# Patient Record
Sex: Female | Born: 1975
Health system: Southern US, Community
[De-identification: ages and names within clinical notes are randomized; demographics above are authoritative.]

## PROBLEM LIST (undated history)

## (undated) DIAGNOSIS — F419 Anxiety disorder, unspecified: Secondary | ICD-10-CM

## (undated) DIAGNOSIS — R002 Palpitations: Secondary | ICD-10-CM

## (undated) DIAGNOSIS — H9192 Unspecified hearing loss, left ear: Secondary | ICD-10-CM

## (undated) DIAGNOSIS — G5600 Carpal tunnel syndrome, unspecified upper limb: Secondary | ICD-10-CM

## (undated) DIAGNOSIS — F329 Major depressive disorder, single episode, unspecified: Secondary | ICD-10-CM

## (undated) DIAGNOSIS — F32A Depression, unspecified: Secondary | ICD-10-CM

## (undated) DIAGNOSIS — G250 Essential tremor: Secondary | ICD-10-CM

## (undated) DIAGNOSIS — M1990 Unspecified osteoarthritis, unspecified site: Secondary | ICD-10-CM

## (undated) DIAGNOSIS — N3946 Mixed incontinence: Secondary | ICD-10-CM

## (undated) DIAGNOSIS — D649 Anemia, unspecified: Secondary | ICD-10-CM

## (undated) HISTORY — DX: Essential tremor: G25.0

## (undated) HISTORY — DX: Carpal tunnel syndrome, unspecified upper limb: G56.00

## (undated) HISTORY — DX: Mixed incontinence: N39.46

## (undated) HISTORY — DX: Palpitations: R00.2

## (undated) HISTORY — DX: Unspecified osteoarthritis, unspecified site: M19.90

---
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
## 2004-12-28 ENCOUNTER — Encounter: Admission: RE | Admit: 2004-12-28 | Discharge: 2005-01-29 | Payer: Self-pay | Admitting: Occupational Medicine

## 2005-06-07 ENCOUNTER — Other Ambulatory Visit: Payer: Self-pay

## 2005-06-07 ENCOUNTER — Inpatient Hospital Stay: Payer: Self-pay | Admitting: Psychiatry

## 2005-07-18 ENCOUNTER — Ambulatory Visit: Payer: Self-pay | Admitting: Psychiatry

## 2005-07-26 ENCOUNTER — Ambulatory Visit: Payer: Self-pay | Admitting: Psychiatry

## 2006-07-24 ENCOUNTER — Ambulatory Visit (HOSPITAL_COMMUNITY): Admission: RE | Admit: 2006-07-24 | Discharge: 2006-07-24 | Payer: Self-pay | Admitting: Family Medicine

## 2012-04-17 LAB — URINALYSIS, COMPLETE
Bacteria: NONE SEEN
Bilirubin,UR: NEGATIVE
Glucose,UR: NEGATIVE mg/dL (ref 0–75)
Ketone: NEGATIVE
Ph: 7 (ref 4.5–8.0)
Protein: NEGATIVE
Specific Gravity: 1.01 (ref 1.003–1.030)
WBC UR: 5 /HPF (ref 0–5)

## 2012-04-17 LAB — COMPREHENSIVE METABOLIC PANEL
Anion Gap: 7 (ref 7–16)
BUN: 11 mg/dL (ref 7–18)
Bilirubin,Total: 0.7 mg/dL (ref 0.2–1.0)
Calcium, Total: 8.9 mg/dL (ref 8.5–10.1)
EGFR (African American): >60
Osmolality: 282 (ref 275–301)
Potassium: 3.9 mmol/L (ref 3.5–5.1)
SGOT(AST): 33 U/L (ref 15–37)
Sodium: 140 mmol/L (ref 136–145)

## 2012-04-17 LAB — SALICYLATE LEVEL: Salicylates, Serum: <1.7 mg/dL

## 2012-04-17 LAB — DRUG SCREEN, URINE
Cannabinoid 50 Ng, Ur ~~LOC~~: NEGATIVE (ref ?–50)
Cocaine Metabolite,Ur ~~LOC~~: NEGATIVE (ref ?–300)
Methadone, Ur Screen: NEGATIVE (ref ?–300)
Opiate, Ur Screen: NEGATIVE (ref ?–300)
Phencyclidine (PCP) Ur S: NEGATIVE (ref ?–25)
Tricyclic, Ur Screen: NEGATIVE (ref ?–1000)

## 2012-04-17 LAB — ETHANOL
Ethanol %: <0.003 % (ref 0.000–0.080)
Ethanol: <3 mg/dL

## 2012-04-17 LAB — CBC
HCT: 38.7 % (ref 35.0–47.0)
HGB: 12.8 g/dL (ref 12.0–16.0)
Platelet: 339 10*3/uL (ref 150–440)
RBC: 4.01 10*6/uL (ref 3.80–5.20)
WBC: 7.8 10*3/uL (ref 3.6–11.0)

## 2012-04-18 ENCOUNTER — Inpatient Hospital Stay: Payer: Self-pay | Admitting: Psychiatry

## 2012-04-19 LAB — URINALYSIS, COMPLETE
Bilirubin,UR: NEGATIVE
Ketone: NEGATIVE
Ph: 7 (ref 4.5–8.0)
Specific Gravity: 1.005 (ref 1.003–1.030)
WBC UR: 3 /HPF (ref 0–5)

## 2012-04-19 LAB — BEHAVIORAL MEDICINE 1 PANEL
Albumin: 3.3 g/dL — ABNORMAL LOW (ref 3.4–5.0)
Anion Gap: 5 — ABNORMAL LOW (ref 7–16)
BUN: 10 mg/dL (ref 7–18)
Basophil %: 0.8 %
Bilirubin,Total: 0.8 mg/dL (ref 0.2–1.0)
Chloride: 106 mmol/L (ref 98–107)
Co2: 29 mmol/L (ref 21–32)
Creatinine: 0.68 mg/dL (ref 0.60–1.30)
Eosinophil #: 0.1 10*3/uL (ref 0.0–0.7)
Eosinophil %: 1.7 %
Glucose: 88 mg/dL (ref 65–99)
Lymphocyte #: 2 10*3/uL (ref 1.0–3.6)
Lymphocyte %: 34.7 %
MCH: 32.6 pg (ref 26.0–34.0)
MCV: 97 fL (ref 80–100)
Neutrophil #: 3.2 10*3/uL (ref 1.4–6.5)
Neutrophil %: 55.8 %
Platelet: 293 10*3/uL (ref 150–440)
RBC: 3.88 10*6/uL (ref 3.80–5.20)
SGOT(AST): 20 U/L (ref 15–37)
Sodium: 140 mmol/L (ref 136–145)
Total Protein: 6.2 g/dL — ABNORMAL LOW (ref 6.4–8.2)

## 2012-09-15 ENCOUNTER — Ambulatory Visit: Payer: Self-pay | Admitting: Neurology

## 2013-04-08 DIAGNOSIS — G250 Essential tremor: Secondary | ICD-10-CM | POA: Insufficient documentation

## 2013-04-08 DIAGNOSIS — G5600 Carpal tunnel syndrome, unspecified upper limb: Secondary | ICD-10-CM | POA: Insufficient documentation

## 2013-04-08 DIAGNOSIS — F419 Anxiety disorder, unspecified: Secondary | ICD-10-CM | POA: Insufficient documentation

## 2014-04-15 NOTE — Patient Instructions (Signed)
Your procedure is scheduled on:  Friday, April 23, 2014  Enter through the Main Entrance of [facility] at:  6:00 a.m.  Pick up the phone at the desk and dial [phone].  Call this number if you have problems the morning of surgery: [phone].  Remember: Do NOT eat food or drink after:  Midnight Thursday  Take these medicines the morning of surgery with a SIP OF WATER:  Abilify, Primidone, Zoloft, Xanax if needed  *Stop all vitamins and herbal supplements at this time.  Do NOT wear jewelry (body piercing), metal hair clips/bobby pins, make-up, or nail polish. Do NOT wear lotions, powders, or perfumes.  You may wear deoderant. Do NOT shave for 48 hours prior to surgery. Do NOT bring valuables to the hospital. Contacts, dentures, or bridgework may not be worn into surgery.  Have a responsible adult drive you home and stay with you for 24 hours after your procedure

## 2014-04-16 ENCOUNTER — Encounter (HOSPITAL_COMMUNITY)
Admission: RE | Admit: 2014-04-16 | Discharge: 2014-04-16 | Disposition: A | Discharge status: Home / Self Care | Payer: 59 | Source: Ambulatory Visit | Attending: Obstetrics and Gynecology | Admitting: Obstetrics and Gynecology

## 2014-04-16 ENCOUNTER — Encounter (HOSPITAL_COMMUNITY): Payer: Self-pay

## 2014-04-16 DIAGNOSIS — Z01818 Encounter for other preprocedural examination: Secondary | ICD-10-CM | POA: Diagnosis not present

## 2014-04-16 HISTORY — DX: Unspecified hearing loss, left ear: H91.92

## 2014-04-16 HISTORY — DX: Anemia, unspecified: D64.9

## 2014-04-16 HISTORY — DX: Depression, unspecified: F32.A

## 2014-04-16 HISTORY — DX: Anxiety disorder, unspecified: F41.9

## 2014-04-16 LAB — CBC
HCT: 38.6 % (ref 36.0–46.0)
Hemoglobin: 13.1 g/dL (ref 12.0–15.0)
MCH: 32.2 pg (ref 26.0–34.0)
MCHC: 33.9 g/dL (ref 30.0–36.0)
MCV: 94.8 fL (ref 78.0–100.0)
Platelets: 368 10*3/uL (ref 150–400)
RBC: 4.07 MIL/uL (ref 3.87–5.11)
RDW: 12.4 % (ref 11.5–15.5)
WBC: 8.8 10*3/uL (ref 4.0–10.5)

## 2014-04-22 ENCOUNTER — Encounter (HOSPITAL_COMMUNITY): Payer: Self-pay | Admitting: Anesthesiology

## 2014-04-22 NOTE — Anesthesia Preprocedure Evaluation (Addendum)
Anesthesia Evaluation  Patient identified by MRN, date of birth, ID band Patient awake    Reviewed: Allergy & Precautions, NPO status , Patient's Chart, lab work & pertinent test results  Airway Mallampati: II  TM Distance: >3 FB Neck ROM: Full    Dental no notable dental hx. (+) Teeth Intact   Pulmonary neg pulmonary ROS,  breath sounds clear to auscultation  Pulmonary exam normal       Cardiovascular negative cardio ROS  Rhythm:Regular Rate:Normal     Neuro/Psych PSYCHIATRIC DISORDERS Anxiety Depression Deafness left ear    GI/Hepatic negative GI ROS, Neg liver ROS,   Endo/Other  negative endocrine ROS  Renal/GU negative Renal ROS  negative genitourinary   Musculoskeletal negative musculoskeletal ROS (+)   Abdominal   Peds  Hematology  (+) anemia ,   Anesthesia Other Findings   Reproductive/Obstetrics Desires Sterilization Indwelling IUD                           Anesthesia Physical Anesthesia Plan  ASA: II  Anesthesia Plan: General   Post-op Pain Management:    Induction: Intravenous  Airway Management Planned: Oral ETT  Additional Equipment:   Intra-op Plan:   Post-operative Plan: Extubation in OR  Informed Consent: I have reviewed the patients History and Physical, chart, labs and discussed the procedure including the risks, benefits and alternatives for the proposed anesthesia with the patient or authorized representative who has indicated his/her understanding and acceptance.   Dental advisory given  Plan Discussed with: Anesthesiologist, CRNA and Surgeon  Anesthesia Plan Comments:         Anesthesia Quick Evaluation

## 2014-04-22 NOTE — H&P (Signed)
Cynitha L Theone MurdochHeidel is an 39 y.o. female G1P1 with Mirena IUD desires permanent sterilization.  Pertinent Gynecological History: Menses: flow is light Bleeding: N/A Contraception: IUD DES exposure: denies Blood transfusions: none Sexually transmitted diseases: no past history Previous GYN Procedures: none  Last mammogram: normal Date: 2015 Last pap: normal Date: 2015 OB History: G1, P1   Menstrual History: Menarche age: unknown  No LMP recorded.    Past Medical History  Diagnosis Date  . Anxiety   . Depression   . Anemia   . Deafness in left ear     Past Surgical History  Procedure Laterality Date  . Axillary lymph node removal Right 1982    enlarged lymph node    No family history on file.  Social History:  reports that she has never smoked. She has never used smokeless tobacco. She reports that she does not drink alcohol or use illicit drugs.  Allergies:  Allergies  Allergen Reactions  . Latex Hives and Itching    No prescriptions prior to admission    Review of Systems  Constitutional: Negative for fever.    There were no vitals taken for this visit. Physical Exam  Cardiovascular: Normal rate and regular rhythm.   Respiratory: Effort normal and breath sounds normal.  GI: Soft. There is no tenderness.    No results found for this or any previous visit (from the past 24 hour(s)).  No results found.  Assessment/Plan: 39 yo G1P1 desires permanent sterilization L/S with BTL with Filshie clips and removal of IUD discussed Risks reviewed including infection, organ damage, bleeding/transfusion-HIV/Hep, DVT/PE, pneumonia, laparotomy, pelvic pain, abdominal pain, pain with intercourse. Alternate contraceptive methods, failure rate, increased ectopic risks and permanence discussed. All questions answered Patient states she understands and agrees  Caz Weaver II,Keone Kamer E 04/22/2014, 6:02 PM

## 2014-04-23 ENCOUNTER — Encounter (HOSPITAL_COMMUNITY)
Admission: RE | Disposition: A | Discharge status: Home / Self Care | Payer: Self-pay | Source: Ambulatory Visit | Attending: Obstetrics and Gynecology

## 2014-04-23 ENCOUNTER — Encounter (HOSPITAL_COMMUNITY): Payer: Self-pay | Admitting: *Deleted

## 2014-04-23 ENCOUNTER — Ambulatory Visit (HOSPITAL_COMMUNITY)
Admission: RE | Admit: 2014-04-23 | Discharge: 2014-04-23 | Disposition: A | Discharge status: Home / Self Care | Payer: 59 | Source: Ambulatory Visit | Attending: Obstetrics and Gynecology | Admitting: Obstetrics and Gynecology

## 2014-04-23 ENCOUNTER — Ambulatory Visit (HOSPITAL_COMMUNITY): Payer: 59 | Admitting: Anesthesiology

## 2014-04-23 DIAGNOSIS — Z91040 Latex allergy status: Secondary | ICD-10-CM | POA: Insufficient documentation

## 2014-04-23 DIAGNOSIS — Z302 Encounter for sterilization: Secondary | ICD-10-CM | POA: Insufficient documentation

## 2014-04-23 DIAGNOSIS — F329 Major depressive disorder, single episode, unspecified: Secondary | ICD-10-CM | POA: Diagnosis not present

## 2014-04-23 DIAGNOSIS — F419 Anxiety disorder, unspecified: Secondary | ICD-10-CM | POA: Insufficient documentation

## 2014-04-23 DIAGNOSIS — H9192 Unspecified hearing loss, left ear: Secondary | ICD-10-CM | POA: Diagnosis not present

## 2014-04-23 DIAGNOSIS — D649 Anemia, unspecified: Secondary | ICD-10-CM | POA: Diagnosis not present

## 2014-04-23 HISTORY — PX: LAPAROSCOPIC TUBAL LIGATION: SHX1937

## 2014-04-23 HISTORY — PX: IUD REMOVAL: SHX5392

## 2014-04-23 LAB — HCG, SERUM, QUALITATIVE: PREG SERUM: NEGATIVE

## 2014-04-23 SURGERY — LIGATION, FALLOPIAN TUBE, LAPAROSCOPIC
Anesthesia: General

## 2014-04-23 MED ORDER — BUPIVACAINE HCL (PF) 0.5 % IJ SOLN
INTRAMUSCULAR | Status: AC
  Filled 2014-04-23: qty 30

## 2014-04-23 MED ORDER — FENTANYL CITRATE (PF) 250 MCG/5ML IJ SOLN
INTRAMUSCULAR | Status: AC
  Filled 2014-04-23: qty 5

## 2014-04-23 MED ORDER — LIDOCAINE HCL (CARDIAC) 20 MG/ML IV SOLN
INTRAVENOUS | Status: DC | PRN
  Administered 2014-04-23: 80 mg via INTRAVENOUS

## 2014-04-23 MED ORDER — DEXAMETHASONE SODIUM PHOSPHATE 4 MG/ML IJ SOLN
INTRAMUSCULAR | Status: DC | PRN
  Administered 2014-04-23: 4 mg via INTRAVENOUS

## 2014-04-23 MED ORDER — MIDAZOLAM HCL 2 MG/2ML IJ SOLN
INTRAMUSCULAR | Status: DC | PRN
  Administered 2014-04-23: 2 mg via INTRAVENOUS

## 2014-04-23 MED ORDER — LIDOCAINE HCL (CARDIAC) 20 MG/ML IV SOLN
INTRAVENOUS | Status: AC
  Filled 2014-04-23: qty 5

## 2014-04-23 MED ORDER — NEOSTIGMINE METHYLSULFATE 10 MG/10ML IV SOLN
INTRAVENOUS | Status: AC
  Filled 2014-04-23: qty 1

## 2014-04-23 MED ORDER — OXYCODONE HCL 5 MG/5ML PO SOLN: 5.0000 mg | Freq: Once | ORAL | Status: DC | PRN

## 2014-04-23 MED ORDER — OXYCODONE-ACETAMINOPHEN 5-325 MG PO TABS
1.0000 | ORAL_TABLET | ORAL | Status: DC | PRN
  Administered 2014-04-23: 1 via ORAL

## 2014-04-23 MED ORDER — MEPERIDINE HCL 25 MG/ML IJ SOLN: 6.2500 mg | INTRAMUSCULAR | Status: DC | PRN

## 2014-04-23 MED ORDER — PROPOFOL 10 MG/ML IV BOLUS
INTRAVENOUS | Status: AC
  Filled 2014-04-23: qty 20

## 2014-04-23 MED ORDER — SCOPOLAMINE 1 MG/3DAYS TD PT72
1.0000 | MEDICATED_PATCH | Freq: Once | TRANSDERMAL | Status: DC
  Administered 2014-04-23: 1.5 mg via TRANSDERMAL

## 2014-04-23 MED ORDER — CEFAZOLIN SODIUM-DEXTROSE 2-3 GM-% IV SOLR
2.0000 g | INTRAVENOUS | Status: AC
  Administered 2014-04-23: 2 g via INTRAVENOUS

## 2014-04-23 MED ORDER — DEXAMETHASONE SODIUM PHOSPHATE 4 MG/ML IJ SOLN
INTRAMUSCULAR | Status: AC
Start: 2014-04-23 — End: 2014-04-23
  Filled 2014-04-23: qty 1

## 2014-04-23 MED ORDER — MIDAZOLAM HCL 2 MG/2ML IJ SOLN
INTRAMUSCULAR | Status: AC
  Filled 2014-04-23: qty 2

## 2014-04-23 MED ORDER — OXYCODONE HCL 5 MG PO TABS: 5.0000 mg | ORAL_TABLET | Freq: Once | ORAL | Status: DC | PRN

## 2014-04-23 MED ORDER — KETOROLAC TROMETHAMINE 30 MG/ML IJ SOLN
INTRAMUSCULAR | Status: AC
  Filled 2014-04-23: qty 1

## 2014-04-23 MED ORDER — LACTATED RINGERS IV SOLN
INTRAVENOUS | Status: DC
  Administered 2014-04-23 (×3): via INTRAVENOUS

## 2014-04-23 MED ORDER — OXYCODONE-ACETAMINOPHEN 5-325 MG PO TABS
ORAL_TABLET | ORAL | Status: AC
  Administered 2014-04-23: 1 via ORAL
  Filled 2014-04-23: qty 1

## 2014-04-23 MED ORDER — ONDANSETRON HCL 4 MG/2ML IJ SOLN
INTRAMUSCULAR | Status: AC
Start: 2014-04-23 — End: 2014-04-23
  Filled 2014-04-23: qty 2

## 2014-04-23 MED ORDER — ONDANSETRON HCL 4 MG/2ML IJ SOLN
INTRAMUSCULAR | Status: DC | PRN
  Administered 2014-04-23: 4 mg via INTRAVENOUS

## 2014-04-23 MED ORDER — LIDOCAINE HCL 1 % IJ SOLN
INTRAMUSCULAR | Status: AC
  Filled 2014-04-23: qty 20

## 2014-04-23 MED ORDER — PROPOFOL 10 MG/ML IV BOLUS
INTRAVENOUS | Status: DC | PRN
  Administered 2014-04-23: 200 mg via INTRAVENOUS

## 2014-04-23 MED ORDER — METOCLOPRAMIDE HCL 5 MG/ML IJ SOLN: 10.0000 mg | Freq: Once | INTRAMUSCULAR | Status: DC | PRN

## 2014-04-23 MED ORDER — KETOROLAC TROMETHAMINE 30 MG/ML IJ SOLN
INTRAMUSCULAR | Status: DC | PRN
  Administered 2014-04-23: 30 mg via INTRAVENOUS

## 2014-04-23 MED ORDER — SCOPOLAMINE 1 MG/3DAYS TD PT72
MEDICATED_PATCH | TRANSDERMAL | Status: AC
  Administered 2014-04-23: 1.5 mg via TRANSDERMAL
  Filled 2014-04-23: qty 1

## 2014-04-23 MED ORDER — ROCURONIUM BROMIDE 100 MG/10ML IV SOLN
INTRAVENOUS | Status: DC | PRN
  Administered 2014-04-23: 30 mg via INTRAVENOUS

## 2014-04-23 MED ORDER — BUPIVACAINE HCL (PF) 0.5 % IJ SOLN
INTRAMUSCULAR | Status: DC | PRN
  Administered 2014-04-23: 30 mL

## 2014-04-23 MED ORDER — CEFAZOLIN SODIUM-DEXTROSE 2-3 GM-% IV SOLR
INTRAVENOUS | Status: DC
Start: 2014-04-23 — End: 2014-04-23
  Filled 2014-04-23: qty 50

## 2014-04-23 MED ORDER — FENTANYL CITRATE (PF) 100 MCG/2ML IJ SOLN: 25.0000 ug | INTRAMUSCULAR | Status: DC | PRN

## 2014-04-23 MED ORDER — GLYCOPYRROLATE 0.2 MG/ML IJ SOLN
INTRAMUSCULAR | Status: AC
  Filled 2014-04-23: qty 3

## 2014-04-23 MED ORDER — ROCURONIUM BROMIDE 100 MG/10ML IV SOLN
INTRAVENOUS | Status: AC
  Filled 2014-04-23: qty 1

## 2014-04-23 MED ORDER — FENTANYL CITRATE (PF) 100 MCG/2ML IJ SOLN
INTRAMUSCULAR | Status: DC | PRN
  Administered 2014-04-23: 50 ug via INTRAVENOUS
  Administered 2014-04-23: 100 ug via INTRAVENOUS
  Administered 2014-04-23 (×2): 50 ug via INTRAVENOUS

## 2014-04-23 SURGICAL SUPPLY — 31 items
ADH SKN CLS APL DERMABOND .7 (GAUZE/BANDAGES/DRESSINGS) ×2
CATH ROBINSON RED A/P 16FR (CATHETERS) ×4 IMPLANT
CLIP FILSHIE TUBAL LIGA STRL (Clip) ×4 IMPLANT
CLOSURE WOUND 1/4 X3 (GAUZE/BANDAGES/DRESSINGS) ×1
CLOTH BEACON ORANGE TIMEOUT ST (SAFETY) ×4 IMPLANT
CONTAINER PREFILL 10% NBF 60ML (FORM) ×8 IMPLANT
DERMABOND ADVANCED (GAUZE/BANDAGES/DRESSINGS) ×2
DERMABOND ADVANCED .7 DNX12 (GAUZE/BANDAGES/DRESSINGS) IMPLANT
DRSG COVADERM PLUS 2X2 (GAUZE/BANDAGES/DRESSINGS) ×8 IMPLANT
DRSG OPSITE POSTOP 3X4 (GAUZE/BANDAGES/DRESSINGS) ×2 IMPLANT
GLOVE BIO SURGEON STRL SZ7.5 (GLOVE) ×8 IMPLANT
GLOVE BIOGEL PI IND STRL 8 (GLOVE) ×2 IMPLANT
GLOVE BIOGEL PI INDICATOR 8 (GLOVE) ×2
GOWN STRL REUS W/TWL LRG LVL3 (GOWN DISPOSABLE) ×8 IMPLANT
LIQUID BAND (GAUZE/BANDAGES/DRESSINGS) ×4 IMPLANT
NDL SPNL 22GX3.5 QUINCKE BK (NEEDLE) ×2 IMPLANT
NEEDLE INSUFFLATION 120MM (ENDOMECHANICALS) ×4 IMPLANT
NEEDLE SPNL 22GX3.5 QUINCKE BK (NEEDLE) ×4 IMPLANT
PACK LAPAROSCOPY BASIN (CUSTOM PROCEDURE TRAY) ×4 IMPLANT
PACK VAGINAL MINOR WOMEN LF (CUSTOM PROCEDURE TRAY) ×4 IMPLANT
PAD POSITIONER PINK NONSTERILE (MISCELLANEOUS) ×4 IMPLANT
PAD PREP 24X48 CUFFED NSTRL (MISCELLANEOUS) ×4 IMPLANT
STRIP CLOSURE SKIN 1/4X3 (GAUZE/BANDAGES/DRESSINGS) ×3 IMPLANT
SUT VIC AB 3-0 PS2 18 (SUTURE) ×4
SUT VIC AB 3-0 PS2 18XBRD (SUTURE) ×2 IMPLANT
SUT VICRYL 0 UR6 27IN ABS (SUTURE) ×2 IMPLANT
TOWEL OR 17X24 6PK STRL BLUE (TOWEL DISPOSABLE) ×8 IMPLANT
TROCAR XCEL NON-BLD 11X100MML (ENDOMECHANICALS) ×4 IMPLANT
TROCAR XCEL NON-BLD 5MMX100MML (ENDOMECHANICALS) ×4 IMPLANT
WARMER LAPAROSCOPE (MISCELLANEOUS) ×4 IMPLANT
WATER STERILE IRR 1000ML POUR (IV SOLUTION) ×4 IMPLANT

## 2014-04-23 NOTE — Brief Op Note (Signed)
04/23/2014  8:12 AM  PATIENT:  Carolyn [name]  39 y.o. female  PRE-OPERATIVE DIAGNOSIS:  desires sterilization  POST-OPERATIVE DIAGNOSIS:  desires sterilization  PROCEDURE:  Procedure(s): LAPAROSCOPIC TUBAL LIGATION with Filshie Clips (Bilateral) INTRAUTERINE DEVICE (IUD) REMOVAL (N/A)  SURGEON:  Surgeon(s) and Role:    * Harold HedgeJames Jamyla Ard, MD - Primary  PHYSICIAN ASSISTANT:   ASSISTANTS: none   ANESTHESIA:   general  EBL:  Total I/O In: 1000 [I.V.:1000] Out: 32 [Urine:30; Blood:2]  BLOOD ADMINISTERED:none  DRAINS: none   LOCAL MEDICATIONS USED:  MARCAINE    and Amount: 30 ml  SPECIMEN:  No Specimen  DISPOSITION OF SPECIMEN:  N/A  COUNTS:  YES  TOURNIQUET:  * No tourniquets in log *  DICTATION: .Other Dictation: Dictation Number [id number]  PLAN OF CARE: Discharge to home after PACU  PATIENT DISPOSITION:  PACU - hemodynamically stable.   Delay start of Pharmacological VTE agent (>24hrs) due to surgical blood loss or risk of bleeding: not applicable

## 2014-04-23 NOTE — Progress Notes (Signed)
No changes to H&P per patient history Reviewed with patient procedure-L/S, BTL with filshie clips, removal of IUD Patient states she understands and agrees. All questions answered

## 2014-04-23 NOTE — Op Note (Signed)
NAMEHanaa, Carolyn [name]                  ACCOUNT NO.:  [id number]  MEDICAL RECORD NO.:  [id number]  LOCATION:  [facility]                          FACILITY:  [facility]  PHYSICIAN:  Guy Sandifer. Henderson Cloud, M.D. DATE OF BIRTH:  1975-02-25  DATE OF PROCEDURE:  04/23/2014 DATE OF DISCHARGE:                              OPERATIVE REPORT   PREOPERATIVE DIAGNOSIS:  Desires permanent sterilization.  POSTOPERATIVE DIAGNOSIS:  Desires permanent sterilization.  PROCEDURE:  Laparoscopy with bilateral tubal ligation with Filshie clips and removal of IUD.  SURGEON:  Guy Sandifer. Henderson Cloud, M.D.  ANESTHESIA:  General endotracheal intubation.  ESTIMATED BLOOD LOSS:  Drops.  INDICATIONS AND CONSENT:  This patient is a 39 year old patient who desires permanent sterilization.  Alternate methods to contraception had been reviewed.  Potential risks and complications were reviewed including but not limited to, infection, organ damage, bleeding requiring transfusion of blood products with HIV and hepatitis acquisition, DVT, PE, pneumonia, pelvic pain, abdominal pain, and painful intercourse.  Permanence of the procedure, failure rate, and increased ectopic  risk are also reviewed.  All questions were answered. The patient states she understands and agrees.  Consent was signed on the chart.  FINDINGS:  Upper abdomen was grossly normal.  Pelvis and uterus were smooth and contoured.  Anterior and posterior cul-de-sacs were normal. The fallopian tubes were normal.  DESCRIPTION OF PROCEDURE:  The patient was taken to the operating room. She was identified, placed in dorsal supine position.  General anesthesia was induced via endotracheal intubation.  She was then placed in the dorsal lithotomy position.  Time-out undertaken.  She was prepped abdominally and vaginally.  Bladder straight catheterized.  Hulka tenaculum was placed in the uterus as a manipulator.  She was draped in a sterile fashion.  The infraumbilical and  suprapubic areas were injected in midline with approximately 6 mL of 0.5% plain Marcaine.  A small infraumbilical incision was made.  A disposable Veress needle was placed.  Good syringe and drop test were noted.  A 2 L of gas was then insufflated under low pressure with good tympany in the right upper quadrant.  Veress needle was removed and a 10/11 Xcel bladeless disposable trocar sleeve was placed using direct visualization with the diagnostic laparoscope.  The operative scope was then used.  A small suprapubic incision was made in the midline and a 5-mm disposable trocar sleeve was placed using direct visualization.  The above findings were noted.  The right fallopian tube was then grasped.  A Filshie clip was placed at the proximal 1/3 of the tube.  Similar procedure carried out on the left.  After removing the Hulka clip applicator, careful inspection revealed the full width of the tube to be in the clip and the heel of the clip visible through the mesosalpinx bilaterally.  Excellent hemostasis was noted.  The remaining approximately 23 mL of 0.5% plain Marcaine is instilled in the peritoneal cavity.  Suprapubic trocar sleeve was removed.  Pneumoperitoneum was reduced, and the umbilical trocar sleeve was removed.  The umbilicus was closed with a 0 Vicryl in the anterior fascial layer under good visualization.  Glue was used to close both incisions.  Hulka  tenaculum was removed, and no bleeding was noted.  All counts were correct.  The patient was awakened and taken to the recovery room in stable condition.     Guy SandiferJames E. Henderson Cloudomblin, M.D.     JET/MEDQ  D:  04/23/2014  T:  04/23/2014  Job:  [id number]

## 2014-04-23 NOTE — Discharge Instructions (Signed)
DISCHARGE INSTRUCTIONS: Laparoscopy  No Ibuprofen containing products until after 2:00 pm today.  The following instructions have been prepared to help you care for yourself upon your return home today.  Wound care:  Do not get the incisions wet for the first 24 hours. The incisions should be kept clean and dry.  Should the incision become sore, red, and swollen after the first week, check with your doctor.  Personal hygiene:  Shower the day after your procedure.  Activity and limitations:  Do NOT drive or operate any equipment today.  Do NOT lift anything more than 15 pounds for 2-3 weeks after surgery.  Do NOT rest in bed all day.  Walking is encouraged. Walk each day, starting slowly with 5-minute walks 3 or 4 times a day. Slowly increase the length of your walks.  Walk up and down stairs slowly.  Do NOT do strenuous activities, such as golfing, playing tennis, bowling, running, biking, weight lifting, gardening, mowing, or vacuuming for 2-4 weeks. Ask your doctor when it is okay to start.  Diet: Eat a light meal as desired this evening. You may resume your usual diet tomorrow.  Return to work: This is dependent on the type of work you do. For the most part you can return to a desk job within a week of surgery. If you are more active at work, please discuss this with your doctor.  What to expect after your surgery: You may have a slight burning sensation when you urinate on the first day. You may have a very small amount of blood in the urine. Expect to have a small amount of vaginal discharge/light bleeding for 1-2 weeks. It is not unusual to have abdominal soreness and bruising for up to 2 weeks. You may be tired and need more rest for about 1 week. You may experience shoulder pain for 24-72 hours. Lying flat in bed may relieve it.  Call your doctor for any of the following:  Develop a fever of 100.4 or greater  Inability to urinate 6 hours after discharge from  hospital  Severe pain not relieved by pain medications  Persistent of heavy bleeding at incision site  Redness or swelling around incision site after a week  Increasing nausea or vomiting  Patient Signature________________________________________ Nurse Signature_________________________________________

## 2014-04-23 NOTE — Transfer of Care (Signed)
Immediate Anesthesia Transfer of Care Note  Patient: Carolyn [name]  Procedure(s) Performed: Procedure(s): LAPAROSCOPIC TUBAL LIGATION with Filshie Clips (Bilateral) INTRAUTERINE DEVICE (IUD) REMOVAL (N/A)  Patient Location: PACU  Anesthesia Type:General  Level of Consciousness: awake, alert , oriented and patient cooperative  Airway & Oxygen Therapy: Patient Spontanous Breathing and Patient connected to nasal cannula oxygen  Post-op Assessment: Report given to RN and Post -op Vital signs reviewed and stable  Post vital signs: Reviewed and stable  Last Vitals:  Filed Vitals:   04/23/14 0611  BP: 118/63  Pulse: 94  Temp: 37.4 C  Resp: 20    Complications: No apparent anesthesia complications

## 2014-04-23 NOTE — Anesthesia Postprocedure Evaluation (Signed)
Anesthesia Post Note  Patient: Carolyn [name]  Procedure(s) Performed: Procedure(s) (LRB): LAPAROSCOPIC TUBAL LIGATION with Filshie Clips (Bilateral) INTRAUTERINE DEVICE (IUD) REMOVAL (N/A)  Anesthesia type: General  Patient location: PACU  Post pain: Pain level controlled  Post assessment: Post-op Vital signs reviewed  Last Vitals:  Filed Vitals:   04/23/14 0845  BP: 115/50  Pulse: 86  Temp:   Resp: 17    Post vital signs: Reviewed  Level of consciousness: sedated  Complications: No apparent anesthesia complications

## 2014-04-23 NOTE — Anesthesia Procedure Notes (Signed)
Procedure Name: Intubation Date/Time: 04/23/2014 7:36 AM Performed by: Collier FlowersSPEIGHT, Carolyn [name] Pre-anesthesia Checklist: Patient identified, Emergency Drugs available, Suction available, Patient being monitored and Timeout performed Patient Re-evaluated:Patient Re-evaluated prior to inductionOxygen Delivery Method: Circle system utilized Preoxygenation: Pre-oxygenation with 100% oxygen Intubation Type: IV induction Ventilation: Mask ventilation without difficulty Laryngoscope Size: Mac and 4 Grade View: Grade I Tube type: Oral Tube size: 7.0 mm Number of attempts: 1 Airway Equipment and Method: Stylet Placement Confirmation: ETT inserted through vocal cords under direct vision,  positive ETCO2 and breath sounds checked- equal and bilateral Secured at: 21 (lips) cm Tube secured with: 21. Dental Injury: Teeth and Oropharynx as per pre-operative assessment

## 2014-04-26 ENCOUNTER — Encounter (HOSPITAL_COMMUNITY): Payer: Self-pay | Admitting: Obstetrics and Gynecology

## 2014-04-30 NOTE — H&P (Signed)
PATIENT NAME:  Carolyn [name] MR#:  [id number] DATE OF BIRTH:  03-14-1975  DATE OF ADMISSION:  04/17/2012  IDENTIFYING STATION AND CHIEF COMPLAINT: A 39 year old woman brought to the Emergency Room because of a suicide attempt with overdose on Xanax.   CHIEF COMPLAINT: "I'm just depressed."   HISTORY OF PRESENT ILLNESS: Information obtained from the patient and the chart. Yesterday, she took an overdose of Xanax which she had around the house. She came down and told her husband about it, says that she did not want to live anymore. The patient tells me that she has been feeling increasingly depressed, although it will come and go week to week. Last week she had a reasonably good week, but this week she is feeling really down. Cries all the time. Energy low. Chronic difficulty sleeping. Increased suicidal ideation. Denies psychotic symptoms. She has been compliant with her medication which is currently Pristiq 50 mg twice a day and clonazepam 1 mg 3 times a day p.r.n. for anxiety. She has been going to see her therapist. Recent stress includes difficulty at work. She describes her job as being very stressful.   PAST PSYCHIATRIC HISTORY: Says that she has had depression ever since she was a teenager or younger. She has had two previous psychiatric hospitalizations. She does have a past history of suicide attempts. Has been on multiple medications. She names multiple antidepressants, as well as lithium, Risperdal, Abilify, Seroquel; none of which she said had had sustained a benefit. She will often feel better for a brief period of time, but then her symptoms get worse. She is currently taking Pristiq 50 mg twice a day.   SUBSTANCE ABUSE HISTORY: Denies that she has ever had a serious alcohol or drug problem. She will occasionally drink when she is feeling bad, but it has not turned into a major problem. Denies use of any other drugs.   PAST MEDICAL HISTORY: No significant ongoing medical problems.   SOCIAL  HISTORY: Lives with her husband. They have an 83 year old son who is currently not living at home. She and her husband do the same kind of work, working for the [employer]. She finds quite stressful.   REVIEW OF SYSTEMS: Depressed mood, tearfulness, low energy, lack of motivation, lack of interest, lack of enjoyment suicidal thoughts. No hallucinations.   MENTAL STATUS EXAMINATION: Slightly disheveled woman, looks her stated age or younger. Cooperative with the interview. Eye contact good. Psychomotor activity slow. Speech is extremely slow and halting. Thoughts are halting with what seems to be thought blocking. Frequent tearfulness in her affect, very down and depressed looking. Mood stated as depressed and anxious. Denies auditory or visual hallucinations. Endorses positive suicidal thoughts. No homicidal ideation. Judgment and insight recently impaired. Short and long-term memory grossly intact. Normal intelligence.  PHYSICAL EXAMINATION: Does not appear to be in any acute physical distress.  SKIN: No skin lesions.  HEENT: Pupils equal and reactive. Face symmetric. Oral mucosa normal.  NECK AND BACK: Nontender.  MUSCULOSKELETAL: Full range of motion at all extremities. Normal gait. Strength and reflexes normal and symmetric throughout. Cranial nerves normal and symmetric.  LUNGS: Clear with no wheezes.  HEART: Regular rate and rhythm.  ABDOMEN: Soft, nontender, normal bowel sounds VITAL SIGNS:  Most recent, pulse 84, respirations 20, blood pressure 103/61.   LABORATORY RESULTS: Chemistries just showed an elevated glucose at 159 in an nonfasting draw. CBC normal. Alcohol not detected. Salicylates and acetaminophen not detected. TSH normal at 0.9. Urinalysis borderline. Drug  screen positive for benzodiazepines. Pregnancy test negative.   ASSESSMENT: A 39 year old woman who has a history of severe, recurrent major depression and has had a worsening with a serious suicide attempt.  She is very tearful, depressed, down and also has a history of anxiety symptoms. Has not responded well to multiple medicines. Needs hospitalization because of dangerousness.   TREATMENT PLAN: Admit the patient to psychiatry. Increase the dose of Pristiq to 100 mg twice a day. Continue clonazepam. Engage her in individual and group psychotherapy. I told her that we may want to consider ECT.   DIAGNOSIS, PRINCIPAL AND PRIMARY:   AXIS I: Major depression, severe, recurrent.   SECONDARY DIAGNOSES: AXIS I: Rule out obsessive-compulsive disorder.   AXIS II: Deferred.   AXIS III: No diagnosis.   AXIS IV: Moderate to severe chronic family stress.   AXIS V: Functioning at time of evaluation 25.    ____________________________ Carolyn AmelJohn T. Avia Merkley, MD jtc:cc D: 04/18/2012 16:21:17 ET T: 04/18/2012 16:38:25 ET JOB#: [id number]  cc: Carolyn AmelJohn T. Carolyn Payment, MD, <Dictator> Carolyn AmelJOHN T Jermario Kalmar MD ELECTRONICALLY SIGNED 04/20/2012 11:28

## 2014-04-30 NOTE — Consult Note (Signed)
Brief Consult Note: Diagnosis: major depression severe recurrent.   Patient was seen by consultant.   Recommend further assessment or treatment.   Orders entered.   Discussed with Attending MD.   Comments: Psychiatry: Patient seen. Chaert reviewed. Major depression. Suicide attempt. Admit. See full note.  Electronic Signatures: Audery Amellapacs, John T (MD)  (Signed 11-Apr-14 16:10)  Authored: Brief Consult Note   Last Updated: 11-Apr-14 16:10 by Audery Amellapacs, John T (MD)

## 2014-04-30 NOTE — Discharge Summary (Signed)
PATIENT NAME:  [name], [name] MR#:  [id number] DATE OF BIRTH:  06-21-1975  DATE OF ADMISSION:  04/18/2012 DATE OF DISCHARGE:  04/22/2012  HOSPITAL COURSE: See dictated history and physical for details of admission. A 39 year old woman admitted after taking an overdose on Xanax. The patient has chronic, severe depression with recent worsening. In the hospital, she was showing symptoms of depression, was tearful, withdrawn, but did make an effort to participate in groups. She was compliant with medication treatment for her depression. She was treated with Pristiq  with the dose elevated to 100 mg b.i.d. as well as Risperdal 0.5 mg at bedtime. Clonazepam was kept to 1 mg in the morning and low dose p.r.n. if needed. I offered the patient the opportunity for ECT because of her history of chronic severe, recurrent depression. She considered it, but ultimately chose not to pursue this. At the time of discharge, she was not endorsing suicidal ideation, did not appear to be psychotic and was agreeable to outpatient treatment. She felt like her husband was supportive of her. She will follow up with her psychiatrist in the community at [facility].   DISCHARGE MEDICATIONS: Pristiq 100 mg b.i.d., Risperdal 0.5 mg at bedtime, Lunesta 3 mg p.o. at bedtime, Klonopin 0.5 mg q. 6 p.r.n. for anxiety, 1 mg p.o. q.a.m.   LABORATORY RESULTS: Admission labs included chemistry with an elevated glucose at 159 on a nonfasting draw, otherwise normal. CBC unremarkable. Alcohol undetected. Salicylates and acetaminophen undetected. TSH normal at 0.9. Urinalysis borderline. Drug screen positive for benzodiazepines. Pregnancy test negative. Sinus tachycardia.   DISCHARGE MENTAL STATUS EXAMINATION: Neatly dressed and groomed woman, looks her stated age. Cooperative with the interview. Good eye contact. Normal psychomotor activity. Speech quiet, but normal in amount. Affect slightly blunted but not tearful. Mood stated as being  better. Thoughts are lucid without loosening of associations. No evidence of delusions or paranoia. Denies auditory or visual hallucinations. Denies suicidal or homicidal ideation. Shows improved judgment and insight. Normal intelligence.   DISPOSITION: Discharge home with her family.   FOLLOW-UP: At [facility].   DIAGNOSIS, PRINCIPAL AND PRIMARY:   AXIS I: Major depression, severe, recurrent.   SECONDARY DIAGNOSES: AXIS I: Benzodiazepine abuse.   AXIS II: Deferred.   AXIS III: No diagnosis.   AXIS IV: Severe from ongoing stress from her job and her illness.   AXIS V: Functioning at time of discharge 58.    ____________________________ Audery AmelJohn T. Clapacs, MD jtc:cc D: 05/08/2012 17:41:31 ET T: 05/08/2012 22:19:30 ET JOB#: [id number]  cc: Audery AmelJohn T. Clapacs, MD, <Dictator> Audery AmelJOHN T CLAPACS MD ELECTRONICALLY SIGNED 05/09/2012 11:49

## 2014-07-29 ENCOUNTER — Encounter: Payer: Self-pay | Admitting: Family Medicine

## 2014-07-29 ENCOUNTER — Ambulatory Visit (INDEPENDENT_AMBULATORY_CARE_PROVIDER_SITE_OTHER): Payer: 59 | Admitting: Family Medicine

## 2014-07-29 ENCOUNTER — Other Ambulatory Visit: Payer: Self-pay

## 2014-07-29 ENCOUNTER — Other Ambulatory Visit: Payer: Self-pay | Admitting: Family Medicine

## 2014-07-29 VITALS — BP 114/74 | HR 96 | Temp 99.6°F | Resp 16 | Ht 69.0 in | Wt 189.0 lb

## 2014-07-29 DIAGNOSIS — Z7189 Other specified counseling: Secondary | ICD-10-CM

## 2014-07-29 DIAGNOSIS — D649 Anemia, unspecified: Secondary | ICD-10-CM | POA: Insufficient documentation

## 2014-07-29 DIAGNOSIS — N926 Irregular menstruation, unspecified: Secondary | ICD-10-CM | POA: Diagnosis not present

## 2014-07-29 DIAGNOSIS — Z7689 Persons encountering health services in other specified circumstances: Secondary | ICD-10-CM

## 2014-07-29 DIAGNOSIS — R0602 Shortness of breath: Secondary | ICD-10-CM

## 2014-07-29 DIAGNOSIS — R600 Localized edema: Secondary | ICD-10-CM | POA: Diagnosis not present

## 2014-07-29 DIAGNOSIS — Z8249 Family history of ischemic heart disease and other diseases of the circulatory system: Secondary | ICD-10-CM

## 2014-07-29 DIAGNOSIS — R002 Palpitations: Secondary | ICD-10-CM

## 2014-07-29 NOTE — Patient Instructions (Addendum)
We will have you seen by cardiology to evaluate your symptoms. We will also have some breathing studies done to determine if your symptoms are coming from the lungs.   IF you develop severe shortness of breath, chest pain, palpitations, or feel as though you will pass out, please seek immediate medical attention.   Palpitations A palpitation is the feeling that your heartbeat is irregular or is faster than normal. It may feel like your heart is fluttering or skipping a beat. Palpitations are usually not a serious problem. However, in some cases, you may need further medical evaluation. CAUSES  Palpitations can be caused by:  Smoking.  Caffeine or other stimulants, such as diet pills or energy drinks.  Alcohol.  Stress and anxiety.  Strenuous physical activity.  Fatigue.  Certain medicines.  Heart disease, especially if you have a history of irregular heart rhythms (arrhythmias), such as atrial fibrillation, atrial flutter, or supraventricular tachycardia.  An improperly working pacemaker or defibrillator. DIAGNOSIS  To find the cause of your palpitations, your health care provider will take your medical history and perform a physical exam. Your health care provider may also have you take a test called an ambulatory electrocardiogram (ECG). An ECG records your heartbeat patterns over a 24-hour period. You may also have other tests, such as:  Transthoracic echocardiogram (TTE). During echocardiography, sound waves are used to evaluate how blood flows through your heart.  Transesophageal echocardiogram (TEE).  Cardiac monitoring. This allows your health care provider to monitor your heart rate and rhythm in real time.  Holter monitor. This is a portable device that records your heartbeat and can help diagnose heart arrhythmias. It allows your health care provider to track your heart activity for several days, if needed.  Stress tests by exercise or by giving medicine that makes the  heart beat faster. TREATMENT  Treatment of palpitations depends on the cause of your symptoms and can vary greatly. Most cases of palpitations do not require any treatment other than time, relaxation, and monitoring your symptoms. Other causes, such as atrial fibrillation, atrial flutter, or supraventricular tachycardia, usually require further treatment. HOME CARE INSTRUCTIONS   Avoid:  Caffeinated coffee, tea, soft drinks, diet pills, and energy drinks.  Chocolate.  Alcohol.  Stop smoking if you smoke.  Reduce your stress and anxiety. Things that can help you relax include:  A method of controlling things in your body, such as your heartbeats, with your mind (biofeedback).  Yoga.  Meditation.  Physical activity such as swimming, jogging, or walking.  Get plenty of rest and sleep. SEEK MEDICAL CARE IF:   You continue to have a fast or irregular heartbeat beyond 24 hours.  Your palpitations occur more often. SEEK IMMEDIATE MEDICAL CARE IF:  You have chest pain or shortness of breath.  You have a severe headache.  You feel dizzy or you faint. MAKE SURE YOU:  Understand these instructions.  Will watch your condition.  Will get help right away if you are not doing well or get worse. Document Released: 12/23/1999 Document Revised: 12/30/2012 Document Reviewed: 02/23/2011 [facility] Patient Information 2015 [place], [place]. This information is not intended to replace advice given to you by your health care provider. Make sure you discuss any questions you have with your health care provider.

## 2014-07-29 NOTE — Assessment & Plan Note (Signed)
CBC and anemia panel today.

## 2014-07-29 NOTE — Progress Notes (Signed)
Subjective:    Patient ID: Carolyn [name], female    DOB: [birth year], 38 y.o.   MRN: [id number]  HPI: Carolyn [name] is a 39 y.o. female presenting on 07/29/2014 for Establish Care   HPI  Pt presents to re-establish care. She was a patient here > 3 years ago. No other PCP between visits. She is currently managed by Dr. Bethanie Dicker for her psychiatric needs. She sees her OB-GYN (Dr. Huntley [facility] at [facility]) on a yearly basis. Current medical problems: Anxiety/depression: Managed by psychiatry. Anemia: Pt is always slightly anemic on her CBC. Unsure if iron related. Not currently taking iron.  Osteopenia: Takes multivitamin. Last DXA 1 week ago.   Pt reports she has missed a period- she had a recent tubal ligation in April. 2 periods since then, she was due on July 15 but has not started her cycle. Has not taken a pregnancy test at home due to recent tubal ligation.   Pt is concerned about 6 mos worth of exertional shortness of breath and ankle swelling.  Ankle swelling goes away when elevates feet. Pt is not on her feet for her job. Rare use of naproxen. Shortness of breath occurs mainly with exertion but she has experienced it at rest. Shortness of breath is accompanied by palpitations. Denies chest pain or pre-syncope.   Last Pap: 2016- had 1 abnormal, normal since.  Last mammogram 2016. No regular exercise.   Past Medical History  Diagnosis Date  . Anxiety   . Depression   . Anemia   . Deafness in left ear   . Arthritis     osteopenia   History   Social History  . Marital Status: Married    Spouse Name: N/A  . Number of Children: N/A  . Years of Education: N/A   Occupational History  . Not on file.   Social History Main Topics  . Smoking status: Never Smoker   . Smokeless tobacco: Never Used  . Alcohol Use: No  . Drug Use: No  . Sexual Activity: Yes    Birth Control/ Protection: Surgical   Other Topics Concern  . Not on file   Social History Narrative   Family  History  Problem Relation Age of Onset  . Depression Mother   . Arthritis Mother     osteoprosis  . Heart disease Father   . Heart disease Paternal Grandmother   . Cancer Paternal Grandmother     breast cancer   Current Outpatient Prescriptions on File Prior to Visit  Medication Sig  . acetaminophen (TYLENOL) 500 MG tablet Take 1,000 mg by mouth every 6 (six) hours as needed for headache.  . ALPHA LIPOIC ACID PO Take 1 tablet by mouth daily.  Marland Kitchen ALPRAZolam (XANAX) 0.5 MG tablet Take 0.5 mg by mouth daily as needed for anxiety.  . ARIPiprazole (ABILIFY) 5 MG tablet Take 7.5 mg by mouth daily.  Marland Kitchen ibuprofen (ADVIL,MOTRIN) 200 MG tablet Take 400 mg by mouth every 6 (six) hours as needed for headache.  . Multiple Vitamins-Minerals (MULTIVITAMIN PO) Take 1 tablet by mouth daily.  . primidone (MYSOLINE) 50 MG tablet Take 100 mg by mouth 2 (two) times daily.  . sertraline (ZOLOFT) 100 MG tablet Take 200 mg by mouth daily.  . traZODone (DESYREL) 100 MG tablet Take 100 mg by mouth at bedtime.  Marland Kitchen loperamide (IMODIUM) 2 MG capsule Take 2 mg by mouth as needed for diarrhea or loose stools.   No current facility-administered medications  on file prior to visit.    Review of Systems  Constitutional: Negative for fever and chills.  HENT: Negative.   Eyes: Negative for visual disturbance.  Respiratory: Positive for shortness of breath (on exertion- during exercise.). Negative for cough, chest tightness and wheezing.   Cardiovascular: Positive for palpitations (occur when winded.) and leg swelling (ankle swelling.). Negative for chest pain.  Gastrointestinal: Negative for nausea, vomiting, abdominal pain, diarrhea and constipation.  Endocrine: Negative.  Negative for cold intolerance, heat intolerance, polydipsia, polyphagia and polyuria.  Genitourinary: Positive for menstrual problem (missed period 2 weeks late. Recent tubal ligation). Negative for dysuria, flank pain and difficulty urinating.   Musculoskeletal: Negative.   Allergic/Immunologic: Negative.   Neurological: Negative for dizziness, light-headedness and numbness.  Psychiatric/Behavioral: Negative.    Per HPI unless specifically indicated above     Objective:    BP 114/74 mmHg  Pulse 96  Temp(Src) 99.6 F (37.6 C) (Oral)  Resp 16  Ht  (1.753 m)  Wt 189 lb (85.73 kg)  BMI 27.90 kg/m2  LMP 06/23/2014  Wt Readings from Last 3 Encounters:  07/29/14 189 lb (85.73 kg)  04/16/14 192 lb (87.091 kg)    Physical Exam  Constitutional: She is oriented to person, place, and time. She appears well-developed and well-nourished. No distress.  HENT:  Head: Normocephalic and atraumatic.  Neck: Normal range of motion. Neck supple. No thyromegaly present.  Cardiovascular: Normal rate, regular rhythm, S1 normal, S2 normal and normal pulses.  PMI is not displaced.  Exam reveals no gallop and no friction rub.   No murmur heard. Pulmonary/Chest: Effort normal and breath sounds normal. No respiratory distress. She has no wheezes. She exhibits no tenderness.  Abdominal: Soft. Bowel sounds are normal. There is no tenderness. There is no rebound.  Musculoskeletal: She exhibits no edema.  Lymphadenopathy:    She has no cervical adenopathy.  Neurological: She is alert and oriented to person, place, and time.  Skin: Skin is warm and dry. No rash noted. She is not diaphoretic. No erythema.  Psychiatric: She has a normal mood and affect. Her behavior is normal. Judgment and thought content normal.   Results for orders placed or performed during the hospital encounter of 04/23/14  hCG, serum, qualitative  Result Value Ref Range   Preg, Serum NEGATIVE NEGATIVE      Assessment & Plan:   Problem List Items Addressed This Visit      Other   Anemia    CBC and anemia panel today.       Relevant Orders   CBC with Differential   Vitamin B12   Folate   Iron and TIBC   Ferritin    Other Visit Diagnoses    Encounter to  establish care    -  Primary    Palpitations        EKG normal today. Anxiety vs deconditioning. Pt would like cardiology referral  due to family history. RTC 2 weeks- consider holter monitor if symptoms persist.    Relevant Orders    EKG 12-Lead    Ambulatory referral to Cardiology    Shortness of breath        EKG today. R/o cardiac and pulmonary cause. Likely deconditioning.     Relevant Orders    EKG 12-Lead    Comprehensive Metabolic Panel (CMET)    Pulmonary function test    Ambulatory referral to Cardiology    Pedal edema        Likely dependent edema.  Check Labs. TSH done by GYN. Compression and elevation recommended.     Family history of heart disease        Lipid panel to stratify risk factors.     Relevant Orders    Lipid Profile    Missed period        Pt decline in office pregnancy test today. Likely irregular due to recent tubal ligation in April.        Meds ordered this encounter  Medications  . cyclobenzaprine (FLEXERIL) 10 MG tablet    Sig: Take by mouth.      Follow up plan: Return in about 2 weeks (around 08/12/2014).

## 2014-07-30 ENCOUNTER — Encounter: Payer: Self-pay | Admitting: Physician Assistant

## 2014-07-30 ENCOUNTER — Other Ambulatory Visit
Admission: RE | Admit: 2014-07-30 | Discharge: 2014-07-30 | Disposition: A | Discharge status: Home / Self Care | Payer: 59 | Source: Intra-hospital | Attending: Family Medicine | Admitting: Family Medicine

## 2014-07-30 DIAGNOSIS — H9192 Unspecified hearing loss, left ear: Secondary | ICD-10-CM | POA: Insufficient documentation

## 2014-07-30 DIAGNOSIS — F3342 Major depressive disorder, recurrent, in full remission: Secondary | ICD-10-CM | POA: Insufficient documentation

## 2014-07-30 DIAGNOSIS — D649 Anemia, unspecified: Secondary | ICD-10-CM | POA: Diagnosis present

## 2014-07-30 DIAGNOSIS — R002 Palpitations: Secondary | ICD-10-CM | POA: Insufficient documentation

## 2014-07-30 LAB — COMPREHENSIVE METABOLIC PANEL
ALT: 14 U/L (ref 14–54)
AST: 19 U/L (ref 15–41)
Albumin: 4.1 g/dL (ref 3.5–5.0)
Alkaline Phosphatase: 57 U/L (ref 38–126)
Anion gap: 8 (ref 5–15)
BUN: 15 mg/dL (ref 6–20)
CALCIUM: 9.3 mg/dL (ref 8.9–10.3)
CO2: 26 mmol/L (ref 22–32)
CREATININE: 0.66 mg/dL (ref 0.44–1.00)
Chloride: 104 mmol/L (ref 101–111)
GFR calc Af Amer: >60 mL/min (ref >60)
GFR calc non Af Amer: >60 mL/min (ref >60)
Glucose, Bld: 123 mg/dL — ABNORMAL HIGH (ref 65–99)
Potassium: 3.9 mmol/L (ref 3.5–5.1)
Sodium: 138 mmol/L (ref 135–145)
TOTAL PROTEIN: 6.8 g/dL (ref 6.5–8.1)
Total Bilirubin: 0.8 mg/dL (ref 0.3–1.2)

## 2014-07-30 LAB — LIPID PANEL
CHOLESTEROL: 188 mg/dL (ref 0–200)
HDL: 53 mg/dL (ref >40)
LDL CALC: 90 mg/dL (ref 0–99)
Total CHOL/HDL Ratio: 3.5 RATIO
Triglycerides: 223 mg/dL — ABNORMAL HIGH (ref <150)
VLDL: 45 mg/dL — ABNORMAL HIGH (ref 0–40)

## 2014-07-30 LAB — IRON AND TIBC
Iron: 153 ug/dL (ref 28–170)
Saturation Ratios: 51 % — ABNORMAL HIGH (ref 10.4–31.8)
TIBC: 302 ug/dL (ref 250–450)
UIBC: 149 ug/dL

## 2014-07-30 LAB — CBC WITH DIFFERENTIAL/PLATELET
BASOS ABS: 0 10*3/uL (ref 0–0.1)
Basophils Relative: 1 %
Eosinophils Absolute: 0.2 10*3/uL (ref 0–0.7)
Eosinophils Relative: 3 %
HCT: 38.8 % (ref 35.0–47.0)
HEMOGLOBIN: 12.9 g/dL (ref 12.0–16.0)
LYMPHS PCT: 34 %
Lymphs Abs: 2.5 10*3/uL (ref 1.0–3.6)
MCH: 31.6 pg (ref 26.0–34.0)
MCHC: 33.3 g/dL (ref 32.0–36.0)
MCV: 94.7 fL (ref 80.0–100.0)
MONOS PCT: 6 %
Monocytes Absolute: 0.4 10*3/uL (ref 0.2–0.9)
NEUTROS ABS: 4.1 10*3/uL (ref 1.4–6.5)
NEUTROS PCT: 56 %
Platelets: 299 10*3/uL (ref 150–440)
RBC: 4.1 MIL/uL (ref 3.80–5.20)
RDW: 12.4 % (ref 11.5–14.5)
WBC: 7.2 10*3/uL (ref 3.6–11.0)

## 2014-07-30 LAB — VITAMIN B12: Vitamin B-12: 356 pg/mL (ref 180–914)

## 2014-07-30 LAB — FOLATE: Folate: 34 ng/mL (ref >5.9)

## 2014-07-30 LAB — FERRITIN: Ferritin: 41 ng/mL (ref 11–307)

## 2014-08-03 ENCOUNTER — Other Ambulatory Visit: Payer: Self-pay | Admitting: Family Medicine

## 2014-08-03 ENCOUNTER — Encounter: Payer: Self-pay | Admitting: Cardiovascular Disease

## 2014-08-03 ENCOUNTER — Ambulatory Visit (INDEPENDENT_AMBULATORY_CARE_PROVIDER_SITE_OTHER): Payer: 59 | Admitting: Cardiovascular Disease

## 2014-08-03 ENCOUNTER — Ambulatory Visit: Payer: 59 | Admitting: Physician Assistant

## 2014-08-03 VITALS — BP 108/76 | HR 83 | Ht 69.5 in | Wt 189.0 lb

## 2014-08-03 DIAGNOSIS — R0602 Shortness of breath: Secondary | ICD-10-CM

## 2014-08-03 DIAGNOSIS — R600 Localized edema: Secondary | ICD-10-CM

## 2014-08-03 DIAGNOSIS — R002 Palpitations: Secondary | ICD-10-CM

## 2014-08-03 NOTE — Progress Notes (Signed)
Primary care provider: Malashia Krebs, NP  HPI  This is a pleasant 39 year old female who was referred for evaluation of shortness of breath and leg edema. She has no previous cardiac history. She has no significant chronic medical conditions. She does have significant anxiety currently being managed by psychiatry. She reports progressive exertional dyspnea over the last year with no orthopnea, or PND.Marland Kitchen She has occasional palpitations . She complains of leg edema which is usually worse at the end of the day. She does not exercise on a regular basis. She reports that the palpitations are not on a daily basis and overall not happening frequently. There has been no syncope or presyncope. Family history is remarkable for coronary artery disease. Her father had myocardial infarction in his 56s. She is a lifelong smoker.  Allergies  Allergen Reactions  . Latex Hives, Itching and Rash    sensitivity     Current Outpatient Prescriptions on File Prior to Visit  Medication Sig Dispense Refill  . acetaminophen (TYLENOL) 500 MG tablet Take 1,000 mg by mouth every 6 (six) hours as needed for headache.    . ALPHA LIPOIC ACID PO Take 600 mg by mouth daily.     Marland Kitchen ALPRAZolam (XANAX) 0.5 MG tablet Take 0.5 mg by mouth daily as needed for anxiety.    . ARIPiprazole (ABILIFY) 5 MG tablet Take 7.5 mg by mouth daily.    . cyclobenzaprine (FLEXERIL) 10 MG tablet Take by mouth as needed.     Marland Kitchen ibuprofen (ADVIL,MOTRIN) 200 MG tablet Take 400 mg by mouth every 6 (six) hours as needed for headache.    . Multiple Vitamins-Minerals (MULTIVITAMIN PO) Take 1 tablet by mouth daily.    . primidone (MYSOLINE) 50 MG tablet Take 100 mg by mouth 2 (two) times daily.    . sertraline (ZOLOFT) 100 MG tablet Take 200 mg by mouth daily.    . traZODone (DESYREL) 100 MG tablet Take 100 mg by mouth at bedtime.     No current facility-administered medications on file prior to visit.     Past Medical History  Diagnosis Date  .  Anxiety   . Depression   . Anemia     a. low-normal H/H  . Deafness in left ear   . Arthritis     osteopenia  . Palpitations   . Benign essential tremor     a. bilateral upper and lower extremities; b. followed by [facility]; c. felt to be 2/2 antidepressants   . Carpal tunnel syndrome      Past Surgical History  Procedure Laterality Date  . Axillary lymph node removal Right 1982    enlarged lymph node  . Laparoscopic tubal ligation Bilateral 04/23/2014    Procedure: LAPAROSCOPIC TUBAL LIGATION with Filshie Clips;  Surgeon: Harold Hedge, MD;  Location: [facility];  Service: Gynecology;  Laterality: Bilateral;  . Iud removal N/A 04/23/2014    Procedure: INTRAUTERINE DEVICE (IUD) REMOVAL;  Surgeon: Harold Hedge, MD;  Location: [facility];  Service: Gynecology;  Laterality: N/A;     Family History  Problem Relation Age of Onset  . Depression Mother     bipolar disorder  . Arthritis Mother     osteoprosis  . Heart disease Father   . Heart attack Father 43  . Heart disease Paternal Grandmother   . Cancer Paternal Grandmother     breast cancer     History   Social History  . Marital Status: Married    Spouse Name: N/A  .  Number of Children: N/A  . Years of Education: N/A   Occupational History  . Not on file.   Social History Main Topics  . Smoking status: Never Smoker   . Smokeless tobacco: Never Used  . Alcohol Use: No  . Drug Use: No  . Sexual Activity: Yes    Birth Control/ Protection: Surgical   Other Topics Concern  . Not on file   Social History Narrative     ROS A 10 point review of system was performed. It is negative other than that mentioned in the history of present illness.   PHYSICAL EXAM   BP 108/76 mmHg  Pulse 83  Ht 5' 9.5" (1.765 m)  Wt 189 lb (85.73 kg)  BMI 27.52 kg/m2  LMP 06/23/2014 Constitutional: She is oriented to person, place, and time. She appears well-developed and well-nourished. No distress.  HENT: No nasal discharge.  Head:  Normocephalic and atraumatic.  Eyes: Pupils are equal and round. No discharge.  Neck: Normal range of motion. Neck supple. No JVD present. No thyromegaly present.  Cardiovascular: Normal rate, regular rhythm, normal heart sounds. Exam reveals no gallop and no friction rub. No murmur heard.  Pulmonary/Chest: Effort normal and breath sounds normal. No stridor. No respiratory distress. She has no wheezes. She has no rales. She exhibits no tenderness.  Abdominal: Soft. Bowel sounds are normal. She exhibits no distension. There is no tenderness. There is no rebound and no guarding.  Musculoskeletal: Normal range of motion. She exhibits no edema and no tenderness.  Neurological: She is alert and oriented to person, place, and time. Coordination normal.  Skin: Skin is warm and dry. No rash noted. She is not diaphoretic. No erythema. No pallor.  Psychiatric: She has a normal mood and affect. Her behavior is normal. Judgment and thought content normal.     [id number]  Rhythm  WITHIN NORMAL LIMITS   ASSESSMENT AND PLAN

## 2014-08-03 NOTE — Assessment & Plan Note (Signed)
I suspect that this is likely due to physical deconditioning. However, we have to exclude structural heart abnormalities, cardiomyopathy or pulmonary hypertension. Thus, I requested an echocardiogram for evaluation. She has minimal risk factors for coronary artery disease and there is currently no indication for ischemic workup.

## 2014-08-03 NOTE — Patient Instructions (Signed)
Medication Instructions:  Your physician recommends that you continue on your current medications as directed. Please refer to the Current Medication list given to you today.   Labwork: none  Testing/Procedures: Your physician has requested that you have an echocardiogram. Echocardiography is a painless test that uses sound waves to create images of your heart. It provides your doctor with information about the size and shape of your heart and how well your heart's chambers and valves are working. This procedure takes approximately one hour. There are no restrictions for this procedure.    Follow-Up: Your physician recommends that you schedule a follow-up appointment with Dr. Kirke Corin as needed.    Any Other Special Instructions Will Be Listed Below (If Applicable).  Echocardiogram An echocardiogram, or echocardiography, uses sound waves (ultrasound) to produce an image of your heart. The echocardiogram is simple, painless, obtained within a short period of time, and offers valuable information to your health care provider. The images from an echocardiogram can provide information such as:  Evidence of coronary artery disease (CAD).  Heart size.  Heart muscle function.  Heart valve function.  Aneurysm detection.  Evidence of a past heart attack.  Fluid buildup around the heart.  Heart muscle thickening.  Assess heart valve function. LET [facility] CARE PROVIDER KNOW ABOUT:  Any allergies you have.  All medicines you are taking, including vitamins, herbs, eye drops, creams, and over-the-counter medicines.  Previous problems you or members of your family have had with the use of anesthetics.  Any blood disorders you have.  Previous surgeries you have had.  Medical conditions you have.  Possibility of pregnancy, if this applies. BEFORE THE PROCEDURE  No special preparation is needed. Eat and drink normally.  PROCEDURE   In order to produce an image of your heart,  gel will be applied to your chest and a wand-like tool (transducer) will be moved over your chest. The gel will help transmit the sound waves from the transducer. The sound waves will harmlessly bounce off your heart to allow the heart images to be captured in real-time motion. These images will then be recorded.  You may need an IV to receive a medicine that improves the quality of the pictures. AFTER THE PROCEDURE You may return to your normal schedule including diet, activities, and medicines, unless your health care provider tells you otherwise. Document Released: 12/23/1999 Document Revised: 05/11/2013 Document Reviewed: 09/01/2012 [facility] Patient Information 2015 [place], [place]. This information is not intended to replace advice given to you by your health care provider. Make sure you discuss any questions you have with your health care provider.

## 2014-08-03 NOTE — Assessment & Plan Note (Signed)
Palpitations are overall mild and not happening on a daily basis. Thus, the utility of Holter monitor is low. If symptoms become more frequent, further monitoring can be ordered.

## 2014-08-03 NOTE — Assessment & Plan Note (Signed)
This seems to be minimal today by physical exam. I suspect that this is likely due to chronic venous insufficiency. I advised her to elevate her legs during the day and if this worsens, knee-high support stockings can be considered.

## 2014-08-05 ENCOUNTER — Ambulatory Visit: Payer: 59 | Attending: Family Medicine

## 2014-08-05 DIAGNOSIS — R0602 Shortness of breath: Secondary | ICD-10-CM | POA: Insufficient documentation

## 2014-08-05 MED ORDER — ALBUTEROL SULFATE (2.5 MG/3ML) 0.083% IN NEBU
2.5000 mg | INHALATION_SOLUTION | Freq: Once | RESPIRATORY_TRACT | Status: AC
  Administered 2014-08-05: 2.5 mg via RESPIRATORY_TRACT
  Filled 2014-08-05: qty 3

## 2014-08-10 ENCOUNTER — Encounter: Payer: Self-pay | Admitting: Family Medicine

## 2014-08-10 ENCOUNTER — Ambulatory Visit (INDEPENDENT_AMBULATORY_CARE_PROVIDER_SITE_OTHER): Payer: 59 | Admitting: Family Medicine

## 2014-08-10 VITALS — BP 110/74 | HR 101 | Temp 99.3°F | Resp 16 | Ht 69.0 in | Wt 188.6 lb

## 2014-08-10 DIAGNOSIS — R110 Nausea: Secondary | ICD-10-CM | POA: Diagnosis not present

## 2014-08-10 DIAGNOSIS — R0602 Shortness of breath: Secondary | ICD-10-CM | POA: Diagnosis not present

## 2014-08-10 LAB — MM DIGITAL SCREENING BILATERAL

## 2014-08-10 MED ORDER — ALBUTEROL SULFATE HFA 108 (90 BASE) MCG/ACT IN AERS: 2.0000 | INHALATION_SPRAY | Freq: Four times a day (QID) | RESPIRATORY_TRACT | Status: DC | PRN

## 2014-08-10 MED ORDER — RANITIDINE HCL 150 MG PO TABS: 150.0000 mg | ORAL_TABLET | Freq: Two times a day (BID) | ORAL | Status: DC

## 2014-08-10 NOTE — Progress Notes (Signed)
Subjective:    Patient ID: Carolyn [name], female    DOB: 01/20/1975, 39 y.o.   MRN: [id number]  HPI: Carolyn [name] is a 39 y.o. female presenting on 08/10/2014 for Follow-up   HPI  Pt presents to follow-up on shortness of breath. Mostly exertional. Has not had palpitations since last visit. Seen by cardiology- cardiac cause ruled out. Will have echo on Friday. Cards suspects physical deconditioning.   Past Medical History  Diagnosis Date  . Anxiety   . Depression   . Anemia     a. low-normal H/H  . Deafness in left ear   . Arthritis     osteopenia  . Palpitations   . Benign essential tremor     a. bilateral upper and lower extremities; b. followed by [facility]; c. felt to be 2/2 antidepressants   . Carpal tunnel syndrome     Current Outpatient Prescriptions on File Prior to Visit  Medication Sig  . acetaminophen (TYLENOL) 500 MG tablet Take 1,000 mg by mouth every 6 (six) hours as needed for headache.  . ALPHA LIPOIC ACID PO Take 600 mg by mouth daily.   Marland Kitchen ALPRAZolam (XANAX) 0.5 MG tablet Take 0.5 mg by mouth daily as needed for anxiety.  . ARIPiprazole (ABILIFY) 5 MG tablet Take 7.5 mg by mouth daily.  . cyclobenzaprine (FLEXERIL) 10 MG tablet Take by mouth as needed.   Marland Kitchen ibuprofen (ADVIL,MOTRIN) 200 MG tablet Take 400 mg by mouth every 6 (six) hours as needed for headache.  . Multiple Vitamins-Minerals (MULTIVITAMIN PO) Take 1 tablet by mouth daily.  . primidone (MYSOLINE) 50 MG tablet Take 100 mg by mouth 2 (two) times daily.  . sertraline (ZOLOFT) 100 MG tablet Take 200 mg by mouth daily.  . traZODone (DESYREL) 100 MG tablet Take 100 mg by mouth at bedtime.   No current facility-administered medications on file prior to visit.    Review of Systems  Constitutional: Negative for fever and chills.  HENT: Negative.   Respiratory: Positive for shortness of breath (2-3 times per week with exertion). Negative for chest tightness.   Cardiovascular: Negative for chest pain,  palpitations and leg swelling.  Gastrointestinal: Positive for nausea (occasional on empty stomach). Negative for abdominal pain and abdominal distention.  Genitourinary: Negative.   Musculoskeletal: Negative.   Neurological: Negative for dizziness, numbness and headaches.  Psychiatric/Behavioral: Negative.    Per HPI unless specifically indicated above     Objective:    BP 110/74 mmHg  Pulse 101  Temp(Src) 99.3 F (37.4 C) (Oral)  Resp 16  Ht 5\' 9"  (1.753 m)  Wt 188 lb 9.6 oz (85.548 kg)  BMI 27.84 kg/m2  LMP 06/23/2014  Wt Readings from Last 3 Encounters:  08/10/14 188 lb 9.6 oz (85.548 kg)  08/03/14 189 lb (85.73 kg)  07/29/14 189 lb (85.73 kg)    Physical Exam  Constitutional: She is oriented to person, place, and time. She appears well-developed and well-nourished. No distress.  Neck: Normal range of motion. Neck supple. No thyromegaly present.  Cardiovascular: Normal rate and regular rhythm.  Exam reveals no gallop and no friction rub.   No murmur heard. Pulmonary/Chest: Effort normal and breath sounds normal.  Abdominal: Soft. Bowel sounds are normal. There is no tenderness. There is no rebound.  Musculoskeletal: Normal range of motion. She exhibits no edema or tenderness.  Lymphadenopathy:    She has no cervical adenopathy.  Neurological: She is alert and oriented to person, place, and time.  Skin: Skin is warm and dry. She is not diaphoretic.   Results for orders placed or performed during the hospital encounter of 07/30/14  CBC with Differential/Platelet  Result Value Ref Range   WBC 7.2 3.6 - 11.0 K/uL   RBC 4.10 3.80 - 5.20 MIL/uL   Hemoglobin 12.9 12.0 - 16.0 g/dL   HCT 16.1 09.6 - 04.5 %   MCV 94.7 80.0 - 100.0 fL   MCH 31.6 26.0 - 34.0 pg   MCHC 33.3 32.0 - 36.0 g/dL   RDW 40.9 81.1 - 91.4 %   Platelets 299 150 - 440 K/uL   Neutrophils Relative % 56 %   Neutro Abs 4.1 1.4 - 6.5 K/uL   Lymphocytes Relative 34 %   Lymphs Abs 2.5 1.0 - 3.6 K/uL    Monocytes Relative 6 %   Monocytes Absolute 0.4 0.2 - 0.9 K/uL   Eosinophils Relative 3 %   Eosinophils Absolute 0.2 0 - 0.7 K/uL   Basophils Relative 1 %   Basophils Absolute 0.0 0 - 0.1 K/uL  Comprehensive metabolic panel  Result Value Ref Range   Sodium 138 135 - 145 mmol/L   Potassium 3.9 3.5 - 5.1 mmol/L   Chloride 104 101 - 111 mmol/L   CO2 26 22 - 32 mmol/L   Glucose, Bld 123 (H) 65 - 99 mg/dL   BUN 15 6 - 20 mg/dL   Creatinine, Ser 7.82 0.44 - 1.00 mg/dL   Calcium 9.3 8.9 - 95.6 mg/dL   Total Protein 6.8 6.5 - 8.1 g/dL   Albumin 4.1 3.5 - 5.0 g/dL   AST 19 15 - 41 U/L   ALT 14 14 - 54 U/L   Alkaline Phosphatase 57 38 - 126 U/L   Total Bilirubin 0.8 0.3 - 1.2 mg/dL   GFR calc non Af Amer >60 >60 mL/min   GFR calc Af Amer >60 >60 mL/min   Anion gap 8 5 - 15  Lipid panel  Result Value Ref Range   Cholesterol 188 0 - 200 mg/dL   Triglycerides 213 (H) <150 mg/dL   HDL 53 >08 mg/dL   Total CHOL/HDL Ratio 3.5 RATIO   VLDL 45 (H) 0 - 40 mg/dL   LDL Cholesterol 90 0 - 99 mg/dL  Vitamin M57  Result Value Ref Range   Vitamin B-12 356 180 - 914 pg/mL  Folate  Result Value Ref Range   Folate 34.0 >5.9 ng/mL  Iron and TIBC  Result Value Ref Range   Iron 153 28 - 170 ug/dL   TIBC 846 962 - 952 ug/dL   Saturation Ratios 51 (H) 10.4 - 31.8 %   UIBC 149 ug/dL  Ferritin  Result Value Ref Range   Ferritin 41 11 - 307 ng/mL      Assessment & Plan:   Problem List Items Addressed This Visit      Other   Shortness of breath - Primary    Spirometry negative. Continue with echo from cardiology. Likely deconditioning. Encouraged increasing activity. PRN inhaler. Consider metacholine challenge or work-up for GERD induced asthma with GI.      Relevant Medications   albuterol (PROVENTIL HFA;VENTOLIN HFA) 108 (90 BASE) MCG/ACT inhaler    Other Visit Diagnoses    Nausea        Possibly GERD induced. Or hormone induced. Recommend supportive measures. Trial of Zantac.         Meds ordered this encounter  Medications  . albuterol (PROVENTIL HFA;VENTOLIN HFA) 108 (  90 BASE) MCG/ACT inhaler    Sig: Inhale 2 puffs into the lungs every 6 (six) hours as needed for wheezing or shortness of breath.    Dispense:  1 Inhaler    Refill:  0    Order Specific Question:  Supervising Provider    Answer:  Janeann Forehand [[id number]]  . ranitidine (ZANTAC) 150 MG tablet    Sig: Take 1 tablet (150 mg total) by mouth 2 (two) times daily.    Order Specific Question:  Supervising Provider    Answer:  Janeann Forehand [[id number]]      Follow up plan: Return if symptoms worsen or fail to improve.

## 2014-08-10 NOTE — Assessment & Plan Note (Signed)
Spirometry negative. Continue with echo from cardiology. Likely deconditioning. Encouraged increasing activity. PRN inhaler. Consider metacholine challenge or work-up for GERD induced asthma with GI.

## 2014-08-10 NOTE — Patient Instructions (Signed)
Let's try and inhaler with your shortness of breath.  It may be deconditioning- try slowly increasing your activity level. Aim for 15 min one day increase slowly to 30-40 minutes per day.  You can take 2 puffs of an inhaler as needed for shortness of breath.  Let me know if you shortness of breath increases and we will, continue to work it up. We will wait on the results of you echo from cardiology.

## 2014-08-13 ENCOUNTER — Ambulatory Visit (INDEPENDENT_AMBULATORY_CARE_PROVIDER_SITE_OTHER): Payer: 59

## 2014-08-13 ENCOUNTER — Other Ambulatory Visit: Payer: Self-pay

## 2014-08-13 DIAGNOSIS — R0602 Shortness of breath: Secondary | ICD-10-CM

## 2014-10-18 ENCOUNTER — Ambulatory Visit: Payer: 59 | Attending: Orthopedic Surgery

## 2014-10-18 DIAGNOSIS — R202 Paresthesia of skin: Secondary | ICD-10-CM

## 2014-10-18 DIAGNOSIS — M25512 Pain in left shoulder: Secondary | ICD-10-CM

## 2014-10-18 DIAGNOSIS — R200 Anesthesia of skin: Secondary | ICD-10-CM

## 2014-10-19 NOTE — Therapy (Addendum)
[facility] MAIN [facility] SERVICES [address] [place], [place], [address] Phone: [phone]   Fax:  [phone]  Physical Therapy Evaluation  Patient Details  Name: Carolyn [name] MRN: [id number] Date of Birth: 10-31-1975 Referring Provider:  Kennedy Bucker, MD  Encounter Date: 10/18/2014      PT End of Session - 10/19/14 1258    Visit Number 1   Number of Visits 9   Date for PT Re-Evaluation 11/15/14   PT Start Time 1730   PT Stop Time 1830   PT Time Calculation (min) 60 min   Activity Tolerance Patient tolerated treatment well   Behavior During Therapy [facility] for tasks assessed/performed      Past Medical History  Diagnosis Date  . Anxiety   . Depression   . Anemia     a. low-normal H/H  . Deafness in left ear   . Arthritis     osteopenia  . Palpitations   . Benign essential tremor     a. bilateral upper and lower extremities; b. followed by [facility]; c. felt to be 2/2 antidepressants   . Carpal tunnel syndrome     Past Surgical History  Procedure Laterality Date  . Axillary lymph node removal Right 1982    enlarged lymph node  . Laparoscopic tubal ligation Bilateral 04/23/2014    Procedure: LAPAROSCOPIC TUBAL LIGATION with Filshie Clips;  Surgeon: Harold Hedge, MD;  Location: [facility];  Service: Gynecology;  Laterality: Bilateral;  . Iud removal N/A 04/23/2014    Procedure: INTRAUTERINE DEVICE (IUD) REMOVAL;  Surgeon: Harold Hedge, MD;  Location: [facility];  Service: Gynecology;  Laterality: N/A;    There were no vitals filed for this visit.  Visit Diagnosis:  Left shoulder pain  Numbness and tingling in left arm      Subjective Assessment - 10/19/14 1257    Subjective Pt reports she has been having left shoulder pain for ~ 3 months with gradual progression until last week when it started feeling better after her MD visit.  Her MD believes she might have a labral tear and wants pt to receive PT to strengthen rotator cuff.  Her  x-rays did not show anything significant and her MD wants her to follow-up if she is still having pain after Thanksgiving. Pt notes she starts having increased "achy" pain with prolonged sitting, where she feels it in the "joint."   She also has pain with sitting on couch and leans her elbow on a pillow.  Sher shoulder "aches all the time" and has had some pain relief with ice, naproxen and ibuprofen.  She also reports occasional numbness/tingling sensation in her arm down to her hand and has not noticed a pattern to her radicular symptoms.  Pt is also notes increased upper left trap pain due to constant "guarding."  Her worst pain occurs in the evenings when sitting with 8/10 pain and her best is 1/10 which occurs when she is busy.  Pt reports she has left shoulder popping/clicking which has been occurring prior to shoulder symptoms.   Currently in Pain? Yes   Pain Score 2    Pain Location Shoulder   Pain Orientation Left   Pain Descriptors / Indicators Aching            [facility] PT Assessment - 10/19/14 1416    Assessment   Medical Diagnosis left shoulder derangement    Onset Date/Surgical Date 07/19/14   Hand Dominance Right   Prior Therapy none  Precautions   Precautions None   Restrictions   Weight Bearing Restrictions No   Balance Screen   Has the patient fallen in the past 6 months No   Has the patient had a decrease in activity level because of a fear of falling?  No   Is the patient reluctant to leave their home because of a fear of falling?  No   Home [employer] information centre manager residence   Living Arrangements Spouse/significant other   Available Help at Discharge Family   Type of Home House   Home Access Stairs to enter   Entrance Stairs-Number of Steps 5   Entrance Stairs-Rails None   Home Layout Two level   Alternate Level Stairs-Number of Steps 12   Alternate Level Stairs-Rails Left   Home Equipment None   Prior Function   Level of Independence  Independent   Vocation Full time employment  surgery scheduler   Vocation Requirements sitting and typing   Leisure knit, ride motorcycles   Cognition   Overall Cognitive Status Within Functional Limits for tasks assessed   Sensation   Light Touch Appears Intact   Coordination   Gross Motor Movements are Fluid and Coordinated Yes       PAIN: 2/10 "achy" pain in left shoulder  POSTURE: Rounded shoulders, forward head, slump sitting  AROM: Bilateral shoulder AROM: equal in all planes and WNL and without increase in pain  Cervical ROM: WFL in all planes and without increase in pain  STRENGTH:  Graded on a 0-5 scale Muscle Group Left Right  Shoulder flex 4 4  Shoulder Abd 4+ 4+      Shoulder IR/ER 4 4  Elbow flexion 4+ 4+  Wrist/hand 4+ 4+       SENSATION: UE intact to light touch   UE Myotome and Dermatomes: WNL  Reflex: Biceps: absent on right and +1 on left Brachioradialis :absent on right and +1 on left  Triceps: absent on right and +1 on left   SPECIAL TESTS: Neer:+ on left Hawkins-Kennedy= + on left O'Brians: negative on left Labral Crank: negative  Bicep load test: negative  Palpation: Patient is tender in left coracoid process and increased biceps tone on left, reproduces similar shoulder pain     There ex: Educated pt on lumbar roll to improve posture to improve shoulder ROM, decrease stress on spine Scapular retractions x10 Left biceps stretch 2x30 sec    OUTCOME MEASURES: TEST Outcome Interpretation                         PT Education - 10/19/14 1258    Education provided Yes   Education Details plan of care and HEP   Person(s) Educated Patient   Methods Explanation   Comprehension Verbalized understanding             PT Long Term Goals - 10/19/14 1430    PT LONG TERM GOAL #1   Title pt will report less than 4/10 in her shoulder in the evening when knitting   Baseline worst pain in the evening up to 8/10    Time 4   Period Weeks   Status New   PT LONG TERM GOAL #2   Title pt will be able to sit for at least 30 min with less than 2/10 achy pain while typing at work   Baseline constant achy pain in left shoulder    Time 4   Period Weeks  Status New   PT LONG TERM GOAL #3   Title pt will be independent with HEP to manage symptoms at home.    Baseline pt required cueing for correct HEp technique   Time 4   Period Weeks   Status New               Plan - 10/19/14 1329    Clinical Impression Statement Pt is a pleasant 39 year old female with 3 month history of left shoulder pain and LE numbness/tingling.  Pt presents with equal bilateral shoulder AROM with no increase in pain.  Based on her exam and history, pt presents with signs and symptoms consistent with left shoulder impingement, increased biceps tone.  Pt would benefit from skilled PT services to decrease shoulder pain and to address possible cervical involvement regarding her LE radicular symptoms and address postural dysfunction.     Pt will benefit from skilled therapeutic intervention in order to improve on the following deficits Postural dysfunction;Pain;Impaired flexibility;Hypomobility   Rehab Potential Good   PT Frequency 2x / week   PT Duration 4 weeks   PT Treatment/Interventions Electrical Stimulation;Iontophoresis /ml Dexamethasone;Cryotherapy;Moist Heat;Traction;Therapeutic exercise;Therapeutic activities;Manual techniques;Patient/family education;Neuromuscular re-education;Passive range of motion;Dry needling         Problem List Patient Active Problem List   Diagnosis Date Noted  . Shortness of breath 08/03/2014  . Leg edema 08/03/2014  . Palpitations   . Depression   . Deafness in left ear   . Anemia 07/29/2014  . Anxiety 04/08/2013  . Benign essential tremor 04/08/2013  . Carpal tunnel syndrome 04/08/2013  . Clinical depression 04/08/2013   Janus Molder, SPT This entire session was performed  under direct supervision and direction of a licensed therapist/therapist assistant . I have personally read, edited and approve of the note as written. Carlyon Shadow. Tortorici, PT, DPT [phone]  Tortorici,Ashley 10/20/2014, 1:09 PM  [facility] MAIN [facility] SERVICES [address] [place], [place], [address] Phone: [phone]   Fax:  [phone]

## 2014-10-19 NOTE — Patient Instructions (Signed)
[email or web address] Lumbar roll  Left biceps stretch 3x30 sec Scapular retractions 2x10

## 2014-10-20 NOTE — Addendum Note (Signed)
Addended by: Abelardo DieselTORICI, Kailee Essman C on: 10/20/2014 01:11 PM   Modules accepted: Orders

## 2014-10-21 ENCOUNTER — Ambulatory Visit: Payer: 59

## 2014-10-25 ENCOUNTER — Ambulatory Visit: Payer: 59 | Admitting: Physical Therapy

## 2014-10-26 ENCOUNTER — Ambulatory Visit: Payer: 59

## 2014-10-26 DIAGNOSIS — M25512 Pain in left shoulder: Secondary | ICD-10-CM

## 2014-10-26 DIAGNOSIS — R200 Anesthesia of skin: Secondary | ICD-10-CM

## 2014-10-26 DIAGNOSIS — R202 Paresthesia of skin: Secondary | ICD-10-CM

## 2014-10-27 NOTE — Therapy (Addendum)
[facility] MAIN [facility] SERVICES [address] [place], [place], [address] Phone: [phone]   Fax:  [phone]  Physical Therapy Treatment  Patient Details  Name: Carolyn [name] MRN: [id number] Date of Birth: 10/06/1975 No Data Recorded  Encounter Date: 10/26/2014      PT End of Session - 10/27/14 1237    Visit Number 2   Number of Visits 9   Date for PT Re-Evaluation 11/15/14   PT Start Time 1730   PT Stop Time 1815   PT Time Calculation (min) 45 min   Activity Tolerance Patient tolerated treatment well   Behavior During Therapy [facility] for tasks assessed/performed      Past Medical History  Diagnosis Date  . Anxiety   . Depression   . Anemia     a. low-normal H/H  . Deafness in left ear   . Arthritis     osteopenia  . Palpitations   . Benign essential tremor     a. bilateral upper and lower extremities; b. followed by [facility]; c. felt to be 2/2 antidepressants   . Carpal tunnel syndrome     Past Surgical History  Procedure Laterality Date  . Axillary lymph node removal Right 1982    enlarged lymph node  . Laparoscopic tubal ligation Bilateral 04/23/2014    Procedure: LAPAROSCOPIC TUBAL LIGATION with Filshie Clips;  Surgeon: Harold Hedge, MD;  Location: [facility];  Service: Gynecology;  Laterality: Bilateral;  . Iud removal N/A 04/23/2014    Procedure: INTRAUTERINE DEVICE (IUD) REMOVAL;  Surgeon: Harold Hedge, MD;  Location: [facility];  Service: Gynecology;  Laterality: N/A;    There were no vitals filed for this visit.  Visit Diagnosis:  Left shoulder pain  Numbness and tingling in left arm      Subjective Assessment - 10/27/14 1236    Subjective Pt reports she has not had much pain since her last visit and no pain in the last two days. Pt denies any pain currently. Pt notes she is able to perform all her functional activities at home and work without pain.     Currently in Pain? No/denies   Pain Score 0-No pain      There  ex: Repeated cervical flexion x10, no change  Static cervical flexion x1 min, no change  Seated cervical retractions x10, slight increase tension in her neck Seated cervical retractions with extension x10, no change Supine chin tucks x10, decreased tension in her neck UE neuro tension test: Median: negative Ulnar: negative Radial: negative Standing shoulder internal/external rotation with red band and towel roll 2x10 Bilateral shoulder extension with red band 2x10 Bilateral shoulder low row with red band 2x10 Wall slide with lift 2x10 Pt required verbal cueing to maintain upright posture during standing exercises versus rounded shoulders  Pt required verbal and visual cueing for correct exercise technique                           PT Education - 10/27/14 1237    Education provided Yes   Education Details plan of care and HEP progression    Person(s) Educated Patient   Methods Explanation   Comprehension Verbalized understanding             PT Long Term Goals - 10/19/14 1430    PT LONG TERM GOAL #1   Title pt will report less than 4/10 in her shoulder in the evening when knitting  Baseline worst pain in the evening up to 8/10   Time 4   Period Weeks   Status New   PT LONG TERM GOAL #2   Title pt will be able to sit for at least 30 min with less than 2/10 achy pain while typing at work   Baseline constant achy pain in left shoulder    Time 4   Period Weeks   Status New   PT LONG TERM GOAL #3   Title pt will be independent with HEP to manage symptoms at home.    Baseline pt required cueing for correct HEp technique   Time 4   Period Weeks   Status New               Plan - 10/27/14 1238    Clinical Impression Statement Pt is doing really well since her last session and reports no pain in the past two days.  Pt did not experience any pain throughout session today and will be placed on hold for two weeks incase her symptoms return,  otherwise pt will be discharged from PT with an HEP to maintain and improve gains made in PT.     Pt will benefit from skilled therapeutic intervention in order to improve on the following deficits Postural dysfunction;Pain;Impaired flexibility;Hypomobility   Rehab Potential Good   PT Frequency 2x / week   PT Duration 4 weeks   PT Treatment/Interventions Electrical Stimulation;Iontophoresis 4mg /ml Dexamethasone;Cryotherapy;Moist Heat;Traction;Therapeutic exercise;Therapeutic activities;Manual techniques;Patient/family education;Neuromuscular re-education;Passive range of motion;Dry needling        Problem List Patient Active Problem List   Diagnosis Date Noted  . Shortness of breath 08/03/2014  . Leg edema 08/03/2014  . Palpitations   . Depression   . Deafness in left ear   . Anemia 07/29/2014  . Anxiety 04/08/2013  . Benign essential tremor 04/08/2013  . Carpal tunnel syndrome 04/08/2013  . Clinical depression 04/08/2013   Carolyn [name], SPT This entire session was performed under direct supervision and direction of a licensed therapist/therapist assistant . I have personally read, edited and approve of the note as written. Carlyon ShadowAshley C. Tortorici, PT, DPT [phone]  Tortorici,Ashley 10/27/2014, 4:49 PM  [facility] [address] [place], [place], [address] Phone: [phone]   Fax:  [phone]  Name: Carolyn [name] MRN: [id number] Date of Birth: 02/01/1975

## 2014-10-27 NOTE — Patient Instructions (Signed)
[email or web address] Standing shoulder internal/external rotation with red band and towel roll 2x10 Bilateral shoulder extension with red band 2x10 Bilateral shoulder low row with red band 2x10 Wall slide with lift 2x10

## 2015-01-26 DIAGNOSIS — F322 Major depressive disorder, single episode, severe without psychotic features: Secondary | ICD-10-CM | POA: Diagnosis not present

## 2015-02-07 DIAGNOSIS — F410 Panic disorder [episodic paroxysmal anxiety] without agoraphobia: Secondary | ICD-10-CM | POA: Diagnosis not present

## 2015-02-07 DIAGNOSIS — F900 Attention-deficit hyperactivity disorder, predominantly inattentive type: Secondary | ICD-10-CM | POA: Diagnosis not present

## 2015-02-07 DIAGNOSIS — F332 Major depressive disorder, recurrent severe without psychotic features: Secondary | ICD-10-CM | POA: Diagnosis not present

## 2015-02-09 DIAGNOSIS — F322 Major depressive disorder, single episode, severe without psychotic features: Secondary | ICD-10-CM | POA: Diagnosis not present

## 2015-02-23 DIAGNOSIS — F322 Major depressive disorder, single episode, severe without psychotic features: Secondary | ICD-10-CM | POA: Diagnosis not present

## 2015-03-08 DIAGNOSIS — F322 Major depressive disorder, single episode, severe without psychotic features: Secondary | ICD-10-CM | POA: Diagnosis not present

## 2015-03-23 DIAGNOSIS — F322 Major depressive disorder, single episode, severe without psychotic features: Secondary | ICD-10-CM | POA: Diagnosis not present

## 2015-04-06 DIAGNOSIS — F322 Major depressive disorder, single episode, severe without psychotic features: Secondary | ICD-10-CM | POA: Diagnosis not present

## 2015-04-11 DIAGNOSIS — F322 Major depressive disorder, single episode, severe without psychotic features: Secondary | ICD-10-CM | POA: Diagnosis not present

## 2015-04-27 DIAGNOSIS — F322 Major depressive disorder, single episode, severe without psychotic features: Secondary | ICD-10-CM | POA: Diagnosis not present

## 2015-05-04 DIAGNOSIS — F900 Attention-deficit hyperactivity disorder, predominantly inattentive type: Secondary | ICD-10-CM | POA: Diagnosis not present

## 2015-05-04 DIAGNOSIS — F410 Panic disorder [episodic paroxysmal anxiety] without agoraphobia: Secondary | ICD-10-CM | POA: Diagnosis not present

## 2015-05-04 DIAGNOSIS — F332 Major depressive disorder, recurrent severe without psychotic features: Secondary | ICD-10-CM | POA: Diagnosis not present

## 2015-05-11 DIAGNOSIS — F322 Major depressive disorder, single episode, severe without psychotic features: Secondary | ICD-10-CM | POA: Diagnosis not present

## 2015-05-25 DIAGNOSIS — F332 Major depressive disorder, recurrent severe without psychotic features: Secondary | ICD-10-CM | POA: Diagnosis not present

## 2015-05-31 DIAGNOSIS — F322 Major depressive disorder, single episode, severe without psychotic features: Secondary | ICD-10-CM | POA: Diagnosis not present

## 2015-06-07 DIAGNOSIS — F322 Major depressive disorder, single episode, severe without psychotic features: Secondary | ICD-10-CM | POA: Diagnosis not present

## 2015-06-08 DIAGNOSIS — F4320 Adjustment disorder, unspecified: Secondary | ICD-10-CM | POA: Diagnosis not present

## 2015-07-06 DIAGNOSIS — F322 Major depressive disorder, single episode, severe without psychotic features: Secondary | ICD-10-CM | POA: Diagnosis not present

## 2015-07-20 DIAGNOSIS — F322 Major depressive disorder, single episode, severe without psychotic features: Secondary | ICD-10-CM | POA: Diagnosis not present

## 2015-07-26 DIAGNOSIS — F332 Major depressive disorder, recurrent severe without psychotic features: Secondary | ICD-10-CM | POA: Diagnosis not present

## 2015-07-26 DIAGNOSIS — F410 Panic disorder [episodic paroxysmal anxiety] without agoraphobia: Secondary | ICD-10-CM | POA: Diagnosis not present

## 2015-07-26 DIAGNOSIS — F900 Attention-deficit hyperactivity disorder, predominantly inattentive type: Secondary | ICD-10-CM | POA: Diagnosis not present

## 2015-07-28 ENCOUNTER — Other Ambulatory Visit
Admission: RE | Admit: 2015-07-28 | Discharge: 2015-07-28 | Disposition: A | Discharge status: Home / Self Care | Payer: 59 | Source: Ambulatory Visit | Attending: Psychiatry | Admitting: Psychiatry

## 2015-07-28 DIAGNOSIS — F332 Major depressive disorder, recurrent severe without psychotic features: Secondary | ICD-10-CM | POA: Insufficient documentation

## 2015-07-28 DIAGNOSIS — Z79899 Other long term (current) drug therapy: Secondary | ICD-10-CM | POA: Diagnosis not present

## 2015-07-28 LAB — HEMOGLOBIN A1C: Hgb A1c MFr Bld: 5.6 % (ref 4.0–6.0)

## 2015-07-28 LAB — LIPID PANEL
CHOLESTEROL: 171 mg/dL (ref 0–200)
HDL: 55 mg/dL (ref >40)
LDL Cholesterol: 82 mg/dL (ref 0–99)
TRIGLYCERIDES: 170 mg/dL — AB (ref <150)
Total CHOL/HDL Ratio: 3.1 RATIO
VLDL: 34 mg/dL (ref 0–40)

## 2015-07-29 LAB — PROLACTIN: PROLACTIN: 24.5 ng/mL — AB (ref 4.8–23.3)

## 2015-08-03 DIAGNOSIS — F4320 Adjustment disorder, unspecified: Secondary | ICD-10-CM | POA: Diagnosis not present

## 2015-08-03 DIAGNOSIS — F322 Major depressive disorder, single episode, severe without psychotic features: Secondary | ICD-10-CM | POA: Diagnosis not present

## 2015-08-24 DIAGNOSIS — F322 Major depressive disorder, single episode, severe without psychotic features: Secondary | ICD-10-CM | POA: Diagnosis not present

## 2015-08-24 DIAGNOSIS — Z6825 Body mass index (BMI) 25.0-25.9, adult: Secondary | ICD-10-CM | POA: Diagnosis not present

## 2015-08-24 DIAGNOSIS — Z1231 Encounter for screening mammogram for malignant neoplasm of breast: Secondary | ICD-10-CM | POA: Diagnosis not present

## 2015-08-24 DIAGNOSIS — Z01419 Encounter for gynecological examination (general) (routine) without abnormal findings: Secondary | ICD-10-CM | POA: Diagnosis not present

## 2015-09-07 DIAGNOSIS — M7611 Psoas tendinitis, right hip: Secondary | ICD-10-CM | POA: Diagnosis not present

## 2015-09-07 DIAGNOSIS — M25551 Pain in right hip: Secondary | ICD-10-CM | POA: Diagnosis not present

## 2015-09-21 DIAGNOSIS — F322 Major depressive disorder, single episode, severe without psychotic features: Secondary | ICD-10-CM | POA: Diagnosis not present

## 2015-10-03 ENCOUNTER — Ambulatory Visit: Payer: 59 | Attending: Orthopedic Surgery | Admitting: Physical Therapy

## 2015-10-03 DIAGNOSIS — M25551 Pain in right hip: Secondary | ICD-10-CM | POA: Diagnosis not present

## 2015-10-04 NOTE — Therapy (Signed)
[facility] [address], [place], [address] Phone: [phone]   Fax:  [phone]  Physical Therapy Evaluation  Patient Details  Name: Carolyn [name] MRN: [id number] Date of Birth: 1975/07/15 No Data Recorded  Encounter Date: 10/03/2015      PT End of Session - 10/04/15 1015    Visit Number 1   Number of Visits 4   Date for PT Re-Evaluation 11/15/15   PT Start Time 1708   PT Stop Time 1745   PT Time Calculation (min) 37 min   Activity Tolerance Patient tolerated treatment well   Behavior During Therapy [facility] for tasks assessed/performed      Past Medical History:  Diagnosis Date  . Anemia    a. low-normal H/H  . Anxiety   . Arthritis    osteopenia  . Benign essential tremor    a. bilateral upper and lower extremities; b. followed by [facility]; c. felt to be 2/2 antidepressants   . Carpal tunnel syndrome   . Deafness in left ear   . Depression   . Palpitations     Past Surgical History:  Procedure Laterality Date  . axillary lymph node removal Right 1982   enlarged lymph node  . IUD REMOVAL N/A 04/23/2014   Procedure: INTRAUTERINE DEVICE (IUD) REMOVAL;  Surgeon: Harold Hedge, MD;  Location: [facility];  Service: Gynecology;  Laterality: N/A;  . LAPAROSCOPIC TUBAL LIGATION Bilateral 04/23/2014   Procedure: LAPAROSCOPIC TUBAL LIGATION with Filshie Clips;  Surgeon: Harold Hedge, MD;  Location: [facility];  Service: Gynecology;  Laterality: Bilateral;    There were no vitals filed for this visit.       Subjective Assessment - 10/04/15 1020    Subjective Patient reports in the past year she began having clicking in her R hip while going up stairs. She eventually began to develop pain and saw orthopedist who prescribed Lodine. Since that time the clicking has reduced and she reports no additional pain. In fact she has more issues with LUE which was successfully treated with PT last fall. Reports no history of R  hip pain previously.    Limitations Walking   Diagnostic tests X-ray - indicative of degenerative disease of the hip joint.    Patient Stated Goals Would like to go up stairs without pain    Currently in Pain? No/denies      Squat assessment- roughly WNL no pain reported   Gait - no concerning abnormalities noted, mild Trendelenburg noted bilaterally   Hip ER WNL bilaterally, on RLE pain noted with end range IR in anterior hip.   SLR - negative  Ely's negative bilaterally   Lumbar mobilizations - no pain and roughly appropriate mobility noted.   Mild pain with L hip flexion in R hip area, no other pain on LE MMT and 5/5 for knee flexor/extensor and hip flexors.   Denies pain, numbness, or tingling below the hip, pain was only in anterior thigh. Stair ascent- mild weight shifting off RLE, no pain reported.   Glute max testing - 4+/5 bilaterally with no pain, no pain with prone hip extension ROM.  Attempted belt traction/mobilizations into IR -- mild reduction in symptoms with IR after completion.   Long axis distraction x 1 minute for 2 sets (no change in symptoms with IR)  Half kneeling hip flexor stretch x 8 for 2 sets with 10" holds -- reported resolution of pain symptoms with hip IR on RLE.  PT Education - 10/04/15 1018    Education provided Yes   Education Details Perform half kneeling stretch, call back in 2 weeks to report    Person(s) Educated Patient   Methods Explanation;Demonstration;Handout   Comprehension Verbalized understanding;Returned demonstration             PT Long Term Goals - 10/04/15 1015      PT LONG TERM GOAL #1   Title Patient will follow up with PT in 2 weeks to report progression of symptoms.    Time 6   Period Weeks   Status New     PT LONG TERM GOAL #2   Title Patient will report no pain during any activity during the next 2 weeks to demonstrate improved tolerance for ADLs.    Baseline  Pain with stair ascent prior to use of medication provided by referring MD.    Time 6   Period Weeks   Status New               Plan - 10/04/15 1017    Clinical Impression Statement Patient demonstrates decreased end range R hip IR with pain on exam, relieved with half kneeling hip flexor stretch on re-test. No additional symptoms or deficits identified in this evaluation that need skilled therapy. Patient complained more of L shoulder pain, on screening appeared to have RTC or biceps inflammation though strength in tact. Recommended to follow previous HEP from therapy and call therapist if any additional symptoms in 2 weeks.    Rehab Potential Excellent   Clinical Impairments Affecting Rehab Potential Resolution of symptoms to date.    PT Frequency 1x / week   PT Duration 4 weeks   PT Treatment/Interventions ADLs/Self Care Home Management;Balance training;Therapeutic exercise;Therapeutic activities;Manual techniques   PT Next Visit Plan Re-assess hip IR and stairs if needed.    PT Home Exercise Plan Half kneeling hip flexor stretch    Consulted and Agree with Plan of Care Patient      Patient will benefit from skilled therapeutic intervention in order to improve the following deficits and impairments:  Abnormal gait, Decreased strength  Visit Diagnosis: Pain in right hip - Plan: PT plan of care cert/re-cert     Problem List Patient Active Problem List   Diagnosis Date Noted  . Shortness of breath 08/03/2014  . Leg edema 08/03/2014  . Palpitations   . Depression   . Deafness in left ear   . Anemia 07/29/2014  . Anxiety 04/08/2013  . Benign essential tremor 04/08/2013  . Carpal tunnel syndrome 04/08/2013  . Clinical depression 04/08/2013   Kerin RansomPatrick A McNamara, PT, DPT    10/04/2015, 10:29 AM  [place] [facility] [address] [place], [place], [id number] Phone: [phone]   Fax:  [phone]  Name: Carolyn [name] MRN: [id number] Date of Birth: 03/11/1975

## 2015-10-05 ENCOUNTER — Encounter: Payer: 59 | Admitting: Physical Therapy

## 2015-10-10 ENCOUNTER — Encounter: Payer: 59 | Admitting: Physical Therapy

## 2015-10-19 DIAGNOSIS — F332 Major depressive disorder, recurrent severe without psychotic features: Secondary | ICD-10-CM | POA: Diagnosis not present

## 2015-10-19 DIAGNOSIS — F322 Major depressive disorder, single episode, severe without psychotic features: Secondary | ICD-10-CM | POA: Diagnosis not present

## 2015-10-20 ENCOUNTER — Encounter: Payer: 59 | Admitting: Physical Therapy

## 2015-10-20 DIAGNOSIS — F900 Attention-deficit hyperactivity disorder, predominantly inattentive type: Secondary | ICD-10-CM | POA: Diagnosis not present

## 2015-10-20 DIAGNOSIS — F410 Panic disorder [episodic paroxysmal anxiety] without agoraphobia: Secondary | ICD-10-CM | POA: Diagnosis not present

## 2015-10-20 DIAGNOSIS — F332 Major depressive disorder, recurrent severe without psychotic features: Secondary | ICD-10-CM | POA: Diagnosis not present

## 2015-10-24 ENCOUNTER — Encounter: Payer: 59 | Admitting: Physical Therapy

## 2015-11-16 DIAGNOSIS — F322 Major depressive disorder, single episode, severe without psychotic features: Secondary | ICD-10-CM | POA: Diagnosis not present

## 2015-11-22 DIAGNOSIS — E538 Deficiency of other specified B group vitamins: Secondary | ICD-10-CM | POA: Diagnosis not present

## 2015-11-22 DIAGNOSIS — Z79899 Other long term (current) drug therapy: Secondary | ICD-10-CM | POA: Diagnosis not present

## 2015-11-22 DIAGNOSIS — R200 Anesthesia of skin: Secondary | ICD-10-CM | POA: Diagnosis not present

## 2015-11-22 DIAGNOSIS — G250 Essential tremor: Secondary | ICD-10-CM | POA: Diagnosis not present

## 2015-11-22 DIAGNOSIS — E559 Vitamin D deficiency, unspecified: Secondary | ICD-10-CM | POA: Diagnosis not present

## 2015-11-29 ENCOUNTER — Encounter: Payer: Self-pay | Admitting: Family Medicine

## 2015-11-29 ENCOUNTER — Ambulatory Visit (INDEPENDENT_AMBULATORY_CARE_PROVIDER_SITE_OTHER): Payer: 59 | Admitting: Family Medicine

## 2015-11-29 VITALS — BP 111/61 | HR 81 | Temp 98.7°F | Resp 16 | Ht 69.0 in | Wt 186.0 lb

## 2015-11-29 DIAGNOSIS — E559 Vitamin D deficiency, unspecified: Secondary | ICD-10-CM

## 2015-11-29 DIAGNOSIS — R200 Anesthesia of skin: Secondary | ICD-10-CM | POA: Insufficient documentation

## 2015-11-29 DIAGNOSIS — Z0000 Encounter for general adult medical examination without abnormal findings: Secondary | ICD-10-CM | POA: Diagnosis not present

## 2015-11-29 DIAGNOSIS — F3342 Major depressive disorder, recurrent, in full remission: Secondary | ICD-10-CM | POA: Diagnosis not present

## 2015-11-29 DIAGNOSIS — R7309 Other abnormal glucose: Secondary | ICD-10-CM | POA: Insufficient documentation

## 2015-11-29 DIAGNOSIS — G250 Essential tremor: Secondary | ICD-10-CM

## 2015-11-29 DIAGNOSIS — F419 Anxiety disorder, unspecified: Secondary | ICD-10-CM

## 2015-11-29 DIAGNOSIS — M47816 Spondylosis without myelopathy or radiculopathy, lumbar region: Secondary | ICD-10-CM | POA: Insufficient documentation

## 2015-11-29 DIAGNOSIS — M8580 Other specified disorders of bone density and structure, unspecified site: Secondary | ICD-10-CM | POA: Diagnosis not present

## 2015-11-29 MED ORDER — VITAMIN D3 125 MCG (5000 UT) PO CAPS: 5000.0000 [IU] | ORAL_CAPSULE | Freq: Every day | ORAL | 0 refills | Status: DC

## 2015-11-29 NOTE — Assessment & Plan Note (Signed)
Stable, on DEXA 2016. No fractures H/o Vit D insufficiency, s/p treatment - Repeat course Vit D3 5k daily for 8 weeks then start 2k daily, + MVI with calcium

## 2015-11-29 NOTE — Assessment & Plan Note (Signed)
Stable, chronic problem, thought due to multiple factors including prior anti-depressants, also may be genetic component - Followed by Neurology Dr Sherryll BurgerShah, continue primidone 100 BID

## 2015-11-29 NOTE — Patient Instructions (Signed)
Thank you for coming in to clinic today.  1. I am not concerned at this time with your mildly elevated prolactin level, this is most likely from the abilify as a side effect. It is not high enough to cause any significant concerns. I do not think you need any head imaging at this time, if the level increased too much you may experience galactorrhea or nipple discharge, but again this is unlikely. Re-check in 1 year. 2. TSH mildly low, possible sub clinical hyperthyroidism, but we would need additional lab work, again do not think this needs to be re-checked sooner than 1 year. 3. Cholesterol looks improved, still mild elevated TG 4. Last A1c is 5.6, this is borderline of Pre-Diabetes (5.7 to 6.4), try your best to lower carb diet and limit sweets, and if you are able to start some mild regular exercise this will certainly help control this. You are at higher risk for increased blood sugar due to abilify  If you are concerned with still having frequent thirst and urination in next few months, then return LAB ONLY visit for blood draw and we can re-check A1c.  The 5 Minute Rule of Exercise - Promise yourself to at least do 5 minutes of exercise (make sure you time it), and if at the end of 5 minutes (this is the hardest part of the work-out), if you still feel like you want stop (or not motivated to continue) then allow yourself to stop. Otherwise, more often than not you will feel encouraged that you can continue for a little while longer or even more!  Diet Recommendations for Preventing / Diabetes   Starchy (carb) foods include: Bread, rice, pasta, potatoes, corn, crackers, bagels, muffins, all baked goods.   Protein foods include: Meat, fish, poultry, eggs, dairy foods, and beans such as pinto and kidney beans (beans also provide carbohydrate).   1. Eat at least 3 meals and 1-2 snacks per day. Never go more than 4-5 hours while awake without eating.   2. Limit starchy foods to TWO per meal and  ONE per snack. ONE portion of a starchy  food is equal to the following:   - ONE slice of bread (or its equivalent, such as half of a hamburger bun).   - 1/2 cup of a "scoopable" starchy food such as potatoes or rice.   - 1 OUNCE (28 grams) of starchy snacks (crackers or pretzels, look on label).   - 15 grams of carbohydrate as shown on food label.   3. Both lunch and dinner should include a protein food, a carb food, and vegetables.   - Obtain twice as many veg's as protein or carbohydrate foods for both lunch and dinner.   - Try to keep frozen veg's on hand for a quick vegetable serving.     - Fresh or frozen veg's are best.   4. Breakfast should always include protein.     Please schedule a follow-up appointment with Dr. Althea CharonKaramalegos in 1 year for Annual Physical  If you have any other questions or concerns, please feel free to call the clinic or send a message through MyChart. You may also schedule an earlier appointment if necessary.  Carolyn PilarAlexander Lotoya Casella, DO [facility]

## 2015-11-29 NOTE — Assessment & Plan Note (Signed)
Last Vit D 27, s/p 50k vit d3 treatment - Start Vitamin D3 5,000iu daily for 8 weeks, then Vit D3 2,000 iu daily maintenance, re-check Vit D next visit - continue MVI with Calcium

## 2015-11-29 NOTE — Assessment & Plan Note (Signed)
Chronic problem, followed by Psychiatry, continued on Zoloft, Abilify, Clonazepam PRN

## 2015-11-29 NOTE — Assessment & Plan Note (Signed)
Stable, chronic problem, followed by psychiatry, on Zoloft, Trazodone, Abilify

## 2015-11-29 NOTE — Assessment & Plan Note (Addendum)
Followed by Neurology, prior nerve conduction study, currently no clear etiology.

## 2015-11-29 NOTE — Assessment & Plan Note (Signed)
Near pre-DM with last A1c 5.6, concern with poor diet and on anti-psychotic abilify inc risk metabolic syndrome  Plan: 1. Recommend re-check A1c q 3-6 months, patient wants to hold off on re-check A1c states has been stable for while now, wants to try improved lifestyle modifications with low carb diet, limiting sugary drinks 2. Follow-up progress if need return sooner

## 2015-11-29 NOTE — Progress Notes (Signed)
Subjective:    Patient ID: Carolyn [name], female    DOB: 07/21/1975, 40 y.o.   MRN: [id number]  Carolyn [name] is a 40 y.o. female presenting on 11/29/2015 for Annual Exam ([facility] last [facility])   HPI   Psychiatry - Caryn SectionAarti Kapur, MD Neurology - Cristopher PeruHemang Shah, MD [facility] (Dr Rosita KeaMenz)  Depression / Anxiety, Chronic - Chronic problem followed by Psych, per chart review has been on variety of trials of many medications for mood including Zoloft, Lexapro, Fluoxetine, Celexa, Duloxetine, Venlafaxine, lamictal, risperdal, lithium, latuda - Recent lab work-up per Psychiatry with lipids, A1c, and prolactin, patient is concerned with mild elevated prolactin to 24.5, no prior problems with prolactin, no abnormalities on prior imaging, denies any galactorrhea - Currently taking Zoloft 200mg  daily, Trazodone 150mg  nightly, Abilify 7.5mg  daily - also infrequent clonazepam 0.5mg  maybe once weekly  Essential Tremor, Chronic / Right Foot numbness - Chronic problem with tremors in bilateral hands, legs >5-6 years, thought to be initially associated with anti-depressant medications, see above list. Per Neuro there is prior aunt with Parkinsons, but patient does not admit to resting tremors, has had lab work and MRI in 2014, and variety of treatments for tremors. Other work-up with [facility], and has had difficulty with balance. - Currently taking Primidone 100 BID, also taking Alpha lipoic acid  Elevated A1c / DM Screening - Last A1c 5.6 (07/28/15), without prior known history of Pre-DM. No significant family history of Diabetes. - No regular exercise or diet, carb heavy diet, some sweet tea, and water. She is interested in starting to improve lifestyle to help prevent diabetes - Admits polydipsia and polyuria  Vitamin D Insufficiency - Prior history of deficiency, was treated in past with Vitamin D3 50k weekly, now low at 27.9 - Taking MVI  Health  Maintenance: - Last pap smear 07/2015 negative. Followed by GYN ([facility]), one abnormal years ago without complications - DEXA 2016, dx with osteopenia - Mammogram 2017, negative no problems previously. Family history with Grandmother (paternal) breast cancer at old age 31>65  Past Medical History:  Diagnosis Date  . Anemia    a. low-normal H/H  . Anxiety   . Arthritis    osteopenia  . Benign essential tremor    a. bilateral upper and lower extremities; [facility]. [facility]; c. felt to be 2/2 antidepressants   . Carpal tunnel syndrome   . Deafness in left ear   . Depression   . Palpitations    Social History   Social History  . Marital status: Married    Spouse name: N/A  . Number of children: N/A  . Years of education: N/A   Occupational History  . Not on file.   Social History Main Topics  . Smoking status: Never Smoker  . Smokeless tobacco: Never Used  . Alcohol use No  . Drug use: No  . Sexual activity: Yes    Birth control/ protection: Surgical   Other Topics Concern  . Not on file   Social History Narrative  . No narrative on file   Family History  Problem Relation Age of Onset  . Depression Mother     bipolar disorder  . Arthritis Mother     osteoprosis  . Heart disease Father   . Heart attack Father [id number]  . Heart disease Paternal Grandmother   . Cancer Paternal Grandmother     breast cancer   Current Outpatient  Prescriptions on File Prior to Visit  Medication Sig  . ALPHA LIPOIC ACID PO Take 600 mg by mouth daily.   . ARIPiprazole (ABILIFY) 5 MG tablet Take 7.5 mg by mouth daily.  . Multiple Vitamins-Minerals (MULTIVITAMIN PO) Take 1 tablet by mouth daily.  . primidone (MYSOLINE) 50 MG tablet Take 100 mg by mouth 2 (two) times daily.  . sertraline (ZOLOFT) 100 MG tablet Take 200 mg by mouth daily.   No current facility-administered medications on file prior to visit.     Review of Systems  Constitutional: Negative for activity  change, appetite change, chills, diaphoresis, fatigue and fever.  HENT: Positive for hearing loss (chronic left ear). Negative for congestion.   Eyes: Negative for visual disturbance.  Respiratory: Negative for cough, chest tightness, shortness of breath and wheezing.   Cardiovascular: Negative for chest pain, palpitations and leg swelling.  Gastrointestinal: Negative for abdominal pain, constipation, diarrhea, nausea and vomiting.  Endocrine: Positive for polydipsia and polyuria.  Genitourinary: Negative for dysuria, frequency and hematuria.  Musculoskeletal: Negative for arthralgias and neck pain.  Skin: Negative for rash.  Allergic/Immunologic: Negative for environmental allergies.  Neurological: Positive for numbness (R foot plantar). Negative for dizziness, weakness, light-headedness and headaches.  Hematological: Negative for adenopathy.  Psychiatric/Behavioral: Positive for dysphoric mood. Negative for behavioral problems and sleep disturbance. The patient is nervous/anxious.    Per HPI unless specifically indicated above     Objective:    BP 111/61   Pulse 81   Temp 98.7 F (37.1 C) (Oral)   Resp 16   Ht 5\' 9"  (1.753 m)   Wt 186 lb (84.4 kg)   LMP 11/07/2015   BMI 27.47 kg/m   Wt Readings from Last 3 Encounters:  11/29/15 186 lb (84.4 kg)  08/10/14 188 lb 9.6 oz (85.5 kg)  08/03/14 189 lb (85.7 kg)    Physical Exam  Constitutional: She is oriented to person, place, and time. She appears well-developed and well-nourished. No distress.  Well-appearing, comfortable, cooperative  HENT:  Head: Normocephalic and atraumatic.  Mouth/Throat: Oropharynx is clear and moist.  Eyes: Conjunctivae and EOM are normal. Pupils are equal, round, and reactive to light.  Neck: Normal range of motion. Neck supple. No thyromegaly present.  Cardiovascular: Normal rate, regular rhythm, normal heart sounds and intact distal pulses.   No murmur heard. Pulmonary/Chest: Effort normal and  breath sounds normal. No respiratory distress. She has no wheezes. She has no rales.  Abdominal: Soft. She exhibits no distension. There is no tenderness.  Musculoskeletal: Normal range of motion. She exhibits no edema or tenderness.  Lymphadenopathy:    She has no cervical adenopathy.  Neurological: She is alert and oriented to person, place, and time. Coordination normal.  Distal sensation to light touch upper extremities intact, mild reduced sensation to plantar R foot to light foot  Skin: Skin is warm and dry. No rash noted. She is not diaphoretic.  Psychiatric: She has a normal mood and affect. Her behavior is normal.  Nursing note and vitals reviewed.  I have personally reviewed the radiology report from [facility] (images not available) on 06/2012, also R hip x-ray 08/2015.  FDI 0296 - LUMBAR SPINE AP AND LAT- Jun 24 2012  TECHNIQUE: 2 views lumbar spine INDICATION: ECT. COMPARISON: None. FINDINGS: No acute fracture or subluxation. Slightly increased lordotic curvature  of the lumbar spine. Vertebral body height and alignment are otherwise  well-maintained. Mild lower lumbar facet arthropathy. No acute soft  tissue findings.  IMPRESSION: No significant disease. -------------- AP, pelvis and lateral x-rays of the right hip were ordered and personally  reviewed today. These show slight narrowing centrally on the right  compared to the left.No evidence of fracture or other acute process. X-ray Impression: Mild central hip osteoarthritis right hip    I have personally reviewed the following lab results from 07/2015.  Results for orders placed or performed during the hospital encounter of 07/28/15  Prolactin  Result Value Ref Range   Prolactin 24.5 (H) 4.8 - 23.3 ng/mL  Hemoglobin A1c  Result Value Ref Range   Hgb A1c MFr Bld 5.6 4.0 - 6.0 %  Lipid panel  Result Value Ref Range   Cholesterol 171 0 - 200 mg/dL   Triglycerides 161 (H) <150 mg/dL    HDL 55 >09 mg/dL   Total CHOL/HDL Ratio 3.1 RATIO   VLDL 34 0 - 40 mg/dL   LDL Cholesterol 82 0 - 99 mg/dL      Assessment & Plan:   Problem List Items Addressed This Visit    Vitamin D insufficiency    Last Vit D 27, s/p 50k vit d3 treatment - Start Vitamin D3 5,000iu daily for 8 weeks, then Vit D3 2,000 iu daily maintenance, re-check Vit D next visit - continue MVI with Calcium      Relevant Medications   Cholecalciferol (VITAMIN D3) 5000 units CAPS   Osteopenia    Stable, on DEXA 2016. No fractures H/o Vit D insufficiency, s/p treatment - Repeat course Vit D3 5k daily for 8 weeks then start 2k daily, + MVI with calcium      Numbness of right foot    Followed by Neurology, prior nerve conduction study, currently no clear etiology.      Elevated hemoglobin A1c    Near pre-DM with last A1c 5.6, concern with poor diet and on anti-psychotic abilify inc risk metabolic syndrome  Plan: 1. Recommend re-check A1c q 3-6 months, patient wants to hold off on re-check A1c states has been stable for while now, wants to try improved lifestyle modifications with low carb diet, limiting sugary drinks 2. Follow-up progress if need return sooner      Depression    Stable, chronic problem, followed by psychiatry, on Zoloft, Trazodone, Abilify      Relevant Medications   traZODone (DESYREL) 150 MG tablet   Benign essential tremor    Stable, chronic problem, thought due to multiple factors including prior anti-depressants, also may be genetic component - Followed by Neurology Dr Sherryll Burger, continue primidone 100 BID      Anxiety    Chronic problem, followed by Psychiatry, continued on Zoloft, Abilify, Clonazepam PRN      Relevant Medications   traZODone (DESYREL) 150 MG tablet    Other Visit Diagnoses    Annual physical exam    -  Primary      Meds ordered this encounter  Medications  . etodolac (LODINE) 400 MG tablet    Sig: Take by mouth.  . traZODone (DESYREL) 150 MG tablet     Refill:  3  . clonazePAM (KLONOPIN) 0.5 MG tablet    Sig: Take 0.5 mg by mouth 2 (two) times daily as needed for anxiety.  . Cholecalciferol (VITAMIN D3) 5000 units CAPS    Sig: Take 1 capsule (5,000 Units total) by mouth daily. For 8 weeks, then start Vitamin D3 2,000 units daily (OTC)    Dispense:  60 capsule    Refill:  0  Follow up plan: Return in about 1 year (around 11/28/2016) for Annual physical.  Saralyn PilarAlexander Miron Marxen, DO [facility] 11/29/2015, 10:04 PM

## 2015-12-21 DIAGNOSIS — F322 Major depressive disorder, single episode, severe without psychotic features: Secondary | ICD-10-CM | POA: Diagnosis not present

## 2016-01-20 DIAGNOSIS — F332 Major depressive disorder, recurrent severe without psychotic features: Secondary | ICD-10-CM | POA: Diagnosis not present

## 2016-01-20 DIAGNOSIS — F900 Attention-deficit hyperactivity disorder, predominantly inattentive type: Secondary | ICD-10-CM | POA: Diagnosis not present

## 2016-01-20 DIAGNOSIS — F410 Panic disorder [episodic paroxysmal anxiety] without agoraphobia: Secondary | ICD-10-CM | POA: Diagnosis not present

## 2016-02-06 ENCOUNTER — Encounter: Payer: Self-pay | Admitting: Family Medicine

## 2016-02-06 DIAGNOSIS — N926 Irregular menstruation, unspecified: Secondary | ICD-10-CM | POA: Diagnosis not present

## 2016-02-06 LAB — TSH: TSH: 0.49 (ref 0.40–4.50)

## 2016-02-06 LAB — FOLLICLE STIMULATING HORMONE: FSH: 44

## 2016-02-13 ENCOUNTER — Encounter: Payer: Self-pay | Admitting: Family Medicine

## 2016-02-13 DIAGNOSIS — N951 Menopausal and female climacteric states: Secondary | ICD-10-CM | POA: Insufficient documentation

## 2016-02-17 ENCOUNTER — Ambulatory Visit (INDEPENDENT_AMBULATORY_CARE_PROVIDER_SITE_OTHER): Payer: 59 | Admitting: Internal Medicine

## 2016-02-17 DIAGNOSIS — Z23 Encounter for immunization: Secondary | ICD-10-CM | POA: Diagnosis not present

## 2016-02-17 DIAGNOSIS — Z7189 Other specified counseling: Secondary | ICD-10-CM

## 2016-02-17 DIAGNOSIS — Z9189 Other specified personal risk factors, not elsewhere classified: Secondary | ICD-10-CM | POA: Diagnosis not present

## 2016-02-17 DIAGNOSIS — Z789 Other specified health status: Secondary | ICD-10-CM | POA: Diagnosis not present

## 2016-02-17 DIAGNOSIS — Z7184 Encounter for health counseling related to travel: Secondary | ICD-10-CM

## 2016-02-17 DIAGNOSIS — Z298 Encounter for other specified prophylactic measures: Secondary | ICD-10-CM

## 2016-02-17 MED ORDER — AZITHROMYCIN 500 MG PO TABS: 500.0000 mg | ORAL_TABLET | Freq: Every day | ORAL | 0 refills | Status: DC

## 2016-02-17 MED ORDER — ACETAZOLAMIDE 125 MG PO TABS: 125.0000 mg | ORAL_TABLET | Freq: Two times a day (BID) | ORAL | 0 refills | Status: DC

## 2016-02-17 MED ORDER — TYPHOID VACCINE PO CPDR: 1.0000 | DELAYED_RELEASE_CAPSULE | ORAL | 0 refills | Status: DC

## 2016-02-17 NOTE — Progress Notes (Signed)
Subjective:   Carolyn [name] is a 41 y.o. female who presents to the Infectious Disease clinic for travel consultation.going on a motorcycle trip with her significant other to [place] including [place] Planned departure date: 03/23/2016 Planned return date: 04/04/2016 Countries of travel: [place] Areas in country:rural Accommodations: hotel Purpose of travel: vacation Prior travel out of [place]: y     Objective:   Medications:reviewed    Assessment:   No contraindications to travel. none   Plan:  Vaccines for the trip = Gave hep A #1  Gave rx for oral typhoid  Traveler's diarrhea = Gave rx for azithromycin  Altitude sicknes = gave Acetazolamide to start on day 2 of the ride for 8 days at elevatiosn > 10,000

## 2016-02-21 DIAGNOSIS — F322 Major depressive disorder, single episode, severe without psychotic features: Secondary | ICD-10-CM | POA: Diagnosis not present

## 2016-04-13 DIAGNOSIS — F900 Attention-deficit hyperactivity disorder, predominantly inattentive type: Secondary | ICD-10-CM | POA: Diagnosis not present

## 2016-04-13 DIAGNOSIS — F332 Major depressive disorder, recurrent severe without psychotic features: Secondary | ICD-10-CM | POA: Diagnosis not present

## 2016-04-13 DIAGNOSIS — F410 Panic disorder [episodic paroxysmal anxiety] without agoraphobia: Secondary | ICD-10-CM | POA: Diagnosis not present

## 2016-05-15 DIAGNOSIS — F322 Major depressive disorder, single episode, severe without psychotic features: Secondary | ICD-10-CM | POA: Diagnosis not present

## 2016-06-19 DIAGNOSIS — N951 Menopausal and female climacteric states: Secondary | ICD-10-CM | POA: Diagnosis not present

## 2016-07-19 DIAGNOSIS — F332 Major depressive disorder, recurrent severe without psychotic features: Secondary | ICD-10-CM | POA: Diagnosis not present

## 2016-07-19 DIAGNOSIS — F410 Panic disorder [episodic paroxysmal anxiety] without agoraphobia: Secondary | ICD-10-CM | POA: Diagnosis not present

## 2016-07-19 DIAGNOSIS — F900 Attention-deficit hyperactivity disorder, predominantly inattentive type: Secondary | ICD-10-CM | POA: Diagnosis not present

## 2016-07-25 DIAGNOSIS — N951 Menopausal and female climacteric states: Secondary | ICD-10-CM | POA: Diagnosis not present

## 2016-09-11 DIAGNOSIS — Z1231 Encounter for screening mammogram for malignant neoplasm of breast: Secondary | ICD-10-CM | POA: Diagnosis not present

## 2016-09-11 DIAGNOSIS — Z6827 Body mass index (BMI) 27.0-27.9, adult: Secondary | ICD-10-CM | POA: Diagnosis not present

## 2016-09-11 DIAGNOSIS — Z01419 Encounter for gynecological examination (general) (routine) without abnormal findings: Secondary | ICD-10-CM | POA: Diagnosis not present

## 2016-09-27 ENCOUNTER — Other Ambulatory Visit (HOSPITAL_COMMUNITY): Payer: Self-pay | Admitting: Obstetrics and Gynecology

## 2016-09-27 ENCOUNTER — Other Ambulatory Visit: Payer: Self-pay | Admitting: Obstetrics and Gynecology

## 2016-09-27 ENCOUNTER — Ambulatory Visit (HOSPITAL_COMMUNITY): Payer: 59

## 2016-09-27 ENCOUNTER — Ambulatory Visit
Admission: RE | Admit: 2016-09-27 | Discharge: 2016-09-27 | Disposition: A | Discharge status: Home / Self Care | Payer: 59 | Source: Ambulatory Visit | Attending: Obstetrics and Gynecology | Admitting: Obstetrics and Gynecology

## 2016-09-27 DIAGNOSIS — M79604 Pain in right leg: Secondary | ICD-10-CM

## 2016-09-27 DIAGNOSIS — M62831 Muscle spasm of calf: Secondary | ICD-10-CM | POA: Diagnosis not present

## 2016-09-27 DIAGNOSIS — R52 Pain, unspecified: Secondary | ICD-10-CM

## 2016-09-27 DIAGNOSIS — M79662 Pain in left lower leg: Secondary | ICD-10-CM | POA: Diagnosis not present

## 2016-09-27 DIAGNOSIS — M79605 Pain in left leg: Secondary | ICD-10-CM

## 2016-09-27 DIAGNOSIS — M79661 Pain in right lower leg: Secondary | ICD-10-CM | POA: Diagnosis not present

## 2016-10-17 DIAGNOSIS — F332 Major depressive disorder, recurrent severe without psychotic features: Secondary | ICD-10-CM | POA: Diagnosis not present

## 2016-10-17 DIAGNOSIS — F410 Panic disorder [episodic paroxysmal anxiety] without agoraphobia: Secondary | ICD-10-CM | POA: Diagnosis not present

## 2016-10-17 DIAGNOSIS — F900 Attention-deficit hyperactivity disorder, predominantly inattentive type: Secondary | ICD-10-CM | POA: Diagnosis not present

## 2016-10-24 ENCOUNTER — Ambulatory Visit: Payer: PRIVATE HEALTH INSURANCE | Attending: Orthopedic Surgery

## 2016-10-24 DIAGNOSIS — M25611 Stiffness of right shoulder, not elsewhere classified: Secondary | ICD-10-CM | POA: Diagnosis not present

## 2016-10-24 DIAGNOSIS — M25511 Pain in right shoulder: Secondary | ICD-10-CM | POA: Insufficient documentation

## 2016-10-24 DIAGNOSIS — R293 Abnormal posture: Secondary | ICD-10-CM | POA: Insufficient documentation

## 2016-10-24 NOTE — Therapy (Signed)
[place] [facility] [address] [place], [place], [address] Phone: [phone]   Fax:  [phone]  Physical Therapy Evaluation  Patient Details  Name: Carolyn [name] MRN: [id number] Date of Birth: 12/23/1975 Referring Provider: Dr. Cassell SmilesJames Bowers  Encounter Date: 10/24/2016      PT End of Session - 10/24/16 1200    Visit Number 1   Number of Visits 8   Date for PT Re-Evaluation 12/19/16   PT Start Time 1000   PT Stop Time 1054   PT Time Calculation (min) 54 min   Activity Tolerance Patient tolerated treatment well;Patient limited by pain   Behavior During Therapy [facility] for tasks assessed/performed      Past Medical History:  Diagnosis Date  . Anemia    a. low-normal H/H  . Anxiety   . Arthritis    osteopenia  . Benign essential tremor    a. bilateral upper and lower extremities; [facility]; c. felt to be 2/2 antidepressants   . Carpal tunnel syndrome   . Deafness in left ear   . Depression   . Palpitations     Past Surgical History:  Procedure Laterality Date  . axillary lymph node removal Right 1982   enlarged lymph node  . IUD REMOVAL N/A 04/23/2014   Procedure: INTRAUTERINE DEVICE (IUD) REMOVAL;  Surgeon: Harold HedgeJames Tomblin, MD;  Location: [facility];  Service: Gynecology;  Laterality: N/A;  . LAPAROSCOPIC TUBAL LIGATION Bilateral 04/23/2014   Procedure: LAPAROSCOPIC TUBAL LIGATION with Filshie Clips;  Surgeon: Harold HedgeJames Tomblin, MD;  Location: [facility];  Service: Gynecology;  Laterality: Bilateral;    There were no vitals filed for this visit.      Subjective Assessment - 10/24/16 1010    Subjective Patient is a pleasant 41 year old female who presents to physical therapy for R shoulder adhesive capsulitis. She is a Engineer, civil (consulting)nurse.    Pertinent History Patient is a pleasant 41 year old female who presents with R shoulder adhesive capsulitis. R shoulder pain began 09/17/16. Gave the flu shot high on the arm and had inflammation  and pain. Happened before with flu shot about 10-12 years before. Restriction of 5lb, no push or pull until Dec 4th. 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    Limitations Lifting;Walking;Writing;House hold activities   How long can you sit comfortably? painful   Diagnostic tests imaging   Patient Stated Goals decrease pain, return to previous level of function   Currently in Pain? Yes   Pain Score 4    Pain Location Shoulder   Pain Orientation Right   Pain Descriptors / Indicators Aching;Sharp   Pain Type Acute pain   Pain Radiating Towards numbness and tingling into hand    Pain Onset More than a month ago   Pain Frequency Constant   Aggravating Factors  hard to cross body and to reach    Pain Relieving Factors rest, ice   Effect of Pain on Daily Activities limits       Taking mobic per oral      [facility] PT Assessment - 10/24/16 0001      Assessment   Medical Diagnosis R adhesive capsulitis   Referring Provider Dr. Cassell SmilesJames Bowers   Onset Date/Surgical Date 09/17/16   Hand Dominance Right   Next MD Visit 12/11/16   Prior Therapy yes     Precautions   Precautions Shoulder   Type of  Shoulder Precautions no lifting more than 5 pounds, no push pull or reach   Shoulder Interventions For comfort   Precaution Booklet Issued No     Restrictions   Weight Bearing Restrictions No     Balance Screen   Has the patient fallen in the past 6 months No   Has the patient had a decrease in activity level because of a fear of falling?  Yes   Is the patient reluctant to leave their home because of a fear of falling?  No     Home Tourist information centre manager residence   Living Arrangements Spouse/significant other   Available Help at Discharge Family   Type of Home House   Home Access Stairs to enter   Entrance Stairs-Number of Steps 2   Entrance Stairs-Rails None   Home Layout Two level     Prior Function   Level of  Independence Independent with basic ADLs  takes longer to get dressed   Vocation Full time employment   Leisure Pottery     Cognition   Overall Cognitive Status Within Functional Limits for tasks assessed     Sensation   Light Touch Appears Intact     Coordination   Gross Motor Movements are Fluid and Coordinated No     Posture/Postural Control   Posture/Postural Control Postural limitations   Postural Limitations Rounded Shoulders;Forward head      PAIN: Worst: 8/10 Best pain: 2-3/10 Current pain: 4/10  POSTURE: Seated resting R elbow on knee to guard against pain   PROM/AROM: SEATED AROM  Right Left  Shoulder Flexion 76 177  Shoulder Abduction 110 175  ER 72 64  IR To stomach  WFL   supine Right Left  Shoulder Flexion 132 WFL  Shoulder Abduction 122 WFL  ER 55 WFL  IR 51 WFL   Mobilizations: AP: hypomobile, nonpainful PA: hypomobile, uncomfortable Inferior: hypo, feels good Distraction: pain relieving  Tight R upper trap leading to decreased L cervical rotation and side bending.    STRENGTH:  Graded on a 0-5 scale Muscle Group Left Right  Shoulder flex 4+/5 2+5  Shoulder Abd 4+/5 2+/5  Shoulder Ext 4+/5 2+/5  Shoulder IR/ER 4+/5 2+/5  Elbow 4+5 3-/5   SENSATION: Normal   SPECIAL TESTS: + shrug sign  -Hornblower +Hawkin Kennedy  +Neer  + empty can  + distraction   FUNCTIONAL MOBILITY: Pain reaching across body, Pain reaching up to shelf No pain reaching behind body   GAIT: Decreased R arm swing  OUTCOME MEASURES: TEST Outcome Interpretation  QuickDash 54.5%   QuickDash Work Module 25%                      Treat: AAROM : flexion, abduction, ER Scapular retraction Cervical side bend Pendulum swing       Objective measurements completed on examination: See above findings.                    PT Short Term Goals - 10/24/16 1253      PT SHORT TERM GOAL #1   Title Patient will be independent in home  exercise program to improve strength/mobility for better functional independence with ADLs.   Baseline 10/17: hep given   Time 2   Period Weeks   Status New   Target Date 11/07/16     PT SHORT TERM GOAL #2   Title Patient will be able to perform household work/ chores without increase  in symptoms.   Baseline worst pain 8/10   Time 2   Period Weeks   Status New   Target Date 11/07/16           PT Long Term Goals - 10/24/16 1255      PT LONG TERM GOAL #1   Title Patient will report a worst pain of 1/10 on VAS in  R shoulder   to improve tolerance with ADLs and reduced symptoms with activities.    Baseline Worst 8/10    Time 8   Period Weeks   Status New   Target Date 12/19/16     PT LONG TERM GOAL #2   Title Patient will reduce modified Oswestry score to <20 as to demonstrate minimal disability with ADLs including improved sleeping tolerance, walking/sitting tolerance etc for better mobility with ADLs.    Baseline 10/17: 54.5%   Time 8   Period Weeks   Status New   Target Date 12/19/16     PT LONG TERM GOAL #3   Title Patient will reduce modified Oswestry Work Module score to <20 as to demonstrate minimal disability with ADLs including improved sleeping tolerance, walking/sitting tolerance etc for better mobility with ADLs.    Baseline 10/17: 25%   Time 8   Period Weeks   Status New   Target Date 12/19/16     PT LONG TERM GOAL #4   Title Patient will improve shoulder AROM to > 140 degrees of flexion, scaption, and abduction for improved ability to perform overhead activities.   Baseline AROM:  76 flexion, abduction 110,   Time 8   Period Weeks   Status New   Target Date 12/19/16                Plan - 10/24/16 1236    Clinical Impression Statement Patient is a pleasant 41 year old female who presents with adhesive capsulitis of R shoulder. Patient presents with near full ROM with supine PROM however is limited in AROM with and without gravity assist.  Flexion, abduction, and cross body adduction painful to patient. Patient hypomobile with mobilizations and pain was relieved with distraction. Patient educated on need to retain PROM ROM independently via AAROM with cane and demonstrated understanding of HEP.  Special tests, combined with ROM deficits and MMT indicate patient has potential inflammation of posterior capsule and muscular related pain of movement leading to decreased mobility of R shoulder. QuickDash=54.5% and QuickDash Work Module =25%.  Patient will benefit from skilled physical therapy to decrease R shoulder pain, improve shoulder mobility, and allow patient to return to previous level of function.    History and Personal Factors relevant to plan of care: This patient presents with 3, personal factors/ comorbidities , and 1-2,  body elements including body structures and functions, activity limitations and or participation restrictions. Patient's condition is evolving   Clinical Presentation Evolving   Clinical Presentation due to: progressive decreased ROM since 09/17/16   Clinical Decision Making Moderate   Rehab Potential Good   Clinical Impairments Affecting Rehab Potential history of adhesive capsulitis, poor posture, restrictions (+) good understanding of body mechanics, good compliance   PT Frequency 2x / week   PT Duration 8 weeks   PT Treatment/Interventions ADLs/Self Care Home Management;Electrical Stimulation;Cryotherapy;Ultrasound;Traction;Moist Heat;Iontophoresis 4mg /ml Dexamethasone;Functional mobility training;Therapeutic activities;Therapeutic exercise;Patient/family education;Neuromuscular re-education;Manual techniques;Scar mobilization;Passive range of motion;Visual/perceptual remediation/compensation;Taping;Splinting;Energy conservation;Dry needling   PT Next Visit Plan distraction, PROM, isometrics   PT Home Exercise Plan see sheet   Consulted and  Agree with Plan of Care Patient      Patient will benefit from  skilled therapeutic intervention in order to improve the following deficits and impairments:  Abnormal gait, Decreased activity tolerance, Decreased coordination, Decreased range of motion, Decreased strength, Hypomobility, Impaired flexibility, Impaired perceived functional ability, Impaired UE functional use, Postural dysfunction, Improper body mechanics, Pain  Visit Diagnosis: Acute pain of right shoulder  Stiffness of right shoulder, not elsewhere classified  Abnormal posture     Problem List Patient Active Problem List   Diagnosis Date Noted  . Perimenopausal 02/13/2016  . 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 Numbness of right foot 11/29/2015  . Elevated hemoglobin A1c 11/29/2015  . Osteopenia 11/29/2015  . Vitamin D insufficiency 11/29/2015  . Shortness of breath 08/03/2014  . Leg edema 08/03/2014  . Palpitations   . Depression   . Deafness in left ear   . Anemia 07/29/2014  . Anxiety 04/08/2013  . Benign essential tremor 04/08/2013  . Carpal tunnel syndrome 04/08/2013   Precious Bard, PT, DPT   Precious Bard 10/24/2016, 2:00 PM  [place] [facility] MAIN [facility] SERVICES [address] [place], [place], [address] Phone: [phone]   Fax:  [phone]  Name: Carolyn [name] MRN: [id number] Date of Birth: [birth year]

## 2016-10-31 ENCOUNTER — Ambulatory Visit: Payer: Worker's Compensation

## 2016-10-31 ENCOUNTER — Ambulatory Visit: Payer: PRIVATE HEALTH INSURANCE

## 2016-10-31 DIAGNOSIS — R293 Abnormal posture: Secondary | ICD-10-CM | POA: Diagnosis not present

## 2016-10-31 DIAGNOSIS — M25511 Pain in right shoulder: Secondary | ICD-10-CM | POA: Diagnosis not present

## 2016-10-31 DIAGNOSIS — F322 Major depressive disorder, single episode, severe without psychotic features: Secondary | ICD-10-CM | POA: Diagnosis not present

## 2016-10-31 DIAGNOSIS — M25611 Stiffness of right shoulder, not elsewhere classified: Secondary | ICD-10-CM

## 2016-10-31 NOTE — Therapy (Signed)
[place] [facility] [address] [place], [place], [address] Phone: [phone]   Fax:  [phone]  Physical Therapy Treatment  Patient Details  Name: Carolyn [name] MRN: [id number] Date of Birth: 03/18/1975 Referring Provider: Dr. Cassell SmilesJames Bowers  Encounter Date: 10/31/2016      PT End of Session - 10/31/16 0858    Visit Number 2   Number of Visits 8   Date for PT Re-Evaluation 12/19/16   PT Start Time 0803   PT Stop Time 0846   PT Time Calculation (min) 43 min   Activity Tolerance Patient tolerated treatment well;Patient limited by pain   Behavior During Therapy [facility] for tasks assessed/performed      Past Medical History:  Diagnosis Date  . Anemia    a. low-normal H/H  . Anxiety   . Arthritis    osteopenia  . Benign essential tremor    a. bilateral upper and lower extremities; [facility]. [facility]; c. felt to be 2/2 antidepressants   . Carpal tunnel syndrome   . Deafness in left ear   . Depression   . Palpitations     Past Surgical History:  Procedure Laterality Date  . axillary lymph node removal Right 1982   enlarged lymph node  . IUD REMOVAL N/A 04/23/2014   Procedure: INTRAUTERINE DEVICE (IUD) REMOVAL;  Surgeon: Harold HedgeJames Tomblin, MD;  Location: [facility];  Service: Gynecology;  Laterality: N/A;  . LAPAROSCOPIC TUBAL LIGATION Bilateral 04/23/2014   Procedure: LAPAROSCOPIC TUBAL LIGATION with Filshie Clips;  Surgeon: Harold HedgeJames Tomblin, MD;  Location: [facility];  Service: Gynecology;  Laterality: Bilateral;    There were no vitals filed for this visit.      Subjective Assessment - 10/31/16 0806    Subjective Patient reports improvement with R shoulder, still difficult to reach across body. Patient compliant with HEP    Pertinent History Patient is a pleasant 41 year old female who presents with R shoulder adhesive capsulitis. R shoulder pain began 09/17/16. Gave the flu shot high on the arm and had inflammation and pain. Happened  before with flu shot about 10-12 years before. Restriction of 5lb, no push or pull until Dec 4th. 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    Limitations Lifting;Walking;Writing;House hold activities   How long can you sit comfortably? painful   Diagnostic tests imaging   Patient Stated Goals decrease pain, return to previous level of function   Currently in Pain? Yes   Pain Score 3    Pain Location Shoulder   Pain Orientation Right   Pain Descriptors / Indicators Aching   Pain Type Acute pain   Pain Onset More than a month ago   Pain Frequency Constant         Treat: AAROM : R shoulder flexion, abduction, ER 10x20 second holds : cues for left leading R  Robber stretch on half foam roller 60 seconds  Half foam roller: abduction with RTB 15x   Half foam roller: ER with RTB 15x Scapular retraction 15x with tactile cueing between shoulder blades Towel: Cervical side bend R and L 60 seconds, extension 60 seconds  Manual:  Distraction: 6x15 second pulls  PROM R shoulder 6x20 second holds: flexion, abduction, ER, good ROM no pain except with cross body adduction.   isometrics supine R shoulder: flex, extend, abd, add, IR, ER bicep, tricep 8x10 second holds  Resisted cross body AD 10x with  distraction    Pt. response to medical necessity:  Patient will continue to benefit from skilled physical therapy to improve shoulder function and mobility to return to previous level of function.                        PT Education - 10/31/16 0858    Education provided Yes   Education Details correction of HEP exercises   Person(s) Educated Patient   Methods Explanation;Demonstration;Verbal cues   Comprehension Verbalized understanding;Returned demonstration          PT Short Term Goals - 10/24/16 1253      PT SHORT TERM GOAL #1   Title Patient will be independent in home exercise program to improve strength/mobility  for better functional independence with ADLs.   Baseline 10/17: hep given   Time 2   Period Weeks   Status New   Target Date 11/07/16     PT SHORT TERM GOAL #2   Title Patient will be able to perform household work/ chores without increase in symptoms.   Baseline worst pain 8/10   Time 2   Period Weeks   Status New   Target Date 11/07/16           PT Long Term Goals - 10/24/16 1255      PT LONG TERM GOAL #1   Title Patient will report a worst pain of 1/10 on VAS in  R shoulder   to improve tolerance with ADLs and reduced symptoms with activities.    Baseline Worst 8/10    Time 8   Period Weeks   Status New   Target Date 12/19/16     PT LONG TERM GOAL #2   Title Patient will reduce modified Oswestry score to <20 as to demonstrate minimal disability with ADLs including improved sleeping tolerance, walking/sitting tolerance etc for better mobility with ADLs.    Baseline 10/17: 54.5%   Time 8   Period Weeks   Status New   Target Date 12/19/16     PT LONG TERM GOAL #3   Title Patient will reduce modified Oswestry Work Module score to <20 as to demonstrate minimal disability with ADLs including improved sleeping tolerance, walking/sitting tolerance etc for better mobility with ADLs.    Baseline 10/17: 25%   Time 8   Period Weeks   Status New   Target Date 12/19/16     PT LONG TERM GOAL #4   Title Patient will improve shoulder AROM to > 140 degrees of flexion, scaption, and abduction for improved ability to perform overhead activities.   Baseline AROM:  76 flexion, abduction 110,   Time 8   Period Weeks   Status New   Target Date 12/19/16               Plan - 10/31/16 1610    Clinical Impression Statement Patient required correction of posture for improved ROM. Improved pain free ROM performed with exception of cross body adduction. Cross body adduction pain reduced with implementation of distraction.  Patient will continue to benefit from skilled physical  therapy to improve shoulder function and mobility to return to previous level of function.    Rehab Potential Good   Clinical Impairments Affecting Rehab Potential history of adhesive capsulitis, poor posture, restrictions (+) good understanding of body mechanics, good compliance   PT Frequency 2x / week   PT Duration 8 weeks   PT Treatment/Interventions ADLs/Self Care Home Management;Electrical Stimulation;Cryotherapy;Ultrasound;Traction;Moist Heat;Iontophoresis  4mg /ml Dexamethasone;Functional mobility training;Therapeutic activities;Therapeutic exercise;Patient/family education;Neuromuscular re-education;Manual techniques;Scar mobilization;Passive range of motion;Visual/perceptual remediation/compensation;Taping;Splinting;Energy conservation;Dry needling   PT Next Visit Plan distraction, PROM, isometrics   PT Home Exercise Plan see sheet   Consulted and Agree with Plan of Care Patient      Patient will benefit from skilled therapeutic intervention in order to improve the following deficits and impairments:  Abnormal gait, Decreased activity tolerance, Decreased coordination, Decreased range of motion, Decreased strength, Hypomobility, Impaired flexibility, Impaired perceived functional ability, Impaired UE functional use, Postural dysfunction, Improper body mechanics, Pain  Visit Diagnosis: Acute pain of right shoulder  Stiffness of right shoulder, not elsewhere classified  Abnormal posture     Problem List Patient Active Problem List   Diagnosis Date Noted  . Perimenopausal 02/13/2016  . 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 Numbness of right foot 11/29/2015  . Elevated hemoglobin A1c 11/29/2015  . Osteopenia 11/29/2015  . Vitamin D insufficiency 11/29/2015  . Shortness of breath 08/03/2014  . Leg edema 08/03/2014  . Palpitations   . Depression   . Deafness in left ear   . Anemia 07/29/2014  . Anxiety 04/08/2013  . Benign essential tremor 04/08/2013   . Carpal tunnel syndrome 04/08/2013   Precious Bard, PT, DPT   Precious Bard 10/31/2016, 9:06 AM  [place] [facility] MAIN [facility] SERVICES [address] [place], [place], [address] Phone: [phone]   Fax:  [phone]  Name: Carolyn [name] MRN: [id number] Date of Birth: 10/28/1975

## 2016-11-05 DIAGNOSIS — F410 Panic disorder [episodic paroxysmal anxiety] without agoraphobia: Secondary | ICD-10-CM | POA: Diagnosis not present

## 2016-11-05 DIAGNOSIS — F900 Attention-deficit hyperactivity disorder, predominantly inattentive type: Secondary | ICD-10-CM | POA: Diagnosis not present

## 2016-11-05 DIAGNOSIS — F332 Major depressive disorder, recurrent severe without psychotic features: Secondary | ICD-10-CM | POA: Diagnosis not present

## 2016-11-07 ENCOUNTER — Ambulatory Visit: Payer: PRIVATE HEALTH INSURANCE

## 2016-11-12 ENCOUNTER — Ambulatory Visit: Payer: PRIVATE HEALTH INSURANCE | Attending: Orthopedic Surgery

## 2016-11-12 DIAGNOSIS — M25611 Stiffness of right shoulder, not elsewhere classified: Secondary | ICD-10-CM | POA: Diagnosis not present

## 2016-11-12 DIAGNOSIS — M25511 Pain in right shoulder: Secondary | ICD-10-CM | POA: Diagnosis not present

## 2016-11-12 DIAGNOSIS — R293 Abnormal posture: Secondary | ICD-10-CM | POA: Insufficient documentation

## 2016-11-12 NOTE — Therapy (Signed)
[facility] MAIN [facility] SERVICES [address] [place], [place], [address] Phone: [phone]   Fax:  [phone]  Physical Therapy Treatment  Patient Details  Name: Carolyn [name] MRN: [id number] Date of Birth: 1975-04-07 Referring Provider: Dr. Cassell Smiles   Encounter Date: 11/12/2016  PT End of Session - 11/12/16 0839    Visit Number  3    Number of Visits  8    Date for PT Re-Evaluation  12/19/16    PT Start Time  0810    PT Stop Time  0845    PT Time Calculation (min)  35 min    Activity Tolerance  Patient tolerated treatment well;Patient limited by pain    Behavior During Therapy  [facility] for tasks assessed/performed       Past Medical History:  Diagnosis Date  . Anemia    a. low-normal H/H  . Anxiety   . Arthritis    osteopenia  . Benign essential tremor    a. bilateral upper and lower extremities; b. followed by [facility]; c. felt to be 2/2 antidepressants   . Carpal tunnel syndrome   . Deafness in left ear   . Depression   . Palpitations     Past Surgical History:  Procedure Laterality Date  . axillary lymph node removal Right 1982   enlarged lymph node    There were no vitals filed for this visit.  Subjective Assessment - 11/12/16 0813    Subjective  Patient's mother in law passed away. Had a hectic week after that unable to perform HEP.     Pertinent History  Patient is a pleasant 41 year old female who presents with R shoulder adhesive capsulitis. R shoulder pain began 09/17/16. Gave the flu shot high on the arm and had inflammation and pain. Happened before with flu shot about 10-12 years before. Restriction of 5lb, no push or pull until Dec 4th. 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     Limitations  Lifting;Walking;Writing;House hold activities    How long can you sit comfortably?  painful    Diagnostic tests  imaging    Patient Stated Goals  decrease pain, return to  previous level of function    Currently in Pain?  Yes    Pain Score  1     Pain Location  Shoulder    Pain Orientation  Right    Pain Descriptors / Indicators  Aching    Pain Type  Acute pain    Pain Onset  More than a month ago    Pain Frequency  Constant        Treat: Scapular retraction 15x with tactile cueing between shoulder blades Wall walks flexion 10x, abduction 10x  Swiss ball flexion and side stretch 10x each direction , 5 second holds    Manual:  Distraction: 10x15 second pulls  PROM R shoulder 5x20 second holds: flexion, abduction, ER, good ROM no pain except with cross body adduction.   isometrics supine R shoulder: flex, extend, abd, add, IR, ER bicep, tricep 10x5 second holds  Resisted cross body AD 10x with distraction    Pt. response to medical necessity:  Patient will continue to benefit from skilled physical therapy to improve shoulder function and mobility to return to previous level of function.                        PT  Education - 11/12/16 [phone]    Education provided  Yes    Education Details  muscle activation techniques     Person(s) Educated  Patient    Methods  Explanation;Demonstration    Comprehension  Verbalized understanding;Returned demonstration       PT Short Term Goals - 10/24/16 1253      PT SHORT TERM GOAL #1   Title  Patient will be independent in home exercise program to improve strength/mobility for better functional independence with ADLs.    Baseline  10/17: hep given    Time  2    Period  Weeks    Status  New    Target Date  11/07/16      PT SHORT TERM GOAL #2   Title  Patient will be able to perform household work/ chores without increase in symptoms.    Baseline  worst pain 8/10    Time  2    Period  Weeks    Status  New    Target Date  11/07/16        PT Long Term Goals - 10/24/16 1255      PT LONG TERM GOAL #1   Title  Patient will report a worst pain of 1/10 on VAS in  R shoulder   to  improve tolerance with ADLs and reduced symptoms with activities.     Baseline  Worst 8/10     Time  8    Period  Weeks    Status  New    Target Date  12/19/16      PT LONG TERM GOAL #2   Title  Patient will reduce modified Oswestry score to <20 as to demonstrate minimal disability with ADLs including improved sleeping tolerance, walking/sitting tolerance etc for better mobility with ADLs.     Baseline  10/17: 54.5%    Time  8    Period  Weeks    Status  New    Target Date  12/19/16      PT LONG TERM GOAL #3   Title  Patient will reduce modified Oswestry Work Module score to <20 as to demonstrate minimal disability with ADLs including improved sleeping tolerance, walking/sitting tolerance etc for better mobility with ADLs.     Baseline  10/17: 25%    Time  8    Period  Weeks    Status  New    Target Date  12/19/16      PT LONG TERM GOAL #4   Title  Patient will improve shoulder AROM to > 140 degrees of flexion, scaption, and abduction for improved ability to perform overhead activities.    Baseline  AROM:  76 flexion, abduction 110,    Time  8    Period  Weeks    Status  New    Target Date  12/19/16            Plan - 11/12/16 0840    Clinical Impression Statement  Patient has slight regression in symptoms due to recent stress from loss in the family. AAROM motions including wall walks and ball slides decreased pain and were performed to full ROM. Patient will continue to benefit from skilled physical therapy to improve shoulder function and mobility to return to previous level of function.    Rehab Potential  Good    Clinical Impairments Affecting Rehab Potential  history of adhesive capsulitis, poor posture, restrictions (+) good understanding of body mechanics, good compliance    PT Frequency  2x / week    PT Duration  8 weeks    PT Treatment/Interventions  ADLs/Self Care Home Management;Electrical Stimulation;Cryotherapy;Ultrasound;Traction;Moist Heat;Iontophoresis  4mg /ml Dexamethasone;Functional mobility training;Therapeutic activities;Therapeutic exercise;Patient/family education;Neuromuscular re-education;Manual techniques;Scar mobilization;Passive range of motion;Visual/perceptual remediation/compensation;Taping;Splinting;Energy conservation;Dry needling    PT Next Visit Plan  distraction, PROM, isometrics    PT Home Exercise Plan  see sheet    Consulted and Agree with Plan of Care  Patient       Patient will benefit from skilled therapeutic intervention in order to improve the following deficits and impairments:  Abnormal gait, Decreased activity tolerance, Decreased coordination, Decreased range of motion, Decreased strength, Hypomobility, Impaired flexibility, Impaired perceived functional ability, Impaired UE functional use, Postural dysfunction, Improper body mechanics, Pain  Visit Diagnosis: Acute pain of right shoulder  Stiffness of right shoulder, not elsewhere classified  Abnormal posture     Problem List Patient Active Problem List   Diagnosis Date Noted  . Perimenopausal 02/13/2016  . 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 Numbness of right foot 11/29/2015  . Elevated hemoglobin A1c 11/29/2015  . Osteopenia 11/29/2015  . Vitamin D insufficiency 11/29/2015  . Shortness of breath 08/03/2014  . Leg edema 08/03/2014  . Palpitations   . Depression   . Deafness in left ear   . Anemia 07/29/2014  . Anxiety 04/08/2013  . Benign essential tremor 04/08/2013  . Carpal tunnel syndrome 04/08/2013   Precious BardMarina Couper Juncaj, PT, DPT   Precious BardMarina Natausha Jungwirth 11/12/2016, 8:45 AM  [place] [facility] [address] [place], [place], [address] Phone: [phone]   Fax:  [phone]  Name: Carolyn [name] MRN: [id number] Date of Birth: 01/13/1975

## 2016-11-13 DIAGNOSIS — F322 Major depressive disorder, single episode, severe without psychotic features: Secondary | ICD-10-CM | POA: Diagnosis not present

## 2016-11-14 ENCOUNTER — Ambulatory Visit: Payer: PRIVATE HEALTH INSURANCE

## 2016-11-14 DIAGNOSIS — R293 Abnormal posture: Secondary | ICD-10-CM

## 2016-11-14 DIAGNOSIS — M25611 Stiffness of right shoulder, not elsewhere classified: Secondary | ICD-10-CM

## 2016-11-14 DIAGNOSIS — M25511 Pain in right shoulder: Secondary | ICD-10-CM | POA: Diagnosis not present

## 2016-11-14 NOTE — Therapy (Signed)
[place] [facility] [address] [place], [place], [address] Phone: [phone]   Fax:  [phone]  Physical Therapy Treatment  Patient Details  Name: Carolyn [name] L Matsuo MRN: [id number] Date of Birth: 02/19/1975 Referring Provider: Dr. Cassell SmilesJames Bowers   Encounter Date: 11/14/2016  PT End of Session - 11/14/16 0809    Visit Number  4    Number of Visits  8    Date for PT Re-Evaluation  12/19/16    PT Start Time  0803    PT Stop Time  0848    PT Time Calculation (min)  45 min    Activity Tolerance  Patient tolerated treatment well;Patient limited by pain    Behavior During Therapy  [facility] for tasks assessed/performed       Past Medical History:  Diagnosis Date  . Anemia    a. low-normal H/H  . Anxiety   . Arthritis    osteopenia  . Benign essential tremor    a. bilateral upper and lower extremities; [facility]. [facility]; c. felt to be 2/2 antidepressants   . Carpal tunnel syndrome   . Deafness in left ear   . Depression   . Palpitations     Past Surgical History:  Procedure Laterality Date  . axillary lymph node removal Right 1982   enlarged lymph node    There were no vitals filed for this visit.  Subjective Assessment - 11/14/16 0808    Subjective  Patient starts work back today for first time in a month. Decreased pain this morning.     Pertinent History  Patient is a pleasant 41 year old female who presents with R shoulder adhesive capsulitis. R shoulder pain began 09/17/16. Gave the flu shot high on the arm and had inflammation and pain. Happened before with flu shot about 10-12 years before. Restriction of 5lb, no push or pull until Dec 4th. 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     Limitations  Lifting;Walking;Writing;House hold activities    How long can you sit comfortably?  painful    Diagnostic tests  imaging    Patient Stated Goals  decrease pain, return to  previous level of function    Currently in Pain?  Yes    Pain Score  1     Pain Location  Shoulder    Pain Orientation  Right    Pain Descriptors / Indicators  Aching    Pain Type  Acute pain    Pain Onset  More than a month ago    Pain Frequency  Constant       AAROM : R shoulder flexion, abduction, ER 8x20 second holds : cues for left leading R  Standing posture against wall 8x 5 second holds Robber stretch on half foam roller 60 seconds  Half foam roller: abduction with OTB 15x   Half foam roller: ER with OTB 15x Scapular retraction 15x with tactile cueing between shoulder blades Wall walks flexion 10x, abduction 10x  Swiss ball flexion and side stretch 10x each direction , 5 second holds    Manual:  Distraction: 6x15 second pulls   isometrics supine R shoulder: flex, extend, abd, add, IR, ER bicep, tricep 8x10 second holds  Resisted cross body AD 10x with distraction    Pt. response to medical necessity:  Patient will continue to benefit from skilled physical therapy to improve shoulder function and mobility to return to  previous level of function.                        PT Education - 11/14/16 0809    Education provided  Yes    Education Details  pain relief for at the work place    Starwood HotelsPerson(s) Educated  Patient    Methods  Explanation;Demonstration;Verbal cues    Comprehension  Verbalized understanding;Returned demonstration       PT Short Term Goals - 10/24/16 1253      PT SHORT TERM GOAL #1   Title  Patient will be independent in home exercise program to improve strength/mobility for better functional independence with ADLs.    Baseline  10/17: hep given    Time  2    Period  Weeks    Status  New    Target Date  11/07/16      PT SHORT TERM GOAL #2   Title  Patient will be able to perform household work/ chores without increase in symptoms.    Baseline  worst pain 8/10    Time  2    Period  Weeks    Status  New    Target Date  11/07/16         PT Long Term Goals - 10/24/16 1255      PT LONG TERM GOAL #1   Title  Patient will report a worst pain of 1/10 on VAS in  R shoulder   to improve tolerance with ADLs and reduced symptoms with activities.     Baseline  Worst 8/10     Time  8    Period  Weeks    Status  New    Target Date  12/19/16      PT LONG TERM GOAL #2   Title  Patient will reduce modified Oswestry score to <20 as to demonstrate minimal disability with ADLs including improved sleeping tolerance, walking/sitting tolerance etc for better mobility with ADLs.     Baseline  10/17: 54.5%    Time  8    Period  Weeks    Status  New    Target Date  12/19/16      PT LONG TERM GOAL #3   Title  Patient will reduce modified Oswestry Work Module score to <20 as to demonstrate minimal disability with ADLs including improved sleeping tolerance, walking/sitting tolerance etc for better mobility with ADLs.     Baseline  10/17: 25%    Time  8    Period  Weeks    Status  New    Target Date  12/19/16      PT LONG TERM GOAL #4   Title  Patient will improve shoulder AROM to > 140 degrees of flexion, scaption, and abduction for improved ability to perform overhead activities.    Baseline  AROM:  76 flexion, abduction 110,    Time  8    Period  Weeks    Status  New    Target Date  12/19/16            Plan - 11/14/16 0846    Clinical Impression Statement  Patient returning to work for first time in a month today. Improved active and AAROM motion noted throughout session with decreased pain. Posture interventions implemented to educate patient on proper alignment for decreased pain and impingement at work and home.  Patient will continue to benefit from skilled physical therapy to improve shoulder function and mobility to  return to previous level of function.     Rehab Potential  Good    Clinical Impairments Affecting Rehab Potential  history of adhesive capsulitis, poor posture, restrictions (+) good understanding of  body mechanics, good compliance    PT Frequency  2x / week    PT Duration  8 weeks    PT Treatment/Interventions  ADLs/Self Care Home Management;Electrical Stimulation;Cryotherapy;Ultrasound;Traction;Moist Heat;Iontophoresis 4mg /ml Dexamethasone;Functional mobility training;Therapeutic activities;Therapeutic exercise;Patient/family education;Neuromuscular re-education;Manual techniques;Scar mobilization;Passive range of motion;Visual/perceptual remediation/compensation;Taping;Splinting;Energy conservation;Dry needling    PT Next Visit Plan  distraction, PROM, isometrics    PT Home Exercise Plan  see sheet    Consulted and Agree with Plan of Care  Patient       Patient will benefit from skilled therapeutic intervention in order to improve the following deficits and impairments:  Abnormal gait, Decreased activity tolerance, Decreased coordination, Decreased range of motion, Decreased strength, Hypomobility, Impaired flexibility, Impaired perceived functional ability, Impaired UE functional use, Postural dysfunction, Improper body mechanics, Pain  Visit Diagnosis: Acute pain of right shoulder  Stiffness of right shoulder, not elsewhere classified  Abnormal posture     Problem List Patient Active Problem List   Diagnosis Date Noted  . Perimenopausal 02/13/2016  . 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 Numbness of right foot 11/29/2015  . Elevated hemoglobin A1c 11/29/2015  . Osteopenia 11/29/2015  . Vitamin D insufficiency 11/29/2015  . Shortness of breath 08/03/2014  . Leg edema 08/03/2014  . Palpitations   . Depression   . Deafness in left ear   . Anemia 07/29/2014  . Anxiety 04/08/2013  . Benign essential tremor 04/08/2013  . Carpal tunnel syndrome 04/08/2013   Precious Bard, PT, DPT   Precious Bard 11/14/2016, 8:49 AM  [place] [facility] MAIN [facility] SERVICES [address] [place], [place], [address] Phone: [phone]    Fax:  [phone]  Name: KESSLER KOPINSKI MRN: [id number] Date of Birth: 02-24-1975

## 2016-11-19 ENCOUNTER — Ambulatory Visit: Payer: PRIVATE HEALTH INSURANCE

## 2016-11-19 DIAGNOSIS — M25611 Stiffness of right shoulder, not elsewhere classified: Secondary | ICD-10-CM | POA: Diagnosis not present

## 2016-11-19 DIAGNOSIS — M25511 Pain in right shoulder: Secondary | ICD-10-CM

## 2016-11-19 DIAGNOSIS — R293 Abnormal posture: Secondary | ICD-10-CM | POA: Diagnosis not present
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
## 2016-11-20 DIAGNOSIS — F900 Attention-deficit hyperactivity disorder, predominantly inattentive type: Secondary | ICD-10-CM | POA: Diagnosis not present

## 2016-11-20 DIAGNOSIS — F332 Major depressive disorder, recurrent severe without psychotic features: Secondary | ICD-10-CM | POA: Diagnosis not present

## 2016-11-20 DIAGNOSIS — F410 Panic disorder [episodic paroxysmal anxiety] without agoraphobia: Secondary | ICD-10-CM | POA: Diagnosis not present

## 2016-11-21 ENCOUNTER — Ambulatory Visit: Payer: PRIVATE HEALTH INSURANCE

## 2016-11-21 DIAGNOSIS — G250 Essential tremor: Secondary | ICD-10-CM | POA: Diagnosis not present

## 2016-11-21 DIAGNOSIS — Z79899 Other long term (current) drug therapy: Secondary | ICD-10-CM | POA: Diagnosis not present

## 2016-11-23 ENCOUNTER — Ambulatory Visit: Payer: PRIVATE HEALTH INSURANCE | Attending: Orthopedic Surgery

## 2016-11-23 DIAGNOSIS — R293 Abnormal posture: Secondary | ICD-10-CM

## 2016-11-23 DIAGNOSIS — M25611 Stiffness of right shoulder, not elsewhere classified: Secondary | ICD-10-CM

## 2016-11-23 DIAGNOSIS — M25511 Pain in right shoulder: Secondary | ICD-10-CM

## 2016-11-23 NOTE — Therapy (Signed)
[address] [facility] MAIN [facility] [address] [place], [place], [address] Phone: [phone]   Fax:  [phone]  Physical Therapy Treatment  Patient Details  Name: Carolyn [name] Lasser MRN: [id number] Date of Birth: 07/22/1975 Referring Provider: Dr. Cassell SmilesJames Bowers   Encounter Date: 11/23/2016  PT End of Session - 11/23/16 0807    Visit Number  6    Number of Visits  8    Date for PT Re-Evaluation  12/19/16    PT Start Time  0801    PT Stop Time  0845    PT Time Calculation (min)  44 min    Activity Tolerance  Patient tolerated treatment well;Patient limited by pain    Behavior During Therapy  [facility] for tasks assessed/performed       Past Medical History:  Diagnosis Date  . Anemia    a. low-normal H/H  . Anxiety   . Arthritis    osteopenia  . Benign essential tremor    a. bilateral upper and lower extremities; [facility]; c. felt to be 2/2 antidepressants   . Carpal tunnel syndrome   . Deafness in left ear   . Depression   . Palpitations     Past Surgical History:  Procedure Laterality Date  . axillary lymph node removal Right 1982   enlarged lymph node  . IUD REMOVAL N/A 04/23/2014   Procedure: INTRAUTERINE DEVICE (IUD) REMOVAL;  Surgeon: Harold HedgeJames Tomblin, MD;  Location: [facility];  Service: Gynecology;  Laterality: N/A;  . LAPAROSCOPIC TUBAL LIGATION Bilateral 04/23/2014   Procedure: LAPAROSCOPIC TUBAL LIGATION with Filshie Clips;  Surgeon: Harold HedgeJames Tomblin, MD;  Location: [facility];  Service: Gynecology;  Laterality: Bilateral;    There were no vitals filed for this visit.  Subjective Assessment - 11/23/16 0806    Subjective  Patient notices that she is getting more movement without pain, still has some pain with cross body adduction. No pain just soreness today.     Pertinent History  Patient is a pleasant 41 year old female who presents with R shoulder adhesive capsulitis. R shoulder pain began 09/17/16. Gave the flu shot high on the arm  and had inflammation and pain. Happened before with flu shot about 10-12 years before. Restriction of 5lb, no push or pull until Dec 4th. 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     Limitations  Lifting;Walking;Writing;House hold activities    How long can you sit comfortably?  painful    Diagnostic tests  imaging    Patient Stated Goals  decrease pain, return to previous level of function    Currently in Pain?  No/denies     Heat applied in supine position  Cable row 2.5lb 2x10 Doorway stretch 3x 30 seconds  Standing posture against wall 8x 5 second holds Robber stretch 60 seconds in supine position  Scapular retraction 15x with tactile cueing between shoulder blades Wall walks flexion 10x, abduction 10x  Swiss ball flexion and side stretch 10x each direction , 5 second holds  Hands on knees hands on head 15x    Manual:  AP and inferior grade I-III mobilizations, good movement no pain Distraction: 6x15 second pulls   isometrics supine R shoulder: flex, extend, abd, add, IR, ER bicep, tricep 8x10 second holds  Resisted cross body AD 10x with distraction    Pt. response to medical necessity:  Patient will continue to benefit from skilled physical therapy to  improve shoulder function and mobility to return to previous level of function.                         PT Education - 11/23/16 0807    Education provided  Yes    Education Details   body mechanics for mobility    Person(s) Educated  Patient    Methods  Explanation;Demonstration;Verbal cues    Comprehension  Verbalized understanding;Returned demonstration       PT Short Term Goals - 10/24/16 1253      PT SHORT TERM GOAL #1   Title  Patient will be independent in home exercise program to improve strength/mobility for better functional independence with ADLs.    Baseline  10/17: hep given    Time  2    Period  Weeks    Status  New    Target Date   11/07/16      PT SHORT TERM GOAL #2   Title  Patient will be able to perform household work/ chores without increase in symptoms.    Baseline  worst pain 8/10    Time  2    Period  Weeks    Status  New    Target Date  11/07/16        PT Long Term Goals - 10/24/16 1255      PT LONG TERM GOAL #1   Title  Patient will report a worst pain of 1/10 on VAS in  R shoulder   to improve tolerance with ADLs and reduced symptoms with activities.     Baseline  Worst 8/10     Time  8    Period  Weeks    Status  New    Target Date  12/19/16      PT LONG TERM GOAL #2   Title  Patient will reduce modified Oswestry score to <20 as to demonstrate minimal disability with ADLs including improved sleeping tolerance, walking/sitting tolerance etc for better mobility with ADLs.     Baseline  10/17: 54.5%    Time  8    Period  Weeks    Status  New    Target Date  12/19/16      PT LONG TERM GOAL #3   Title  Patient will reduce modified Oswestry Work Module score to <20 as to demonstrate minimal disability with ADLs including improved sleeping tolerance, walking/sitting tolerance etc for better mobility with ADLs.     Baseline  10/17: 25%    Time  8    Period  Weeks    Status  New    Target Date  12/19/16      PT LONG TERM GOAL #4   Title  Patient will improve shoulder AROM to > 140 degrees of flexion, scaption, and abduction for improved ability to perform overhead activities.    Baseline  AROM:  76 flexion, abduction 110,    Time  8    Period  Weeks    Status  New    Target Date  12/19/16            Plan - 11/23/16 0825    Clinical Impression Statement  Patient has improved mobility with decreased pain upon movement. Cross body adduction continues to be limited motion at this time by discomfort, Posture needs continued cueing.  Patient will continue to benefit from skilled physical therapy to improve shoulder function and mobility to return to previous level of function.  Rehab  Potential  Good    Clinical Impairments Affecting Rehab Potential  history of adhesive capsulitis, poor posture, restrictions (+) good understanding of body mechanics, good compliance    PT Frequency  2x / week    PT Duration  8 weeks    PT Treatment/Interventions  ADLs/Self Care Home Management;Electrical Stimulation;Cryotherapy;Ultrasound;Traction;Moist Heat;Iontophoresis 4mg /ml Dexamethasone;Functional mobility training;Therapeutic activities;Therapeutic exercise;Patient/family education;Neuromuscular re-education;Manual techniques;Scar mobilization;Passive range of motion;Visual/perceptual remediation/compensation;Taping;Splinting;Energy conservation;Dry needling    PT Next Visit Plan  distraction, PROM, isometrics    PT Home Exercise Plan  see sheet    Consulted and Agree with Plan of Care  Patient       Patient will benefit from skilled therapeutic intervention in order to improve the following deficits and impairments:  Abnormal gait, Decreased activity tolerance, Decreased coordination, Decreased range of motion, Decreased strength, Hypomobility, Impaired flexibility, Impaired perceived functional ability, Impaired UE functional use, Postural dysfunction, Improper body mechanics, Pain  Visit Diagnosis: Acute pain of right shoulder  Stiffness of right shoulder, not elsewhere classified  Abnormal posture     Problem List Patient Active Problem List   Diagnosis Date Noted  . Perimenopausal 02/13/2016  . 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 Numbness of right foot 11/29/2015  . Elevated hemoglobin A1c 11/29/2015  . Osteopenia 11/29/2015  . Vitamin D insufficiency 11/29/2015  . Shortness of breath 08/03/2014  . Leg edema 08/03/2014  . Palpitations   . Depression   . Deafness in left ear   . Anemia 07/29/2014  . Anxiety 04/08/2013  . Benign essential tremor 04/08/2013  . Carpal tunnel syndrome 04/08/2013   Precious BardMarina Binta Statzer, PT, DPT   Precious BardMarina  Merlina Marchena 11/23/2016, 8:45 AM  [place] [facility] [address] [place], [place], [address] Phone: [phone]   Fax:  [phone]  Name: Carolyn [name] MRN: [id number] Date of Birth: 05/13/1975

## 2016-11-26 ENCOUNTER — Ambulatory Visit: Payer: PRIVATE HEALTH INSURANCE

## 2016-11-26 DIAGNOSIS — M25611 Stiffness of right shoulder, not elsewhere classified: Secondary | ICD-10-CM | POA: Diagnosis not present

## 2016-11-26 DIAGNOSIS — M25511 Pain in right shoulder: Secondary | ICD-10-CM

## 2016-11-26 DIAGNOSIS — R293 Abnormal posture: Secondary | ICD-10-CM

## 2016-11-26 NOTE — Therapy (Signed)
[place] [facility] [address] [place], [place], [address] Phone: [phone]   Fax:  [phone]  Physical Therapy Treatment  Patient Details  Name: Carolyn [name] MRN: [id number] Date of Birth: 11/14/1975 Referring Provider: Dr. Cassell SmilesJames Bowers   Encounter Date: 11/26/2016  PT End of Session - 11/26/16 0823    Visit Number  6    Number of Visits  8    Date for PT Re-Evaluation  12/19/16    PT Start Time  0805    PT Stop Time  0846    PT Time Calculation (min)  41 min    Activity Tolerance  Patient tolerated treatment well;Patient limited by pain    Behavior During Therapy  [facility] for tasks assessed/performed       Past Medical History:  Diagnosis Date  . Anemia    a. low-normal H/H  . Anxiety   . Arthritis    osteopenia  . Benign essential tremor    a. bilateral upper and lower extremities; [facility]. [facility]; c. felt to be 2/2 antidepressants   . Carpal tunnel syndrome   . Deafness in left ear   . Depression   . Palpitations     Past Surgical History:  Procedure Laterality Date  . axillary lymph node removal Right 1982   enlarged lymph node  . INTRAUTERINE DEVICE (IUD) REMOVAL N/A 04/23/2014   [facility]blin[facility] James[facility]  . LAPAROSCOPIC TUBAL LIGATION with Filshie Clips Bilateral 04/23/2014   [facility]formed [facility]blin, James[facility]    There were no vitals filed for this visit.  Subjective Assessment - 11/26/16 0810    Subjective  Patient reports no pain, she has been doing HEP, has difficulty with the doorway stretch.     Pertinent History  Patient is a pleasant 41 year old female who presents with R shoulder adhesive capsulitis. R shoulder pain began 09/17/16. Gave the flu shot high on the arm and had inflammation and pain. Happened before with flu shot about 10-12 years before. Restriction of 5lb, no push or pull until Dec 4th. 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     Limitations  Lifting;Walking;Writing;House hold activities    How long can you sit comfortably?  painful    Diagnostic tests  imaging    Patient Stated Goals  decrease pain, return to previous level of function    Currently in Pain?  No/denies       Heat applied in supine position   Cable row 2.5lb 2x10 Seated row OTB 2x 15 Doorway stretch 3x 30 seconds  Robber stretch 60 seconds in supine position  Scapular retraction 15x with tactile cueing between shoulder blades Wall walks flexion 10x, abduction 10x  Swiss ball flexion and side stretch 10x each direction , 5 second holds  Hands on knees hands on head 15x    Manual:  AP and inferior grade I-IV mobilizations, good movement no pain Distraction: 6x15 second pulls   isometrics supine R shoulder: flex, extend, abd, add, IR, ER bicep, tricep 8x10 second holds  Resisted cross body AD 10x with distraction    Pt. response to medical necessity:  Patient will continue to benefit from skilled physical therapy to improve shoulder function and mobility to return to previous level of function.  PT Education - 11/26/16 [phone]    Education provided  Yes    Education Details  doorway stretch compliance    Person(s) Educated  Patient    Methods  Explanation;Demonstration;Verbal cues    Comprehension  Verbalized understanding;Returned demonstration       PT Short Term Goals - 10/24/16 1253      PT SHORT TERM GOAL #1   Title  Patient will be independent in home exercise program to improve strength/mobility for better functional independence with ADLs.    Baseline  10/17: hep given    Time  2    Period  Weeks    Status  New    Target Date  11/07/16      PT SHORT TERM GOAL #2   Title  Patient will be able to perform household work/ chores without increase in symptoms.    Baseline  worst pain 8/10    Time  2    Period  Weeks    Status  New    Target Date  11/07/16         PT Long Term Goals - 10/24/16 1255      PT LONG TERM GOAL #1   Title  Patient will report a worst pain of 1/10 on VAS in  R shoulder   to improve tolerance with ADLs and reduced symptoms with activities.     Baseline  Worst 8/10     Time  8    Period  Weeks    Status  New    Target Date  12/19/16      PT LONG TERM GOAL #2   Title  Patient will reduce modified Oswestry score to <20 as to demonstrate minimal disability with ADLs including improved sleeping tolerance, walking/sitting tolerance etc for better mobility with ADLs.     Baseline  10/17: 54.5%    Time  8    Period  Weeks    Status  New    Target Date  12/19/16      PT LONG TERM GOAL #3   Title  Patient will reduce modified Oswestry Work Module score to <20 as to demonstrate minimal disability with ADLs including improved sleeping tolerance, walking/sitting tolerance etc for better mobility with ADLs.     Baseline  10/17: 25%    Time  8    Period  Weeks    Status  New    Target Date  12/19/16      PT LONG TERM GOAL #4   Title  Patient will improve shoulder AROM to > 140 degrees of flexion, scaption, and abduction for improved ability to perform overhead activities.    Baseline  AROM:  76 flexion, abduction 110,    Time  8    Period  Weeks    Status  New    Target Date  12/19/16            Plan - 11/26/16 [id number]    Clinical Impression Statement  Patient has increased mobility with minimal pain. Painful end mobilizations in supine at grade IV during AP.  Cable rows performed with minimal cueing for body mechanics,however had noted wrist compensation at end due to muscle fatigue of postural muscles.  Patient will continue to benefit from skilled physical therapy to improve shoulder function and mobility to return to previous level of function.    Rehab Potential  Good    Clinical Impairments Affecting Rehab Potential  history of adhesive capsulitis, poor posture, restrictions (+) good  understanding of body  mechanics, good compliance    PT Frequency  2x / week    PT Duration  8 weeks    PT Treatment/Interventions  ADLs/Self Care Home Management;Electrical Stimulation;Cryotherapy;Ultrasound;Traction;Moist Heat;Iontophoresis 4mg /ml Dexamethasone;Functional mobility training;Therapeutic activities;Therapeutic exercise;Patient/family education;Neuromuscular re-education;Manual techniques;Scar mobilization;Passive range of motion;Visual/perceptual remediation/compensation;Taping;Splinting;Energy conservation;Dry needling    PT Next Visit Plan  distraction, PROM, isometrics    PT Home Exercise Plan  see sheet    Consulted and Agree with Plan of Care  Patient       Patient will benefit from skilled therapeutic intervention in order to improve the following deficits and impairments:  Abnormal gait, Decreased activity tolerance, Decreased coordination, Decreased range of motion, Decreased strength, Hypomobility, Impaired flexibility, Impaired perceived functional ability, Impaired UE functional use, Postural dysfunction, Improper body mechanics, Pain  Visit Diagnosis: Acute pain of right shoulder  Stiffness of right shoulder, not elsewhere classified  Abnormal posture     Problem List Patient Active Problem List   Diagnosis Date Noted  . Perimenopausal 02/13/2016  . 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 Numbness of right foot 11/29/2015  . Elevated hemoglobin A1c 11/29/2015  . Osteopenia 11/29/2015  . Vitamin D insufficiency 11/29/2015  . Shortness of breath 08/03/2014  . Leg edema 08/03/2014  . Palpitations   . Depression   . Deafness in left ear   . Anemia 07/29/2014  . Anxiety 04/08/2013  . Benign essential tremor 04/08/2013  . Carpal tunnel syndrome 04/08/2013   Precious Bard, PT, DPT   Precious Bard 11/26/2016, 8:46 AM  [place] [facility] MAIN [facility] SERVICES [address] [place], [place], [address] Phone: [phone]    Fax:  [phone]  Name: Carolyn [name] MRN: [id number] Date of Birth: [birth year]

## 2016-11-28 ENCOUNTER — Ambulatory Visit: Payer: PRIVATE HEALTH INSURANCE

## 2016-11-28 DIAGNOSIS — R293 Abnormal posture: Secondary | ICD-10-CM | POA: Diagnosis not present

## 2016-11-28 DIAGNOSIS — M25511 Pain in right shoulder: Secondary | ICD-10-CM

## 2016-11-28 DIAGNOSIS — M25611 Stiffness of right shoulder, not elsewhere classified: Secondary | ICD-10-CM | POA: Diagnosis not present

## 2016-11-28 NOTE — Therapy (Signed)
[place] MAIN [facility] SERVICES [address] [place], [place], [address] Phone: [phone]   Fax:  [phone]  Physical Therapy Treatment  Patient Details  Name: Carolyn [name] MRN: [id number] Date of Birth: 07/23/1975 Referring Provider: Dr. Kurtis Bushman   Encounter Date: 11/28/2016  PT End of Session - 11/28/16 0808    Visit Number  8    Number of Visits  8    Date for PT Re-Evaluation  12/19/16    PT Start Time  0800    PT Stop Time  0845    PT Time Calculation (min)  45 min    Activity Tolerance  Patient tolerated treatment well;Patient limited by pain    Behavior During Therapy  [facility] for tasks assessed/performed       Past Medical History:  Diagnosis Date  . Anemia    a. low-normal H/H  . Anxiety   . Arthritis    osteopenia  . Benign essential tremor    a. bilateral upper and lower extremities; b. followed by [facility]; c. felt to be 2/2 antidepressants   . Carpal tunnel syndrome   . Deafness in left ear   . Depression   . Palpitations     Past Surgical History:  Procedure Laterality Date  . axillary lymph node removal Right 1982   enlarged lymph node  . IUD REMOVAL N/A 04/23/2014   Procedure: INTRAUTERINE DEVICE (IUD) REMOVAL;  Surgeon: Everlene Farrier, MD;  Location: [place] [facility];  Service: Gynecology;  Laterality: N/A;  . LAPAROSCOPIC TUBAL LIGATION Bilateral 04/23/2014   Procedure: LAPAROSCOPIC TUBAL LIGATION with Filshie Clips;  Surgeon: Everlene Farrier, MD;  Location: [place] [facility];  Service: Gynecology;  Laterality: Bilateral;    There were no vitals filed for this visit.  Subjective Assessment - 11/28/16 0804    Subjective  Patient still has a little difficulty lifting jacket overhead and reaching across body. Was having no pain until this morning, now has 2-3/10 pain this morning when moving arm, no pain when not moving.     Pertinent History  Patient is a pleasant 41 year old female who presents with R shoulder adhesive capsulitis. R  shoulder pain began 09/17/16. Gave the flu shot high on the arm and had inflammation and pain. Happened before with flu shot about 10-12 years before. Restriction of 5lb, no push or pull until Dec 4th. 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     Limitations  Lifting;Walking;Writing;House hold activities    How long can you sit comfortably?  painful    Diagnostic tests  imaging    Patient Stated Goals  decrease pain, return to previous level of function    Currently in Pain?  No/denies      QuickDash Work =0%  QuickDash=18%  AROM flexion: 178 abduction 180      Seated row OTB 2x 15 Doorway stretch 3x 30 seconds  Robber stretch 60 seconds in supine position  Scapular retraction 15x with tactile cueing between shoulder blades Swiss ball flexion and side stretch 10x each direction , 5 second holds  Hands on knees hands on head 15x    Manual:  AP and inferior grade I-IV mobilizations, good movement no pain Distraction: 6x15 second pulls   isometrics supine R shoulder: flex, extend, abd, add, IR, ER bicep, tricep 8x10 second holds  Resisted cross body AD 10x with distraction    Pt. response to medical necessity:  Patient will  continue to benefit from skilled physical therapy to improve shoulder function and mobility to return to previous level of function.                PT Education - 11/28/16 0808    Education provided  Yes    Education Details  POC, exercise compliance    Person(s) Educated  Patient    Methods  Explanation;Demonstration;Verbal cues    Comprehension  Verbalized understanding;Returned demonstration       PT Short Term Goals - 11/28/16 0810      PT SHORT TERM GOAL #1   Title  Patient will be independent in home exercise program to improve strength/mobility for better functional independence with ADLs.    Baseline  11/21: independent    Time  2    Period  Weeks    Status  Achieved      PT  SHORT TERM GOAL #2   Title  Patient will be able to perform household work/ chores without increase in symptoms.    Baseline  pain occasionally increases to 2/10    Time  2    Period  Weeks    Status  Partially Met        PT Long Term Goals - 11/28/16 8127      PT LONG TERM GOAL #1   Title  Patient will report a worst pain of 1/10 on VAS in  R shoulder   to improve tolerance with ADLs and reduced symptoms with activities.     Baseline  Worst pain: 2/10     Time  8    Period  Weeks    Status  Partially Met      PT LONG TERM GOAL #2   Title  Patient will reduce QuickDash score to <20 as to demonstrate minimal disability with ADLs including improved sleeping tolerance, walking/sitting tolerance etc for better mobility with ADLs.     Baseline  10/17: 54.5% 11/31: 18%    Time  8    Period  Weeks    Status  Achieved      PT LONG TERM GOAL #3   Title  Patient will reduce QuickDash work  score to <20 as to demonstrate minimal disability with ADLs including improved sleeping tolerance, walking/sitting tolerance etc for better mobility with ADLs.     Baseline  10/17: 25% 11/21: 0%    Time  8    Period  Weeks    Status  Achieved      PT LONG TERM GOAL #4   Title  Patient will improve shoulder AROM to > 140 degrees of flexion, scaption, and abduction for improved ability to perform overhead activities.    Baseline  AROM:  76 flexion, abduction 110,11/21; AROM flexion 178, abduction 189    Time  8    Period  Weeks    Status  Achieved            Plan - 11/28/16 0854    Clinical Impression Statement  Patient has full ROM and functional strength in RUE at this time. Slight limitation at end range cross body adduction. Patient has met/progressed towards all goals, improving QuickDash to 18% and QuickDash work to 0%. Patient is ready to progress to independent program at this time. I will be happy to see patient again in the future.     Rehab Potential  Good    Clinical Impairments  Affecting Rehab Potential  history of adhesive capsulitis, poor posture, restrictions (+) good  understanding of body mechanics, good compliance    PT Frequency  2x / week    PT Duration  8 weeks    PT Treatment/Interventions  ADLs/Self Care Home Management;Electrical Stimulation;Cryotherapy;Ultrasound;Traction;Moist Heat;Iontophoresis 2m/ml Dexamethasone;Functional mobility training;Therapeutic activities;Therapeutic exercise;Patient/family education;Neuromuscular re-education;Manual techniques;Scar mobilization;Passive range of motion;Visual/perceptual remediation/compensation;Taping;Splinting;Energy conservation;Dry needling    PT Next Visit Plan  distraction, PROM, isometrics    PT Home Exercise Plan  see sheet    Consulted and Agree with Plan of Care  Patient       Patient will benefit from skilled therapeutic intervention in order to improve the following deficits and impairments:  Abnormal gait, Decreased activity tolerance, Decreased coordination, Decreased range of motion, Decreased strength, Hypomobility, Impaired flexibility, Impaired perceived functional ability, Impaired UE functional use, Postural dysfunction, Improper body mechanics, Pain  Visit Diagnosis: Acute pain of right shoulder  Stiffness of right shoulder, not elsewhere classified  Abnormal posture     Problem List Patient Active Problem List   Diagnosis Date Noted  . Perimenopausal 02/13/2016  . 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 Numbness of right foot 11/29/2015  . Elevated hemoglobin A1c 11/29/2015  . Osteopenia 11/29/2015  . Vitamin D insufficiency 11/29/2015  . Shortness of breath 08/03/2014  . Leg edema 08/03/2014  . Palpitations   . Depression   . Deafness in left ear   . Anemia 07/29/2014  . Anxiety 04/08/2013  . Benign essential tremor 04/08/2013  . Carpal tunnel syndrome 04/08/2013   MJanna Arch PT, DPT   MJanna Arch11/21/2018, 8:55 AM  [place] [address] [address]Phone: [phone]  Fax:  [phone] Name: AJOHNETTE TEIGENMRN: [id number] of Birth: [birth year]

## 2016-12-07 DIAGNOSIS — F900 Attention-deficit hyperactivity disorder, predominantly inattentive type: Secondary | ICD-10-CM | POA: Diagnosis not present

## 2016-12-07 DIAGNOSIS — F410 Panic disorder [episodic paroxysmal anxiety] without agoraphobia: Secondary | ICD-10-CM | POA: Diagnosis not present

## 2016-12-07 DIAGNOSIS — F332 Major depressive disorder, recurrent severe without psychotic features: Secondary | ICD-10-CM | POA: Diagnosis not present

## 2016-12-11 DIAGNOSIS — F322 Major depressive disorder, single episode, severe without psychotic features: Secondary | ICD-10-CM | POA: Diagnosis not present

## 2016-12-28 DIAGNOSIS — F900 Attention-deficit hyperactivity disorder, predominantly inattentive type: Secondary | ICD-10-CM | POA: Diagnosis not present

## 2016-12-28 DIAGNOSIS — F332 Major depressive disorder, recurrent severe without psychotic features: Secondary | ICD-10-CM | POA: Diagnosis not present

## 2016-12-28 DIAGNOSIS — F410 Panic disorder [episodic paroxysmal anxiety] without agoraphobia: Secondary | ICD-10-CM | POA: Diagnosis not present

## 2017-01-09 DIAGNOSIS — F322 Major depressive disorder, single episode, severe without psychotic features: Secondary | ICD-10-CM | POA: Diagnosis not present

## 2017-02-05 ENCOUNTER — Ambulatory Visit: Payer: Self-pay | Admitting: Family

## 2017-02-05 ENCOUNTER — Encounter: Payer: Self-pay | Admitting: Family

## 2017-02-05 VITALS — BP 116/57 | HR 105 | Temp 99.5°F | Wt 183.0 lb

## 2017-02-05 DIAGNOSIS — J029 Acute pharyngitis, unspecified: Secondary | ICD-10-CM

## 2017-02-05 DIAGNOSIS — J069 Acute upper respiratory infection, unspecified: Secondary | ICD-10-CM

## 2017-02-05 LAB — POCT RAPID STREP A (OFFICE): RAPID STREP A SCREEN: NEGATIVE

## 2017-02-05 MED ORDER — AZITHROMYCIN 250 MG PO TABS: ORAL_TABLET | ORAL | 0 refills | Status: DC

## 2017-02-05 NOTE — Patient Instructions (Signed)
Upper Respiratory Infection, Adult Most upper respiratory infections (URIs) are a viral infection of the air passages leading to the lungs. A URI affects the nose, throat, and upper air passages. The most common type of URI is nasopharyngitis and is typically referred to as "the common cold." URIs run their course and usually go away on their own. Most of the time, a URI does not require medical attention, but sometimes a bacterial infection in the upper airways can follow a viral infection. This is called a secondary infection. Sinus and middle ear infections are common types of secondary upper respiratory infections. Bacterial pneumonia can also complicate a URI. A URI can worsen asthma and chronic obstructive pulmonary disease (COPD). Sometimes, these complications can require emergency medical care and may be life threatening. What are the causes? Almost all URIs are caused by viruses. A virus is a type of germ and can spread from one person to another. What increases the risk? You may be at risk for a URI if:  You smoke.  You have chronic heart or lung disease.  You have a weakened defense (immune) system.  You are very young or very old.  You have nasal allergies or asthma.  You work in crowded or poorly ventilated areas.  You work in health care facilities or schools.  What are the signs or symptoms? Symptoms typically develop 2-3 days after you come in contact with a cold virus. Most viral URIs last 7-10 days. However, viral URIs from the influenza virus (flu virus) can last 14-18 days and are typically more severe. Symptoms may include:  Runny or stuffy (congested) nose.  Sneezing.  Cough.  Sore throat.  Headache.  Fatigue.  Fever.  Loss of appetite.  Pain in your forehead, behind your eyes, and over your cheekbones (sinus pain).  Muscle aches.  How is this diagnosed? Your health care provider may diagnose a URI by:  Physical exam.  Tests to check that your  symptoms are not due to another condition such as: ? Strep throat. ? Sinusitis. ? Pneumonia. ? Asthma.  How is this treated? A URI goes away on its own with time. It cannot be cured with medicines, but medicines may be prescribed or recommended to relieve symptoms. Medicines may help:  Reduce your fever.  Reduce your cough.  Relieve nasal congestion.  Follow these instructions at home:  Take medicines only as directed by your health care provider.  Gargle warm saltwater or take cough drops to comfort your throat as directed by your health care provider.  Use a warm mist humidifier or inhale steam from a shower to increase air moisture. This may make it easier to breathe.  Drink enough fluid to keep your urine clear or pale yellow.  Eat soups and other clear broths and maintain good nutrition.  Rest as needed.  Return to work when your temperature has returned to normal or as your health care provider advises. You may need to stay home longer to avoid infecting others. You can also use a face mask and careful hand washing to prevent spread of the virus.  Increase the usage of your inhaler if you have asthma.  Do not use any tobacco products, including cigarettes, chewing tobacco, or electronic cigarettes. If you need help quitting, ask your health care provider. How is this prevented? The best way to protect yourself from getting a cold is to practice good hygiene.  Avoid oral or hand contact with people with cold symptoms.  Wash your   hands often if contact occurs.  There is no clear evidence that vitamin C, vitamin E, echinacea, or exercise reduces the chance of developing a cold. However, it is always recommended to get plenty of rest, exercise, and practice good nutrition. Contact a health care provider if:  You are getting worse rather than better.  Your symptoms are not controlled by medicine.  You have chills.  You have worsening shortness of breath.  You have  brown or red mucus.  You have yellow or brown nasal discharge.  You have pain in your face, especially when you bend forward.  You have a fever.  You have swollen neck glands.  You have pain while swallowing.  You have white areas in the back of your throat. Get help right away if:  You have severe or persistent: ? Headache. ? Ear pain. ? Sinus pain. ? Chest pain.  You have chronic lung disease and any of the following: ? Wheezing. ? Prolonged cough. ? Coughing up blood. ? A change in your usual mucus.  You have a stiff neck.  You have changes in your: ? Vision. ? Hearing. ? Thinking. ? Mood. This information is not intended to replace advice given to you by your health care provider. Make sure you discuss any questions you have with your health care provider. Document Released: 06/20/2000 Document Revised: 08/28/2015 Document Reviewed: 04/01/2013 Elsevier Interactive Patient Education  2018 Elsevier Inc.  

## 2017-02-05 NOTE — Progress Notes (Signed)
Subjective:     Patient ID: Carolyn [name], female   DOB: 27-Feb-1975, 42 y.o.   MRN: [id number]  HPI 42 year old female is in today with c/o worsening sore throat with white patches, cough, congestion and feeling fatigue x 3 days. Has been taking Robitussin DM that helps with her symptoms but overall feels worse.   Review of Systems  Constitutional: Positive for chills, fatigue and fever.  HENT: Positive for congestion, postnasal drip, rhinorrhea and sore throat.   Respiratory: Negative.   Cardiovascular: Negative.   Musculoskeletal: Negative.   Allergic/Immunologic: Negative.   Neurological: Negative.   Psychiatric/Behavioral: Negative.    Past Medical History:  Diagnosis Date  . Anemia    a. low-normal H/H  . Anxiety   . Arthritis    osteopenia  . Benign essential tremor    a. bilateral upper and lower extremities; b. followed by [facility]; c. felt to be 2/2 antidepressants   . Carpal tunnel syndrome   . Deafness in left ear   . Depression   . Palpitations     Social History   Socioeconomic History  . Marital status: Married    Spouse name: Not on file  . Number of children: Not on file  . Years of education: Not on file  . Highest education level: Not on file  Social Needs  . Financial resource strain: Not on file  . Food insecurity - worry: Not on file  . Food insecurity - inability: Not on file  . Transportation needs - medical: Not on file  . Transportation needs - non-medical: Not on file  Occupational History  . 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 Smoking status: Never Smoker  . Smokeless tobacco: Never Used  Substance and Sexual Activity  . Alcohol use: No  . Drug use: No  . Sexual activity: Yes    Birth control/protection: Surgical  Other Topics Concern  . Not on file  Social History Narrative  . Not on file    Past Surgical History:  Procedure Laterality Date  . axillary lymph node removal Right 1982   enlarged  lymph node  . IUD REMOVAL N/A 04/23/2014   Procedure: INTRAUTERINE DEVICE (IUD) REMOVAL;  Surgeon: Harold Hedge, MD;  Location: [facility];  Service: Gynecology;  Laterality: N/A;  . LAPAROSCOPIC TUBAL LIGATION Bilateral 04/23/2014   Procedure: LAPAROSCOPIC TUBAL LIGATION with Filshie Clips;  Surgeon: Harold Hedge, MD;  Location: [facility];  Service: Gynecology;  Laterality: Bilateral;    Family History  Problem Relation Age of Onset  . Depression Mother        bipolar disorder  . Arthritis Mother        osteoprosis  . Heart disease Father   . Heart attack Father 18  . Heart disease Paternal Grandmother   . Cancer Paternal Grandmother        breast cancer    Allergies  Allergen Reactions  . Latex Hives, Itching and Rash    sensitivity    Current Outpatient Medications on File Prior to Visit  Medication Sig Dispense Refill  . ARIPiprazole (ABILIFY) 5 MG tablet Take 7.5 mg by mouth daily.    . Cholecalciferol (VITAMIN D3) 5000 units CAPS Take 1 capsule (5,000 Units total) by mouth daily. For 8 weeks, then start Vitamin D3 2,000 units daily (OTC) 60 capsule 0  . clonazePAM (KLONOPIN) 0.5 MG tablet Take 0.5 mg by mouth 2 (two) times daily as needed for anxiety.    Marland Kitchen  estrogens, conjugated, (PREMARIN) 0.625 MG tablet Premarin 0.625 mg tablet    . lamoTRIgine (LAMICTAL) 100 MG tablet   0  . medroxyPROGESTERone (PROVERA) 5 MG tablet medroxyprogesterone 5 mg tablet    . Multiple Vitamins-Minerals (MULTIVITAMIN PO) Take 1 tablet by mouth daily.    . primidone (MYSOLINE) 50 MG tablet Take 100 mg by mouth 2 (two) times daily.    . sertraline (ZOLOFT) 100 MG tablet Take 200 mg by mouth daily.    . traZODone (DESYREL) 150 MG tablet   3  . venlafaxine XR (EFFEXOR-XR) 75 MG 24 hr capsule   0  . acetaZOLAMIDE (DIAMOX) 125 MG tablet Take 1 tablet (125 mg total) by mouth 2 (two) times daily. Start on your 2nd day of ride (Patient not taking: Reported on 10/24/2016) 16 tablet 0  . ALPHA LIPOIC ACID PO  Take 600 mg by mouth daily.      No current facility-administered medications on file prior to visit.     BP (!) 116/57   Pulse (!) 105   Temp 99.5 F (37.5 C)   Wt 183 lb (83 kg)   SpO2 96%   BMI 27.02 kg/m chart     Objective:   Physical Exam  Constitutional: She is oriented to person, place, and time. She appears well-developed and well-nourished.  HENT:  Right Ear: External ear normal.  Left Ear: External ear normal.  Nose: Nose normal.  Moderately red pharynx but no exudate  Neck: Normal range of motion. Neck supple.  Cardiovascular: Normal rate and normal heart sounds.  Pulmonary/Chest: Breath sounds normal.  Musculoskeletal: Normal range of motion.  Neurological: She is alert and oriented to person, place, and time.  Skin: Skin is warm and dry.  Psychiatric: She has a normal mood and affect.       Assessment:     Carolyn [name] was seen today for save-sorethroat-bodyache-yellow mucus-ha.  Diagnoses and all orders for this visit:  Sorethroat -     POCT rapid strep A  Pharyngitis, unspecified etiology  Upper respiratory tract infection, unspecified type  Other orders -     azithromycin (ZITHROMAX) 250 MG tablet; 2 tabs today, then 1 tab daily x 4 more days      Plan:     Call the office with any questions or concerns. Recheck as needed

## 2017-02-12 DIAGNOSIS — F322 Major depressive disorder, single episode, severe without psychotic features: Secondary | ICD-10-CM | POA: Diagnosis not present

## 2017-02-15 DIAGNOSIS — F410 Panic disorder [episodic paroxysmal anxiety] without agoraphobia: Secondary | ICD-10-CM | POA: Diagnosis not present

## 2017-02-15 DIAGNOSIS — F332 Major depressive disorder, recurrent severe without psychotic features: Secondary | ICD-10-CM | POA: Diagnosis not present

## 2017-03-13 DIAGNOSIS — F322 Major depressive disorder, single episode, severe without psychotic features: Secondary | ICD-10-CM | POA: Diagnosis not present

## 2017-03-28 DIAGNOSIS — M9901 Segmental and somatic dysfunction of cervical region: Secondary | ICD-10-CM | POA: Diagnosis not present

## 2017-03-28 DIAGNOSIS — M5412 Radiculopathy, cervical region: Secondary | ICD-10-CM | POA: Diagnosis not present

## 2017-03-28 DIAGNOSIS — M9903 Segmental and somatic dysfunction of lumbar region: Secondary | ICD-10-CM | POA: Diagnosis not present

## 2017-03-28 DIAGNOSIS — M5136 Other intervertebral disc degeneration, lumbar region: Secondary | ICD-10-CM | POA: Diagnosis not present

## 2017-04-02 DIAGNOSIS — M5136 Other intervertebral disc degeneration, lumbar region: Secondary | ICD-10-CM | POA: Diagnosis not present

## 2017-04-02 DIAGNOSIS — M9903 Segmental and somatic dysfunction of lumbar region: Secondary | ICD-10-CM | POA: Diagnosis not present

## 2017-04-02 DIAGNOSIS — M9901 Segmental and somatic dysfunction of cervical region: Secondary | ICD-10-CM | POA: Diagnosis not present

## 2017-04-02 DIAGNOSIS — M5412 Radiculopathy, cervical region: Secondary | ICD-10-CM | POA: Diagnosis not present

## 2017-04-04 DIAGNOSIS — M9903 Segmental and somatic dysfunction of lumbar region: Secondary | ICD-10-CM | POA: Diagnosis not present

## 2017-04-04 DIAGNOSIS — M5412 Radiculopathy, cervical region: Secondary | ICD-10-CM | POA: Diagnosis not present

## 2017-04-04 DIAGNOSIS — M9901 Segmental and somatic dysfunction of cervical region: Secondary | ICD-10-CM | POA: Diagnosis not present

## 2017-04-04 DIAGNOSIS — M5136 Other intervertebral disc degeneration, lumbar region: Secondary | ICD-10-CM | POA: Diagnosis not present

## 2017-04-08 DIAGNOSIS — M5412 Radiculopathy, cervical region: Secondary | ICD-10-CM | POA: Diagnosis not present

## 2017-04-08 DIAGNOSIS — M9903 Segmental and somatic dysfunction of lumbar region: Secondary | ICD-10-CM | POA: Diagnosis not present

## 2017-04-08 DIAGNOSIS — M5136 Other intervertebral disc degeneration, lumbar region: Secondary | ICD-10-CM | POA: Diagnosis not present

## 2017-04-08 DIAGNOSIS — M9901 Segmental and somatic dysfunction of cervical region: Secondary | ICD-10-CM | POA: Diagnosis not present

## 2017-04-10 DIAGNOSIS — M5412 Radiculopathy, cervical region: Secondary | ICD-10-CM | POA: Diagnosis not present

## 2017-04-10 DIAGNOSIS — M9901 Segmental and somatic dysfunction of cervical region: Secondary | ICD-10-CM | POA: Diagnosis not present

## 2017-04-10 DIAGNOSIS — M5136 Other intervertebral disc degeneration, lumbar region: Secondary | ICD-10-CM | POA: Diagnosis not present

## 2017-04-10 DIAGNOSIS — M9903 Segmental and somatic dysfunction of lumbar region: Secondary | ICD-10-CM | POA: Diagnosis not present

## 2017-04-10 DIAGNOSIS — F322 Major depressive disorder, single episode, severe without psychotic features: Secondary | ICD-10-CM | POA: Diagnosis not present

## 2017-04-11 DIAGNOSIS — M9901 Segmental and somatic dysfunction of cervical region: Secondary | ICD-10-CM | POA: Diagnosis not present

## 2017-04-11 DIAGNOSIS — M5136 Other intervertebral disc degeneration, lumbar region: Secondary | ICD-10-CM | POA: Diagnosis not present

## 2017-04-11 DIAGNOSIS — M5412 Radiculopathy, cervical region: Secondary | ICD-10-CM | POA: Diagnosis not present

## 2017-04-11 DIAGNOSIS — M9903 Segmental and somatic dysfunction of lumbar region: Secondary | ICD-10-CM | POA: Diagnosis not present

## 2017-04-12 DIAGNOSIS — F900 Attention-deficit hyperactivity disorder, predominantly inattentive type: Secondary | ICD-10-CM | POA: Diagnosis not present

## 2017-04-12 DIAGNOSIS — F410 Panic disorder [episodic paroxysmal anxiety] without agoraphobia: Secondary | ICD-10-CM | POA: Diagnosis not present

## 2017-04-12 DIAGNOSIS — F332 Major depressive disorder, recurrent severe without psychotic features: Secondary | ICD-10-CM | POA: Diagnosis not present

## 2017-04-15 DIAGNOSIS — M9901 Segmental and somatic dysfunction of cervical region: Secondary | ICD-10-CM | POA: Diagnosis not present

## 2017-04-15 DIAGNOSIS — M5136 Other intervertebral disc degeneration, lumbar region: Secondary | ICD-10-CM | POA: Diagnosis not present

## 2017-04-15 DIAGNOSIS — M9903 Segmental and somatic dysfunction of lumbar region: Secondary | ICD-10-CM | POA: Diagnosis not present

## 2017-04-15 DIAGNOSIS — M5412 Radiculopathy, cervical region: Secondary | ICD-10-CM | POA: Diagnosis not present

## 2017-04-17 DIAGNOSIS — M9903 Segmental and somatic dysfunction of lumbar region: Secondary | ICD-10-CM | POA: Diagnosis not present

## 2017-04-17 DIAGNOSIS — M9901 Segmental and somatic dysfunction of cervical region: Secondary | ICD-10-CM | POA: Diagnosis not present

## 2017-04-17 DIAGNOSIS — M5136 Other intervertebral disc degeneration, lumbar region: Secondary | ICD-10-CM | POA: Diagnosis not present

## 2017-04-17 DIAGNOSIS — M5412 Radiculopathy, cervical region: Secondary | ICD-10-CM | POA: Diagnosis not present

## 2017-04-19 DIAGNOSIS — M9903 Segmental and somatic dysfunction of lumbar region: Secondary | ICD-10-CM | POA: Diagnosis not present

## 2017-04-19 DIAGNOSIS — M9901 Segmental and somatic dysfunction of cervical region: Secondary | ICD-10-CM | POA: Diagnosis not present

## 2017-04-19 DIAGNOSIS — M5136 Other intervertebral disc degeneration, lumbar region: Secondary | ICD-10-CM | POA: Diagnosis not present

## 2017-04-19 DIAGNOSIS — M5412 Radiculopathy, cervical region: Secondary | ICD-10-CM | POA: Diagnosis not present

## 2017-04-22 DIAGNOSIS — M5136 Other intervertebral disc degeneration, lumbar region: Secondary | ICD-10-CM | POA: Diagnosis not present

## 2017-04-22 DIAGNOSIS — M9903 Segmental and somatic dysfunction of lumbar region: Secondary | ICD-10-CM | POA: Diagnosis not present

## 2017-04-22 DIAGNOSIS — M9901 Segmental and somatic dysfunction of cervical region: Secondary | ICD-10-CM | POA: Diagnosis not present

## 2017-04-22 DIAGNOSIS — M5412 Radiculopathy, cervical region: Secondary | ICD-10-CM | POA: Diagnosis not present

## 2017-05-12 ENCOUNTER — Ambulatory Visit (INDEPENDENT_AMBULATORY_CARE_PROVIDER_SITE_OTHER): Payer: 59

## 2017-05-12 ENCOUNTER — Encounter: Payer: Self-pay | Admitting: Emergency Medicine

## 2017-05-12 ENCOUNTER — Ambulatory Visit
Admission: EM | Admit: 2017-05-12 | Discharge: 2017-05-12 | Disposition: A | Discharge status: Home / Self Care | Payer: 59 | Attending: Family Medicine | Admitting: Family Medicine

## 2017-05-12 ENCOUNTER — Other Ambulatory Visit: Payer: Self-pay

## 2017-05-12 DIAGNOSIS — R6884 Jaw pain: Secondary | ICD-10-CM | POA: Diagnosis not present

## 2017-05-12 DIAGNOSIS — S060X0A Concussion without loss of consciousness, initial encounter: Secondary | ICD-10-CM

## 2017-05-12 DIAGNOSIS — S098XXA Other specified injuries of head, initial encounter: Secondary | ICD-10-CM | POA: Diagnosis not present

## 2017-05-12 DIAGNOSIS — S0990XA Unspecified injury of head, initial encounter: Secondary | ICD-10-CM | POA: Diagnosis not present

## 2017-05-12 DIAGNOSIS — W208XXA Other cause of strike by thrown, projected or falling object, initial encounter: Secondary | ICD-10-CM | POA: Diagnosis not present

## 2017-05-12 NOTE — ED Provider Notes (Signed)
[facility]    CSN: [id number] Arrival date & time: 05/12/17  1536     History   Chief Complaint Chief Complaint  Patient presents with  . Headache  . Head Injury  . Jaw Pain    HPI Yvonna L Egolf is a 42 y.o. female.   42 yo female with a c/o head injury when some shelves fell off a cabinet during pottery class and hit her on her head. She denies loss of consciousness, amnesia, vision changes, vomiting, numbness/tingling. Complains of pain to the jaw.    Headache  Head Injury  Associated symptoms: headache     Past Medical History:  Diagnosis Date  . Anemia    a. low-normal H/H  . Anxiety   . Arthritis    osteopenia  . Benign essential tremor    a. bilateral upper and lower extremities; b. followed by [facility]; c. felt to be 2/2 antidepressants   . Carpal tunnel syndrome   . Deafness in left ear   . Depression   . Palpitations     Patient Active Problem List   Diagnosis Date Noted  . Hypertriglyceridemia 05/17/2017  . Perimenopausal 02/13/2016  . 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 Numbness of right foot 11/29/2015  . Elevated hemoglobin A1c 11/29/2015  . Osteopenia 11/29/2015  . Vitamin D insufficiency 11/29/2015  . Shortness of breath 08/03/2014  . Leg edema 08/03/2014  . Palpitations   . Depression   . Deafness in left ear   . Anemia 07/29/2014  . Anxiety 04/08/2013  . Benign essential tremor 04/08/2013  . Carpal tunnel syndrome 04/08/2013    Past Surgical History:  Procedure Laterality Date  . axillary lymph node removal Right 1982   enlarged lymph node  . IUD REMOVAL N/A 04/23/2014   Procedure: INTRAUTERINE DEVICE (IUD) REMOVAL;  Surgeon: Harold Hedge, MD;  Location: [facility];  Service: Gynecology;  Laterality: N/A;  . LAPAROSCOPIC TUBAL LIGATION Bilateral 04/23/2014   Procedure: LAPAROSCOPIC TUBAL LIGATION with Filshie Clips;  Surgeon: Harold Hedge, MD;  Location: [facility];  Service: Gynecology;  Laterality:  Bilateral;    OB History   None      Home Medications    Prior to Admission medications   Medication Sig Start Date End Date Taking? Authorizing Provider  clonazePAM (KLONOPIN) 0.5 MG tablet Take 0.5 mg by mouth 2 (two) times daily as needed for anxiety.   Yes [provider]  estrogens, conjugated, (PREMARIN) 0.625 MG tablet Premarin 0.625 mg tablet   Yes [provider]  medroxyPROGESTERone (PROVERA) 5 MG tablet medroxyprogesterone 5 mg tablet   Yes [provider]  Multiple Vitamins-Minerals (MULTIVITAMIN PO) Take 1 tablet by mouth daily.   Yes [provider]  primidone (MYSOLINE) 50 MG tablet Take 100 mg by mouth 2 (two) times daily.   Yes [provider]  sertraline (ZOLOFT) 100 MG tablet Take 200 mg by mouth daily.   Yes [provider]  venlafaxine XR (EFFEXOR-XR) 75 MG 24 hr capsule  12/31/16  Yes [provider]  Cholecalciferol (VITAMIN D3) 5000 units CAPS Take 1 capsule (5,000 Units total) by mouth daily. For 8 weeks, then start Vitamin D3 2,000 units daily (OTC) 11/29/15   Karamalegos, Netta Neat, DO  cyclobenzaprine (FLEXERIL) 10 MG tablet Take 0.5-1 tablets (5-10 mg total) by mouth 3 (three) times daily as needed for muscle spasms. 05/17/17   Althea Charon, Netta Neat, DO  lamoTRIgine (LAMICTAL) 150 MG tablet  03/04/17  [provider]  traZODone (DESYREL) 100 MG tablet  04/12/17   [provider]  vitamin E 1000 UNIT capsule  05/16/17   [provider]    Family History Family History  Problem Relation Age of Onset  . Depression Mother        bipolar disorder  . Arthritis Mother        osteoprosis  . Heart disease Father   . Heart attack Father 66  . Heart disease Paternal Grandmother   . Cancer Paternal Grandmother        breast cancer    Social History Social History   Tobacco Use  . Smoking status: Never Smoker  . Smokeless tobacco: Never Used  Substance Use Topics    . Alcohol use: No  . Drug use: No     Allergies   Latex   Review of Systems Review of Systems  Neurological: Positive for headaches.     Physical Exam Triage Vital Signs ED Triage Vitals  Enc Vitals Group     BP 05/12/17 1554 111/64     Pulse Rate 05/12/17 1554 67     Resp 05/12/17 1554 14     Temp 05/12/17 1554 98.3 F (36.8 C)     Temp Source 05/12/17 1554 Oral     SpO2 05/12/17 1554 100 %     Weight 05/12/17 1551 183 lb (83 kg)     Height 05/12/17 1551  (1.753 m)     Head Circumference --      Peak Flow --      Pain Score 05/12/17 1551 8     Pain Loc --      Pain Edu? --      Excl. in GC? --    No data found.  Updated Vital Signs BP 111/64 (BP Location: Left Arm)   Pulse 67   Temp 98.3 F (36.8 C) (Oral)   Resp 14   Ht  (1.753 m)   Wt 183 lb (83 kg)   LMP 04/28/2017 (Approximate)   SpO2 100%   BMI 27.02 kg/m   Visual Acuity Right Eye Distance:   Left Eye Distance:   Bilateral Distance:    Right Eye Near:   Left Eye Near:    Bilateral Near:     Physical Exam  Constitutional: She is oriented to person, place, and time. She appears well-developed and well-nourished. No distress.  HENT:  Head: Normocephalic.  Right Ear: Tympanic membrane, external ear and ear canal normal.  Left Ear: Tympanic membrane, external ear and ear canal normal.  Nose: Nose normal.  Mouth/Throat: Oropharynx is clear and moist and mucous membranes are normal.  Eyes: Pupils are equal, round, and reactive to light. Conjunctivae and EOM are normal. Right eye exhibits no discharge. Left eye exhibits no discharge. No scleral icterus.  Neck: Normal range of motion. Neck supple. No JVD present. No tracheal deviation present. No thyromegaly present.  Cardiovascular: Normal rate.  Pulmonary/Chest: Effort normal. No respiratory distress.  Musculoskeletal: She exhibits no edema or tenderness.  Lymphadenopathy:    She has no cervical adenopathy.  Neurological: She is  alert and oriented to person, place, and time. She has normal reflexes. She displays normal reflexes. No cranial nerve deficit or sensory deficit. She exhibits normal muscle tone. Coordination normal. GCS eye subscore is 4. GCS verbal subscore is 5. GCS motor subscore is 6.  Skin: Skin is warm and dry. No rash noted. She is not diaphoretic. No erythema.  No pallor.  Psychiatric: She has a normal mood and affect. Her behavior is normal. Judgment and thought content normal.  Vitals reviewed.    UC Treatments / Results  Labs (all labs ordered are listed, but only abnormal results are displayed) Labs Reviewed - No data to display  EKG None  Radiology No results found.  Procedures Procedures (including critical care time)  Medications Ordered in UC Medications - No data to display  Initial Impression / Assessment and Plan / UC Course  I have reviewed the triage vital signs and the nursing notes.  Pertinent labs & imaging results that were available during my care of the patient were reviewed by me and considered in my medical decision making (see chart for details).      Final Clinical Impressions(s) / UC Diagnoses   Final diagnoses:  Closed head injury, initial encounter  Concussion without loss of consciousness, initial encounter   Discharge Instructions   None    ED Prescriptions    None     1. x-ray results and diagnosis reviewed with patient 3. Recommend supportive treatment with otc analgesics and close monitoring; discussed with patient signs/symptoms to watch for and go to ED if she develops these symptoms 4. Follow-up prn if symptoms worsen or don't improve  Controlled Substance Prescriptions [place] Controlled Substance Registry consulted? Not Applicable   Payton Mccallum, MD 06/05/17 1209

## 2017-05-12 NOTE — ED Triage Notes (Signed)
Patient states that she was at a pottery class at [facility] when some old cabinet doors that were stored up on shelves fell at hit her on top of her head today.  Patient c/o HAs and jaw pain.

## 2017-05-16 ENCOUNTER — Telehealth: Payer: Self-pay | Admitting: Family Medicine

## 2017-05-16 NOTE — Telephone Encounter (Signed)
Pt has something fall on her head Sunday.  She went to [facility] and was told she has a concussion.  She is still has headaches and asked how long they should last [phone]

## 2017-05-16 NOTE — Telephone Encounter (Signed)
Advised patient needs to be seen last seen was 11/2015.

## 2017-05-17 ENCOUNTER — Other Ambulatory Visit: Payer: Self-pay | Admitting: Family Medicine

## 2017-05-17 ENCOUNTER — Other Ambulatory Visit: Payer: Self-pay

## 2017-05-17 ENCOUNTER — Ambulatory Visit (INDEPENDENT_AMBULATORY_CARE_PROVIDER_SITE_OTHER): Payer: 59 | Admitting: Family Medicine

## 2017-05-17 ENCOUNTER — Encounter: Payer: Self-pay | Admitting: Family Medicine

## 2017-05-17 VITALS — BP 110/78 | HR 72 | Ht 69.0 in | Wt 186.0 lb

## 2017-05-17 DIAGNOSIS — R51 Headache: Secondary | ICD-10-CM | POA: Diagnosis not present

## 2017-05-17 DIAGNOSIS — F3342 Major depressive disorder, recurrent, in full remission: Secondary | ICD-10-CM

## 2017-05-17 DIAGNOSIS — D649 Anemia, unspecified: Secondary | ICD-10-CM

## 2017-05-17 DIAGNOSIS — G250 Essential tremor: Secondary | ICD-10-CM

## 2017-05-17 DIAGNOSIS — Z0000 Encounter for general adult medical examination without abnormal findings: Secondary | ICD-10-CM

## 2017-05-17 DIAGNOSIS — M47816 Spondylosis without myelopathy or radiculopathy, lumbar region: Secondary | ICD-10-CM

## 2017-05-17 DIAGNOSIS — S0990XA Unspecified injury of head, initial encounter: Secondary | ICD-10-CM

## 2017-05-17 DIAGNOSIS — E781 Pure hyperglyceridemia: Secondary | ICD-10-CM

## 2017-05-17 DIAGNOSIS — R519 Headache, unspecified: Secondary | ICD-10-CM

## 2017-05-17 DIAGNOSIS — R7309 Other abnormal glucose: Secondary | ICD-10-CM

## 2017-05-17 DIAGNOSIS — E559 Vitamin D deficiency, unspecified: Secondary | ICD-10-CM

## 2017-05-17 DIAGNOSIS — F419 Anxiety disorder, unspecified: Secondary | ICD-10-CM

## 2017-05-17 DIAGNOSIS — E785 Hyperlipidemia, unspecified: Secondary | ICD-10-CM | POA: Insufficient documentation

## 2017-05-17 MED ORDER — CYCLOBENZAPRINE HCL 10 MG PO TABS: 5.0000 mg | ORAL_TABLET | Freq: Three times a day (TID) | ORAL | 0 refills | Status: DC | PRN

## 2017-05-17 NOTE — Patient Instructions (Addendum)
Thank you for coming to the office today.  Recommend to start taking Tylenol Extra Strength  tabs - take 1 to 2 tabs per dose (max ) every 6-8 hours for pain (take regularly, don't skip a dose for next 7 days), max 24 hour daily dose is 6 tablets or . In the future you can repeat the same everyday Tylenol course for 1-2 weeks at a time.   - May add either ibuprofen or naproxen in addition to the tylenol  Can try heating pad or warm moist towel  Start Cyclobenzapine (Flexeril)  tablets (muscle relaxant) - cut in half for  at night for muscle relaxant - may make you sedated or sleepy (be careful driving or working on this) if tolerated you can take half to whole tab 2 to 3 times daily or every 8 hours as needed  If worsening symptoms, pain, vision change, dizzy nauseas, memory loss, numbness weakness tingling, passing out  DUE for FASTING BLOOD WORK (no food or drink after midnight before the lab appointment, only water or coffee without cream/sugar on the morning of)  SCHEDULE "Lab Only" visit in the morning at the clinic for lab draw in 6 WEEKS   - Make sure Lab Only appointment is at about 1 week before your next appointment, so that results will be available  For Lab Results, once available within 2-3 days of blood draw, you can can log in to MyChart online to view your results and a brief explanation. Also, we can discuss results at next follow-up visit.   Please schedule a Follow-up Appointment to: Return in about 6 weeks (around 06/28/2017), or if symptoms worsen or fail to improve, for Annual Physical.  If you have any other questions or concerns, please feel free to call the office or send a message through MyChart. You may also schedule an earlier appointment if necessary.  Additionally, you may be receiving a survey about your experience at our office within a few days to 1 week by e-mail or mail. We value your feedback.  Saralyn Pilar, DO [facility], [place]

## 2017-05-17 NOTE — Progress Notes (Signed)
Subjective:    Patient ID: Carolyn [name], female    DOB: 1975-03-11, 42 y.o.   MRN: [id number]  Carolyn [name] is a 42 y.o. female presenting on 05/17/2017 for Head Injury (pt had a cabinet door to fall about 6 ft and hit her in the head on x 5 days. She went to urgent care and diagnose with concussion. Headache, neck pain, jaw and biltaral ear pain mostly left ear)   HPI  Urgent Care FOLLOW-UP VISIT  Hospital/Location: [facility] Date of Visit: 05/12/17  Reason for Presenting to Urgent Care: Head injury, accidental Primary (+Secondary) Diagnosis: Closed head injury and Concussion without loss of consciousness  FOLLOW-UP - ED provider note is currently incomplete at this time, but the record has been reviewed - Patient presents today about 5 days after recent ED visit. Brief summary of recent course, patient had symptoms of head soreness and pain after injury while at [facility] location, where a cabinet door fell and hit her on top of the head, she was crouched on floor, it did not knock her to ground, she had pain initially and swelling localized, but did not lose consciousness or other symptom at that time. She went immediately to [facility] for evaluation, they were concerned about concussion, X-rays negative for skull or mandibular fracture or other bony injury, no labs or other testing, no treatment rx given, she was given diagnosis of concussion without loss of consciousness.   - Today reports overall has not improved since after discharge. Symptoms of headache and bilateral aspect of head still present. Currently pain is about 7/10 throbbing headache, without improvement. Not worsening. Also admits some jaw pain L > R - Tried Ibuprofen , Excedrin, Naproxen  x 2 - has not tried taking regularly  - New medications on discharge: None - Changes to current meds on discharge: None  Admits sensitivity to light Denies any vision changes, loss of  vision, dizziness, lightheadedness, numbness, weakness, tingling, nausea vomiting, memory loss, fatigue, syncope or near syncope   No flowsheet data found.  Social History   Tobacco Use  . Smoking status: Never Smoker  . Smokeless tobacco: Never Used  Substance Use Topics  . Alcohol use: No  . Drug use: No    Review of Systems Per HPI unless specifically indicated above     Objective:    BP 110/78 (BP Location: Left Arm, Patient Position: Sitting, Cuff Size: Normal)   Pulse 72   Ht  (1.753 m)   Wt 186 lb (84.4 kg)   LMP 04/28/2017 (Approximate)   BMI 27.47 kg/m   Wt Readings from Last 3 Encounters:  05/17/17 186 lb (84.4 kg)  05/12/17 183 lb (83 kg)  02/05/17 183 lb (83 kg)    Physical Exam  Constitutional: She is oriented to person, place, and time. She appears well-developed and well-nourished. No distress.  Well-appearing, comfortable, cooperative  HENT:  Head: Normocephalic and atraumatic.  Mouth/Throat: Oropharynx is clear and moist.  Scalp / Head No evidence of complication from traumatic injury laceration ecchymosis. Mild localized tenderness on top of head with generalized area of possible resolving superficial edema, without fluctuance or other changes.  Frontal / maxillary sinuses non-tender. Bilateral TMs clear without erythema, effusion or bulging - no hemotympanum or tender over mastoid or other bruising. Oropharynx clear without erythema, exudates, edema or asymmetry.  Eyes: Conjunctivae are normal. Right eye exhibits no discharge. Left eye exhibits no discharge.  Neck: Normal range  of motion. Neck supple. No thyromegaly present.  Mild cervical paraspinal muscle hypertonicity. No focal abnormality.  Cardiovascular: Normal rate and intact distal pulses.  Pulmonary/Chest: Effort normal and breath sounds normal. No respiratory distress.  Musculoskeletal: Normal range of motion. She exhibits no edema.  Distal muscle strength intact bilateral extremities  5/5  Lymphadenopathy:    She has no cervical adenopathy.  Neurological: She is alert and oriented to person, place, and time. No cranial nerve deficit or sensory deficit. She exhibits normal muscle tone. Coordination normal.  Distal sensation intact bilateral upper and lower ext.  Skin: Skin is warm and dry. No rash noted. She is not diaphoretic. No erythema.  Psychiatric: She has a normal mood and affect. Her behavior is normal.  Well groomed, good eye contact, normal speech and thoughts  Nursing note and vitals reviewed.  I have personally reviewed the radiology report from [facility] 05/12/17 Mandible and Skull.  CLINICAL DATA:  Heavy object fell on patient's head today. Bilateral jaw pain, initial encounter.  EXAM: MANDIBLE - 1-3 VIEW  COMPARISON:  None.  FINDINGS: Mandible and temporomandibular joints appear grossly intact.  IMPRESSION: No acute findings.   Electronically Signed   By: Leanna Battles M.D.   On: 05/12/2017 17:37  ------------------  CLINICAL DATA:  Heavy object fell on patient's head today, bilateral jaw pain, left head pain, initial encounter.  EXAM: SKULL - COMPLETE 4 + VIEW  COMPARISON:  None.  FINDINGS: No acute osseous abnormality.  Paranasal sinuses are grossly clear.  IMPRESSION: No acute findings.   Electronically Signed   By: Leanna Battles M.D.   On: 05/12/2017 17:36      Assessment & Plan:   Problem List Items Addressed This Visit    None    Visit Diagnoses    Traumatic injury of head, initial encounter    -  Primary   Relevant Medications   cyclobenzaprine (FLEXERIL) 10 MG tablet   Acute nonintractable headache, unspecified headache type       Relevant Medications   lamoTRIgine (LAMICTAL) 150 MG tablet   traZODone (DESYREL) 100 MG tablet   cyclobenzaprine (FLEXERIL) 10 MG tablet      Clinically with persistent localized headache daily for 5 days after traumatic closed head injury accidental from  fallen object, seems to be more contusion. Uncertain if actual concussion, based on her history with limited other neuro symptoms, this was dx at Urgent Care recently. Reassuring exam and negative x-rays - Suspect a lot of her symptoms persisting may be due to muscle spasm or whiplash from unexpected blunt injury to skull  Plan Start Flexeril 5-10mg  TID PRN, headache and pain, caution sedation Recommend to increase trial of Tylenol Ext Str  x 2 for  TID then PRN in future May add NSAID stick one and continue at high dose regularly for few days as well Use ice/heat as needed Relative rest Strict follow-up if any significant clinical worsening or new neurological symptoms, may determine possible concussion based on course of her symptoms  Will return sooner for labs and annual phys   Meds ordered this encounter  Medications  . cyclobenzaprine (FLEXERIL) 10 MG tablet    Sig: Take 0.5-1 tablets (5-10 mg total) by mouth 3 (three) times daily as needed for muscle spasms.    Dispense:  30 tablet    Refill:  0    Follow up plan: Return in about 6 weeks (around 06/28/2017), or if symptoms worsen or fail to improve, for Annual  Physical.  Future labs ordered for 06/27/17  Saralyn Pilar, DO [facility] 05/17/2017, 3:29 PM

## 2017-05-23 DIAGNOSIS — F332 Major depressive disorder, recurrent severe without psychotic features: Secondary | ICD-10-CM | POA: Diagnosis not present

## 2017-05-23 DIAGNOSIS — F900 Attention-deficit hyperactivity disorder, predominantly inattentive type: Secondary | ICD-10-CM | POA: Diagnosis not present

## 2017-05-23 DIAGNOSIS — F410 Panic disorder [episodic paroxysmal anxiety] without agoraphobia: Secondary | ICD-10-CM | POA: Diagnosis not present

## 2017-06-17 DIAGNOSIS — F900 Attention-deficit hyperactivity disorder, predominantly inattentive type: Secondary | ICD-10-CM | POA: Diagnosis not present

## 2017-06-17 DIAGNOSIS — F410 Panic disorder [episodic paroxysmal anxiety] without agoraphobia: Secondary | ICD-10-CM | POA: Diagnosis not present

## 2017-06-17 DIAGNOSIS — F332 Major depressive disorder, recurrent severe without psychotic features: Secondary | ICD-10-CM | POA: Diagnosis not present

## 2017-06-19 DIAGNOSIS — F322 Major depressive disorder, single episode, severe without psychotic features: Secondary | ICD-10-CM | POA: Diagnosis not present

## 2017-06-26 ENCOUNTER — Other Ambulatory Visit: Payer: Self-pay

## 2017-06-26 DIAGNOSIS — Z0000 Encounter for general adult medical examination without abnormal findings: Secondary | ICD-10-CM

## 2017-06-26 DIAGNOSIS — M5136 Other intervertebral disc degeneration, lumbar region: Secondary | ICD-10-CM | POA: Diagnosis not present

## 2017-06-26 DIAGNOSIS — M9903 Segmental and somatic dysfunction of lumbar region: Secondary | ICD-10-CM | POA: Diagnosis not present

## 2017-06-26 DIAGNOSIS — E781 Pure hyperglyceridemia: Secondary | ICD-10-CM

## 2017-06-26 DIAGNOSIS — M5412 Radiculopathy, cervical region: Secondary | ICD-10-CM | POA: Diagnosis not present

## 2017-06-26 DIAGNOSIS — D649 Anemia, unspecified: Secondary | ICD-10-CM

## 2017-06-26 DIAGNOSIS — F3342 Major depressive disorder, recurrent, in full remission: Secondary | ICD-10-CM

## 2017-06-26 DIAGNOSIS — E559 Vitamin D deficiency, unspecified: Secondary | ICD-10-CM

## 2017-06-26 DIAGNOSIS — R7309 Other abnormal glucose: Secondary | ICD-10-CM

## 2017-06-26 DIAGNOSIS — M9901 Segmental and somatic dysfunction of cervical region: Secondary | ICD-10-CM | POA: Diagnosis not present

## 2017-06-27 ENCOUNTER — Other Ambulatory Visit: Payer: 59

## 2017-06-27 DIAGNOSIS — F3342 Major depressive disorder, recurrent, in full remission: Secondary | ICD-10-CM | POA: Diagnosis not present

## 2017-06-27 DIAGNOSIS — E781 Pure hyperglyceridemia: Secondary | ICD-10-CM | POA: Diagnosis not present

## 2017-06-27 DIAGNOSIS — E559 Vitamin D deficiency, unspecified: Secondary | ICD-10-CM | POA: Diagnosis not present

## 2017-06-27 DIAGNOSIS — R7309 Other abnormal glucose: Secondary | ICD-10-CM | POA: Diagnosis not present

## 2017-06-28 LAB — COMPLETE METABOLIC PANEL WITH GFR
AG Ratio: 1.6 (calc) (ref 1.0–2.5)
ALKALINE PHOSPHATASE (APISO): 55 U/L (ref 33–115)
ALT: 12 U/L (ref 6–29)
AST: 13 U/L (ref 10–30)
Albumin: 4.2 g/dL (ref 3.6–5.1)
BUN: 11 mg/dL (ref 7–25)
CO2: 31 mmol/L (ref 20–32)
CREATININE: 0.73 mg/dL (ref 0.50–1.10)
Calcium: 9.7 mg/dL (ref 8.6–10.2)
Chloride: 103 mmol/L (ref 98–110)
GFR, Est African American: 116 mL/min/{1.73_m2} (ref >=60)
GFR, Est Non African American: 100 mL/min/{1.73_m2} (ref >=60)
GLUCOSE: 93 mg/dL (ref 65–99)
Globulin: 2.6 g/dL (calc) (ref 1.9–3.7)
Potassium: 4.5 mmol/L (ref 3.5–5.3)
SODIUM: 139 mmol/L (ref 135–146)
Total Bilirubin: 0.5 mg/dL (ref 0.2–1.2)
Total Protein: 6.8 g/dL (ref 6.1–8.1)

## 2017-06-28 LAB — CBC WITH DIFFERENTIAL/PLATELET
BASOS PCT: 0.8 %
Basophils Absolute: 50 cells/uL (ref 0–200)
Eosinophils Absolute: 57 cells/uL (ref 15–500)
Eosinophils Relative: 0.9 %
HCT: 37.2 % (ref 35.0–45.0)
Hemoglobin: 12.7 g/dL (ref 11.7–15.5)
LYMPHS ABS: 2161 {cells}/uL (ref 850–3900)
MCH: 32.2 pg (ref 27.0–33.0)
MCHC: 34.1 g/dL (ref 32.0–36.0)
MCV: 94.2 fL (ref 80.0–100.0)
MPV: 9.7 fL (ref 7.5–12.5)
Monocytes Relative: 5.7 %
Neutro Abs: 3673 cells/uL (ref 1500–7800)
Neutrophils Relative %: 58.3 %
PLATELETS: 328 10*3/uL (ref 140–400)
RBC: 3.95 10*6/uL (ref 3.80–5.10)
RDW: 11.3 % (ref 11.0–15.0)
TOTAL LYMPHOCYTE: 34.3 %
WBC mixed population: 359 cells/uL (ref 200–950)
WBC: 6.3 10*3/uL (ref 3.8–10.8)

## 2017-06-28 LAB — LIPID PANEL
CHOL/HDL RATIO: 2.6 (calc) (ref <5.0)
Cholesterol: 174 mg/dL (ref <200)
HDL: 67 mg/dL (ref >50)
LDL CHOLESTEROL (CALC): 84 mg/dL
Non-HDL Cholesterol (Calc): 107 mg/dL (calc) (ref <130)
Triglycerides: 136 mg/dL (ref <150)

## 2017-06-28 LAB — VITAMIN D 25 HYDROXY (VIT D DEFICIENCY, FRACTURES): Vit D, 25-Hydroxy: 44 ng/mL (ref 30–100)

## 2017-06-28 LAB — HEMOGLOBIN A1C
HEMOGLOBIN A1C: 4.9 %{Hb} (ref <5.7)
MEAN PLASMA GLUCOSE: 94 (calc)
eAG (mmol/L): 5.2 (calc)

## 2017-06-28 LAB — TSH: TSH: 0.62 mIU/L

## 2017-07-01 DIAGNOSIS — M9901 Segmental and somatic dysfunction of cervical region: Secondary | ICD-10-CM | POA: Diagnosis not present

## 2017-07-01 DIAGNOSIS — M5136 Other intervertebral disc degeneration, lumbar region: Secondary | ICD-10-CM | POA: Diagnosis not present

## 2017-07-01 DIAGNOSIS — M9903 Segmental and somatic dysfunction of lumbar region: Secondary | ICD-10-CM | POA: Diagnosis not present

## 2017-07-01 DIAGNOSIS — M5412 Radiculopathy, cervical region: Secondary | ICD-10-CM | POA: Diagnosis not present

## 2017-07-02 ENCOUNTER — Ambulatory Visit (INDEPENDENT_AMBULATORY_CARE_PROVIDER_SITE_OTHER): Payer: 59 | Admitting: Family Medicine

## 2017-07-02 ENCOUNTER — Other Ambulatory Visit: Payer: Self-pay | Admitting: Family Medicine

## 2017-07-02 ENCOUNTER — Encounter: Payer: Self-pay | Admitting: Family Medicine

## 2017-07-02 VITALS — BP 107/64 | HR 102 | Temp 99.5°F | Resp 16 | Ht 69.0 in | Wt 183.0 lb

## 2017-07-02 DIAGNOSIS — R7309 Other abnormal glucose: Secondary | ICD-10-CM | POA: Diagnosis not present

## 2017-07-02 DIAGNOSIS — M47816 Spondylosis without myelopathy or radiculopathy, lumbar region: Secondary | ICD-10-CM

## 2017-07-02 DIAGNOSIS — Z0000 Encounter for general adult medical examination without abnormal findings: Secondary | ICD-10-CM

## 2017-07-02 DIAGNOSIS — F3342 Major depressive disorder, recurrent, in full remission: Secondary | ICD-10-CM

## 2017-07-02 DIAGNOSIS — E559 Vitamin D deficiency, unspecified: Secondary | ICD-10-CM

## 2017-07-02 DIAGNOSIS — M8580 Other specified disorders of bone density and structure, unspecified site: Secondary | ICD-10-CM

## 2017-07-02 DIAGNOSIS — E781 Pure hyperglyceridemia: Secondary | ICD-10-CM | POA: Diagnosis not present

## 2017-07-02 NOTE — Assessment & Plan Note (Signed)
Stable, on DEXA 2016. No fractures H/o Vit D insufficiency, s/p treatment Continue Calcium Vit D supplements  Due next DEXA Fall 2019 per GYN - f/u results

## 2017-07-02 NOTE — Assessment & Plan Note (Signed)
Improved Vit D now normalized Reduce dose from 5k daily down to 1-2k daily for maintenance Check yearly

## 2017-07-02 NOTE — Assessment & Plan Note (Signed)
Mostly controlled cholesterol on improving lifestyle High risk med with Abilify Last lipid panel 06/2017  Plan: Encourage improved lifestyle - low carb/cholesterol, reduce portion size, continue improving regular exercise F/u yearly lipid

## 2017-07-02 NOTE — Assessment & Plan Note (Signed)
Currently stable chronic problem, on med regimen Managed by Psychiatry - Note prior failure off Abilify, concern w/ metabolic problem on med, now back on med future they are considering switch to Rexulti

## 2017-07-02 NOTE — Patient Instructions (Addendum)
Thank you for coming to the office today.  Keep up the great work overall.  We will charge preventative wellness code today - make sure GYN does not charge this as well in future in 2019  Also, if you can request copy of last or next Mammogram / DEXA for our records, to be faxed  Keep me posted on Psychiatry med updates  DUE for FASTING BLOOD WORK (no food or drink after midnight before the lab appointment, only water or coffee without cream/sugar on the morning of)  SCHEDULE "Lab Only" visit in the morning at the clinic for lab draw in 1 YEAR  - Make sure Lab Only appointment is at about 1 week before your next appointment, so that results will be available  For Lab Results, once available within 2-3 days of blood draw, you can can log in to MyChart online to view your results and a brief explanation. Also, we can discuss results at next follow-up visit.   Please schedule a Follow-up Appointment to: Return in about 1 year (around 07/03/2018) for Annual Physical.  If you have any other questions or concerns, please feel free to call the office or send a message through MyChart. You may also schedule an earlier appointment if necessary.  Additionally, you may be receiving a survey about your experience at our office within a few days to 1 week by e-mail or mail. We value your feedback.  Saralyn PilarAlexander Karamalegos, DO [facility], [place]

## 2017-07-02 NOTE — Assessment & Plan Note (Signed)
Resolved elevated A1c now 4.9 Suspect worse A1c secondary to Abilify, now back on med, at risk Continue f/u with Psych for med adjust While on med - encourage diet exercise lifestyle changes to combat metabolic effects Follow-up 6 months if concern with wt gain or lifestyle or med side effect for A1c check otherwise yearly with labs

## 2017-07-02 NOTE — Progress Notes (Signed)
Subjective:    Patient ID: Carolyn [name], female    DOB: 01/05/1976, 42 y.o.   MRN: [id number]  Carolyn [name] is a 42 y.o. female presenting on 07/02/2017 for Annual Exam   HPI   Here for Annual Physical and Lab Review  Specialists Psychiatry - Caryn SectionAarti Kapur, MD Neurology - Cristopher PeruHemang Shah, MD [facility] (Dr Rosita KeaMenz)  Chronic Major Depression, Remission - Chronic problem followed by Psych, per chart review has been on variety of trials of many medications for mood including Zoloft, Lexapro, Fluoxetine, Celexa, Duloxetine, Venlafaxine, lamictal, risperdal, lithium, latuda - Interval update - they tried to stop Abilify but did not do well, had significant depression, and she was restarted, they had concerns on elevated lipid/A1c due to Abilify metabolic side effect, they will consider Rexulti in future. - Currently taking Zoloft 200mg  daily, Trazodone 150mg  nightly, Abilify 5mg  daily - also infrequent clonazepam 0.5mg  maybe once weekly  Essential Tremor, Chronic Followed by Dr [facility]ryll [facility]), last visit 11/2016, now on yearly f/u - Chronic problem with tremors in bilateral hands, legs >6 years, see [facility], continues on taking Primidone 100 AM and 50mg  PM - Prior numbness in R foot has improved, took alpha lipoic acid  Elevated A1c Last result 4.9 (06/2017), dramatic improvement from prior 5.6 in 2017. No known PreDM or DM. She does not have fam history of DM. - Thought that abilify medication for psych was affecting her increased sugar. - She has concerns about restarting med for this purpose but admits lifestyle improvement needed  Vitamin D Insufficiency - Prior history of deficiency - last result 42 (06/2017), was taking Vitamin D3 5k daily, now will transition to lower dose  Additional update - Has remaining Flexeril - stopped taking after prior head injury, now has PRN if need for back.  Health Maintenance:  Followed  by GYN ([facility]) they perform yearly screening including mammogram, next due Fall 2019, and also DEXA, previous dx osteopenia, now due in 2019 for repeat DEXA - requested fax result for records - Fam history of breast cancer see below  No known fam history of colon cancer. Declines screening at age 42, she will reconsider for age 42.   Depression screen [facility] 07/02/2017  Decreased Interest 0  Down, Depressed, Hopeless 1  PHQ - 2 Score 1  Altered sleeping 0  Tired, decreased energy 1  Change in appetite 0  Feeling bad or failure about yourself  0  Trouble concentrating 1  Moving slowly or fidgety/restless 0  Suicidal thoughts 0  PHQ-9 Score 3  Difficult doing work/chores Not difficult at all     GAD 7 : Generalized Anxiety Score 07/02/2017  Nervous, Anxious, on Edge 0  Control/stop worrying 0  Worry too much - different things 0  Trouble relaxing 1  Restless 0  Easily annoyed or irritable 0  Afraid - awful might happen 0  Total GAD 7 Score 1  Anxiety Difficulty Not difficult at all    Past Medical History:  Diagnosis Date  . Anemia    a. low-normal H/H  . Anxiety   . Arthritis    osteopenia  . Benign essential tremor    a. bilateral upper and lower extremities; [facility]; c. felt to be 2/2 antidepressants   . Carpal tunnel syndrome   . Deafness in left ear   . Palpitations    Past Surgical History:  Procedure Laterality Date  .  axillary lymph node removal Right 1982   enlarged lymph node  . IUD REMOVAL N/A 04/23/2014   Procedure: INTRAUTERINE DEVICE (IUD) REMOVAL;  Surgeon: Harold Hedge, MD;  Location: [facility];  Service: Gynecology;  Laterality: N/A;  . LAPAROSCOPIC TUBAL LIGATION Bilateral 04/23/2014   Procedure: LAPAROSCOPIC TUBAL LIGATION with Filshie Clips;  Surgeon: Harold Hedge, MD;  Location: [facility];  Service: Gynecology;  Laterality: Bilateral;   Social History   Socioeconomic History  . Marital status: Married     Spouse name: Not on file  . Number of children: Not on file  . Years of education: Not on file  . Highest education level: Not on file  Occupational History  . 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 Financial resource strain: Not on file  . Food insecurity:    Worry: Not on file    Inability: Not on file  . Transportation needs:    Medical: Not on file    Non-medical: Not on file  Tobacco Use  . Smoking status: Never Smoker  . Smokeless tobacco: Never Used  Substance and Sexual Activity  . Alcohol use: Yes    Comment: occ  . Drug use: No  . Sexual activity: Yes    Birth control/protection: Surgical  Lifestyle  . Physical activity:    Days per week: Not on file    Minutes per session: Not on file  . Stress: Not on file  Relationships  . Social connections:    Talks on phone: Not on file    Gets together: Not on file    Attends religious service: Not on file    Active member of club or organization: Not on file    Attends meetings of clubs or organizations: Not on file    Relationship status: Not on file  . Intimate partner violence:    Fear of current or ex partner: Not on file    Emotionally abused: Not on file    Physically abused: Not on file    Forced sexual activity: Not on file  Other Topics Concern  . Not on file  Social History Narrative  . Not on file   Family History  Problem Relation Age of Onset  . Depression Mother        bipolar disorder  . Arthritis Mother        osteoprosis  . Heart disease Father   . Heart attack Father 63  . Heart disease Paternal Grandmother   . Cancer Paternal Grandmother        breast cancer  . Colon cancer Neg Hx    Current Outpatient Medications on File Prior to Visit  Medication Sig  . ARIPiprazole (ABILIFY) 5 MG tablet   . Cholecalciferol (VITAMIN D3) 2000 units TABS Take 2,000 Units by mouth daily.  . clonazePAM (KLONOPIN) 0.5 MG tablet Take 0.5 mg by mouth 2 (two) times  daily as needed for anxiety.  Marland Kitchen estrogens, conjugated, (PREMARIN) 0.625 MG tablet Premarin 0.625 mg tablet  . medroxyPROGESTERone (PROVERA) 5 MG tablet medroxyprogesterone 5 mg tablet  . Multiple Vitamins-Minerals (MULTIVITAMIN PO) Take 1 tablet by mouth daily.  . primidone (MYSOLINE) 50 MG tablet Take 100 mg by mouth 2 (two) times daily.  . sertraline (ZOLOFT) 100 MG tablet Take 200 mg by mouth daily.  . traZODone (DESYREL) 100 MG tablet   . vitamin E 1000 UNIT capsule    No current facility-administered medications on file prior to visit.  Review of Systems  Constitutional: Negative for activity change, appetite change, chills, diaphoresis, fatigue and fever.  HENT: Positive for congestion. Negative for hearing loss.   Eyes: Negative for visual disturbance.  Respiratory: Negative for apnea, cough, choking, chest tightness, shortness of breath and wheezing.   Cardiovascular: Negative for chest pain, palpitations and leg swelling.  Gastrointestinal: Positive for constipation (currently none - takes PRN Dulcolax with relief). Negative for abdominal pain, anal bleeding, blood in stool, diarrhea, nausea and vomiting.  Endocrine: Negative for cold intolerance.  Genitourinary: Positive for frequency (if increased water intake). Negative for difficulty urinating, dysuria and hematuria.  Musculoskeletal: Negative for arthralgias, back pain and neck pain.  Skin: Negative for rash.  Allergic/Immunologic: Positive for environmental allergies.  Neurological: Negative for dizziness, weakness, light-headedness, numbness and headaches.  Hematological: Negative for adenopathy.  Psychiatric/Behavioral: Positive for dysphoric mood (Controlled). Negative for behavioral problems, self-injury, sleep disturbance and suicidal ideas. The patient is nervous/anxious (Controlled).    Per HPI unless specifically indicated above     Objective:    BP 107/64   Pulse (!) 102   Temp 99.5 F (37.5 C) (Oral)    Resp 16   Ht 5\' 9"  (1.753 m)   Wt 183 lb (83 kg)   BMI 27.02 kg/m   Wt Readings from Last 3 Encounters:  07/02/17 183 lb (83 kg)  05/17/17 186 lb (84.4 kg)  05/12/17 183 lb (83 kg)    Physical Exam  Constitutional: She is oriented to person, place, and time. She appears well-developed and well-nourished. No distress.  Well-appearing, comfortable, cooperative  HENT:  Head: Normocephalic and atraumatic.  Mouth/Throat: Oropharynx is clear and moist.  Frontal / maxillary sinuses non-tender. Nares patent without purulence or edema. Bilateral TMs clear without erythema, effusion or bulging. Oropharynx clear without erythema, exudates, edema or asymmetry.  Deafness Left ear - chronic  Eyes: Pupils are equal, round, and reactive to light. Conjunctivae and EOM are normal. Right eye exhibits no discharge. Left eye exhibits no discharge.  Neck: Normal range of motion. Neck supple. No thyromegaly present.  Cardiovascular: Regular rhythm, normal heart sounds and intact distal pulses.  No murmur heard. Tachycardic  Pulmonary/Chest: Effort normal and breath sounds normal. No respiratory distress. She has no wheezes. She has no rales.  Abdominal: Soft. Bowel sounds are normal. She exhibits no distension and no mass. There is no tenderness.  Musculoskeletal: Normal range of motion. She exhibits no edema or tenderness.  Upper / Lower Extremities: - Normal muscle tone, strength bilateral upper extremities 5/5, lower extremities 5/5  Lymphadenopathy:    She has no cervical adenopathy.  Neurological: She is alert and oriented to person, place, and time.  Distal sensation intact to light touch all extremities  Skin: Skin is warm and dry. No rash noted. She is not diaphoretic. No erythema.  Psychiatric: She has a normal mood and affect. Her behavior is normal.  Well groomed, good eye contact, normal speech and thoughts  Nursing note and vitals reviewed.  Results for orders placed or performed in visit  on 06/26/17  VITAMIN D 25 Hydroxy (Vit-D Deficiency, Fractures)  Result Value Ref Range   Vit D, 25-Hydroxy 44 30 - 100 ng/mL  TSH  Result Value Ref Range   TSH 0.62 mIU/L  Lipid panel  Result Value Ref Range   Cholesterol 174 <200 mg/dL   HDL 67 >86 mg/dL   Triglycerides 578 <469 mg/dL   LDL Cholesterol (Calc) 84 mg/dL (calc)   Total CHOL/HDL Ratio 2.6 <  5.0 (calc)   Non-HDL Cholesterol (Calc) 107 <130 mg/dL (calc)  COMPLETE METABOLIC PANEL WITH GFR  Result Value Ref Range   Glucose, Bld 93 65 - 99 mg/dL   BUN 11 7 - 25 mg/dL   Creat 1.61 0.96 - 0.45 mg/dL   GFR, Est Non African American 100 > OR = 60 mL/min/1.79m2   GFR, Est African American 116 > OR = 60 mL/min/1.35m2   BUN/Creatinine Ratio NOT APPLICABLE 6 - 22 (calc)   Sodium 139 135 - 146 mmol/L   Potassium 4.5 3.5 - 5.3 mmol/L   Chloride 103 98 - 110 mmol/L   CO2 31 20 - 32 mmol/L   Calcium 9.7 8.6 - 10.2 mg/dL   Total Protein 6.8 6.1 - 8.1 g/dL   Albumin 4.2 3.6 - 5.1 g/dL   Globulin 2.6 1.9 - 3.7 g/dL (calc)   AG Ratio 1.6 1.0 - 2.5 (calc)   Total Bilirubin 0.5 0.2 - 1.2 mg/dL   Alkaline phosphatase (APISO) 55 33 - 115 U/L   AST 13 10 - 30 U/L   ALT 12 6 - 29 U/L  CBC with Differential/Platelet  Result Value Ref Range   WBC 6.3 3.8 - 10.8 Thousand/uL   RBC 3.95 3.80 - 5.10 Million/uL   Hemoglobin 12.7 11.7 - 15.5 g/dL   HCT 40.9 81.1 - 91.4 %   MCV 94.2 80.0 - 100.0 fL   MCH 32.2 27.0 - 33.0 pg   MCHC 34.1 32.0 - 36.0 g/dL   RDW 78.2 95.6 - 21.3 %   Platelets 328 140 - 400 Thousand/uL   MPV 9.7 7.5 - 12.5 fL   Neutro Abs 3,673 1,500 - 7,800 cells/uL   Lymphs Abs 2,161 850 - 3,900 cells/uL   WBC mixed population 359 200 - 950 cells/uL   Eosinophils Absolute 57 15 - 500 cells/uL   Basophils Absolute 50 0 - 200 cells/uL   Neutrophils Relative % 58.3 %   Total Lymphocyte 34.3 %   Monocytes Relative 5.7 %   Eosinophils Relative 0.9 %   Basophils Relative 0.8 %  Hemoglobin A1c  Result Value Ref Range    Hgb A1c MFr Bld 4.9 <5.7 % of total Hgb   Mean Plasma Glucose 94 (calc)   eAG (mmol/L) 5.2 (calc)      Assessment & Plan:   Problem List Items Addressed This Visit    Elevated hemoglobin A1c    Resolved elevated A1c now 4.9 Suspect worse A1c secondary to Abilify, now back on med, at risk Continue f/u with Psych for med adjust While on med - encourage diet exercise lifestyle changes to combat metabolic effects Follow-up 6 months if concern with wt gain or lifestyle or med side effect for A1c check otherwise yearly with labs      Hypertriglyceridemia    Mostly controlled cholesterol on improving lifestyle High risk med with Abilify Last lipid panel 06/2017  Plan: Encourage improved lifestyle - low carb/cholesterol, reduce portion size, continue improving regular exercise F/u yearly lipid      Osteopenia    Stable, on DEXA 2016. No fractures H/o Vit D insufficiency, s/p treatment Continue Calcium Vit D supplements  Due next DEXA Fall 2019 per GYN - f/u results      Recurrent major depression in full remission (HCC)    Currently stable chronic problem, on med regimen Managed by Psychiatry - Note prior failure off Abilify, concern w/ metabolic problem on med, now back on med future they are considering switch  to Rexulti      Vitamin D insufficiency    Improved Vit D now normalized Reduce dose from 5k daily down to 1-2k daily for maintenance Check yearly       Other Visit Diagnoses    Annual physical exam    -  Primary    Updated Health Maintenance information - request updates from GYN Reviewed recent lab results with patient Encouraged improvement to lifestyle with diet and exercise    No orders of the defined types were placed in this encounter.   Follow up plan: Return in about 1 year (around 07/03/2018) for Annual Physical.  Future labs ordered for 06/2018  Saralyn Pilar, DO [facility] 07/02/2017, 10:10  PM

## 2017-07-03 DIAGNOSIS — M9901 Segmental and somatic dysfunction of cervical region: Secondary | ICD-10-CM | POA: Diagnosis not present

## 2017-07-03 DIAGNOSIS — M5412 Radiculopathy, cervical region: Secondary | ICD-10-CM | POA: Diagnosis not present

## 2017-07-03 DIAGNOSIS — M5136 Other intervertebral disc degeneration, lumbar region: Secondary | ICD-10-CM | POA: Diagnosis not present

## 2017-07-03 DIAGNOSIS — M9903 Segmental and somatic dysfunction of lumbar region: Secondary | ICD-10-CM | POA: Diagnosis not present

## 2017-07-08 DIAGNOSIS — M9901 Segmental and somatic dysfunction of cervical region: Secondary | ICD-10-CM | POA: Diagnosis not present

## 2017-07-08 DIAGNOSIS — M9903 Segmental and somatic dysfunction of lumbar region: Secondary | ICD-10-CM | POA: Diagnosis not present

## 2017-07-08 DIAGNOSIS — M5136 Other intervertebral disc degeneration, lumbar region: Secondary | ICD-10-CM | POA: Diagnosis not present

## 2017-07-08 DIAGNOSIS — M5412 Radiculopathy, cervical region: Secondary | ICD-10-CM | POA: Diagnosis not present

## 2017-07-09 DIAGNOSIS — F332 Major depressive disorder, recurrent severe without psychotic features: Secondary | ICD-10-CM | POA: Diagnosis not present

## 2017-07-09 DIAGNOSIS — F900 Attention-deficit hyperactivity disorder, predominantly inattentive type: Secondary | ICD-10-CM | POA: Diagnosis not present

## 2017-07-09 DIAGNOSIS — F410 Panic disorder [episodic paroxysmal anxiety] without agoraphobia: Secondary | ICD-10-CM | POA: Diagnosis not present

## 2017-07-30 DIAGNOSIS — F322 Major depressive disorder, single episode, severe without psychotic features: Secondary | ICD-10-CM | POA: Diagnosis not present

## 2017-08-06 DIAGNOSIS — F900 Attention-deficit hyperactivity disorder, predominantly inattentive type: Secondary | ICD-10-CM | POA: Diagnosis not present

## 2017-08-06 DIAGNOSIS — F332 Major depressive disorder, recurrent severe without psychotic features: Secondary | ICD-10-CM | POA: Diagnosis not present

## 2017-08-06 DIAGNOSIS — F410 Panic disorder [episodic paroxysmal anxiety] without agoraphobia: Secondary | ICD-10-CM | POA: Diagnosis not present

## 2017-08-16 NOTE — Progress Notes (Signed)
08/19/2017 10:22 AM   Carolyn [name] 14-Jan-1975 [id number]  Referring provider: Smitty Cords, DO [address] [place], [place] [address]  Chief Complaint  Patient presents with  . Urinary Urgency    HPI: Patient is a 42 -year-old Caucasian female who presents today for the complaint of urinary frequency.    She is having associated strong urgency and urge and stress incontinence.  Patient denies any gross hematuria, dysuria or suprapubic/flank pain.  Patient denies any fevers, chills, nausea or vomiting.   This has been worsening over the last several months.  She has not noted anything that makes her urinary symptoms worse or better.   She does not have a history of urinary tract infections, STI's or injury to the bladder.   She does not have a history of nephrolithiasis, GU surgery or GU trauma.   She admits to constipation.    She is drinking 3 to 4 thirty ounce bottles of water daily.   She drinks one to two cups of coffee daily.  Occasional tea.  No sodas.  No juices.  She has an occasional beer with meals.      Her PVR is 0 mL.  Her UA is negative for infection.      PMH: Past Medical History:  Diagnosis Date  . Anemia    a. low-normal H/H  . Anxiety   . Arthritis    osteopenia  . Benign essential tremor    a. bilateral upper and lower extremities; b. followed by [facility]; c. felt to be 2/2 antidepressants   . Carpal tunnel syndrome   . Deafness in left ear   . Palpitations     Surgical History: Past Surgical History:  Procedure Laterality Date  . axillary lymph node removal Right 1982   enlarged lymph node  . IUD REMOVAL N/A 04/23/2014   Procedure: INTRAUTERINE DEVICE (IUD) REMOVAL;  Surgeon: Harold Hedge, MD;  Location: [facility];  Service: Gynecology;  Laterality: N/A;  . LAPAROSCOPIC TUBAL LIGATION Bilateral 04/23/2014   Procedure: LAPAROSCOPIC TUBAL LIGATION with Filshie Clips;  Surgeon: Harold Hedge, MD;  Location: [facility];  Service: Gynecology;   Laterality: Bilateral;    Home Medications:  Allergies as of 08/19/2017      Reactions   Latex Itching, Rash   sensitivity      Medication List        Accurate as of 08/19/17 10:22 AM. Always use your most recent med list.          clonazePAM 0.5 MG tablet Commonly known as:  KLONOPIN Take 0.5 mg by mouth 2 (two) times daily as needed for anxiety.   lamoTRIgine 200 MG tablet Commonly known as:  LAMICTAL   medroxyPROGESTERone 5 MG tablet Commonly known as:  PROVERA medroxyprogesterone 5 mg tablet   MULTIVITAMIN PO Take 1 tablet by mouth daily.   PREMARIN 0.625 MG tablet Generic drug:  estrogens (conjugated) Premarin 0.625 mg tablet   primidone 50 MG tablet Commonly known as:  MYSOLINE Take 100 mg by mouth 2 (two) times daily.   REXULTI 1 MG Tabs Generic drug:  Brexpiprazole   sertraline 100 MG tablet Commonly known as:  ZOLOFT Take 200 mg by mouth daily.   traZODone 100 MG tablet Commonly known as:  DESYREL   venlafaxine XR 150 MG 24 hr capsule Commonly known as:  EFFEXOR-XR   Vitamin D3 2000 units Tabs Take 2,000 Units by mouth daily.   vitamin E 1000 UNIT capsule  Allergies:  Allergies  Allergen Reactions  . Latex Itching and Rash    sensitivity    Family History: Family History  Problem Relation Age of Onset  . Depression Mother        bipolar disorder  . Arthritis Mother        osteoprosis  . Heart disease Father   . Heart attack Father [phone]  . Heart disease Paternal Grandmother   . Cancer Paternal Grandmother        breast cancer  . Colon cancer Neg Hx     Social History:  reports that she has never smoked. She has never used smokeless tobacco. She reports that she drinks alcohol. She reports that she does not use drugs.  ROS: UROLOGY Frequent Urination?: Yes Hard to postpone urination?: Yes Burning/pain with urination?: No Get up at night to urinate?: No Leakage of urine?: Yes Urine stream starts and stops?: No Trouble  starting stream?: No Do you have to strain to urinate?: No Blood in urine?: No Urinary tract infection?: No Sexually transmitted disease?: No Injury to kidneys or bladder?: No Painful intercourse?: No Weak stream?: No Currently pregnant?: No Vaginal bleeding?: No Last menstrual period?: n  Gastrointestinal Nausea?: No Vomiting?: No Indigestion/heartburn?: No Diarrhea?: No Constipation?: No  Constitutional Fever: No Night sweats?: No Weight loss?: No Fatigue?: No  Skin Skin rash/lesions?: No Itching?: No  Eyes Blurred vision?: Yes Double vision?: No  Ears/Nose/Throat Sore throat?: No Sinus problems?: No  Hematologic/Lymphatic Swollen glands?: No Easy bruising?: No  Cardiovascular Leg swelling?: No Chest pain?: No  Respiratory Cough?: No Shortness of breath?: No  Endocrine Excessive thirst?: No  Musculoskeletal Back pain?: No Joint pain?: No  Neurological Headaches?: No Dizziness?: No  Psychologic Depression?: Yes Anxiety?: Yes  Physical Exam: BP 122/77   Pulse 86   Ht 5\' 9"  (1.753 m)   Wt 185 lb (83.9 kg)   BMI 27.32 kg/m   Constitutional: Well nourished. Alert and oriented, No acute distress. HEENT: [place] AT, moist mucus membranes. Trachea midline, no masses. Cardiovascular: No clubbing, cyanosis, or edema. Respiratory: Normal respiratory effort, no increased work of breathing. GI: Abdomen is soft, non tender, non distended, no abdominal masses. Liver and spleen not palpable.  No hernias appreciated.  Stool sample for occult testing is not indicated.   GU: No CVA tenderness.  No bladder fullness or masses.  Normal external genitalia, normal pubic hair distribution, no lesions.  Normal urethral meatus, no lesions, no prolapse, no discharge.   No urethral masses, tenderness and/or tenderness. No bladder fullness, tenderness or masses. Normal vagina mucosa, good estrogen effect, no discharge, no lesions, good pelvic support, grade II cystocele,  and no rectocele noted.  No cervical motion tenderness.  Uterus is freely mobile and non-fixed.  No adnexal/parametria masses or tenderness noted.  Anus and perineum are without rashes or lesions.    Skin: No rashes, bruises or suspicious lesions. Lymph: No cervical or inguinal adenopathy. Neurologic: Grossly intact, no focal deficits, moving all 4 extremities. Psychiatric: Normal mood and affect.  Laboratory Data: Lab Results  Component Value Date   WBC 6.3 06/27/2017   HGB 12.7 06/27/2017   HCT 37.2 06/27/2017   MCV 94.2 06/27/2017   PLT 328 06/27/2017    Lab Results  Component Value Date   CREATININE 0.73 06/27/2017    No results found for: PSA  No results found for: TESTOSTERONE  Lab Results  Component Value Date   HGBA1C 4.9 06/27/2017    Lab Results  Component Value Date   TSH 0.62 06/27/2017       Component Value Date/Time   CHOL 174 06/27/2017 0000   HDL 67 06/27/2017 0000   CHOLHDL 2.6 06/27/2017 0000   VLDL 34 07/28/2015 0757   LDLCALC 84 06/27/2017 0000    Lab Results  Component Value Date   AST 13 06/27/2017   Lab Results  Component Value Date   ALT 12 06/27/2017   No components found for: ALKALINEPHOPHATASE No components found for: BILIRUBINTOTAL  No results found for: ESTRADIOL  Urinalysis See HPI and Epic.  I have reviewed the labs.   Pertinent Imaging: Results for MADICYN, MESINA (MRN [id number]) as of 08/25/2017 14:47  Ref. Range 08/19/2017 10:14  Scan Result Unknown 0     Assessment & Plan:    1. Frequency/urgency Discussed behavioral therapies, bladder training and bladder control strategies Pelvic floor muscle training - patient would like a referral  Offered medical therapy with anticholinergic therapy or beta-3 adrenergic receptor agonist and the potential side effects of each therapy - would like to try the beta-3 adrenergic receptor agonist (Myrbetriq).  Given Myrbetriq 25 mg samples, #28.   RTC in 3 weeks for PVR and  symptom recheck   2. Cystocele Will refer to PT for pelvic floor strengthening    Return in about 3 weeks (around 09/09/2017) for PVR and OAB questionnaire.  These notes generated with voice recognition software. I apologize for typographical errors.  Michiel Cowboy, PA-C  [facility] [address] [place], [place] [address] [phone]

## 2017-08-19 ENCOUNTER — Ambulatory Visit (INDEPENDENT_AMBULATORY_CARE_PROVIDER_SITE_OTHER): Payer: 59 | Admitting: Urology

## 2017-08-19 ENCOUNTER — Other Ambulatory Visit: Payer: Self-pay

## 2017-08-19 ENCOUNTER — Encounter: Payer: Self-pay | Admitting: Urology

## 2017-08-19 VITALS — BP 122/77 | HR 86 | Ht 69.0 in | Wt 185.0 lb

## 2017-08-19 DIAGNOSIS — N8111 Cystocele, midline: Secondary | ICD-10-CM | POA: Diagnosis not present

## 2017-08-19 DIAGNOSIS — R350 Frequency of micturition: Secondary | ICD-10-CM

## 2017-08-19 LAB — URINALYSIS, COMPLETE
BILIRUBIN UA: NEGATIVE
Glucose, UA: NEGATIVE
Ketones, UA: NEGATIVE
Leukocytes, UA: NEGATIVE
Nitrite, UA: NEGATIVE
PH UA: 7.5 (ref 5.0–7.5)
Protein, UA: NEGATIVE
Specific Gravity, UA: 1.015 (ref 1.005–1.030)
UUROB: 0.2 mg/dL (ref 0.2–1.0)

## 2017-08-19 LAB — MICROSCOPIC EXAMINATION: WBC, UA: NONE SEEN /hpf (ref 0–5)

## 2017-08-19 LAB — BLADDER SCAN AMB NON-IMAGING: SCAN RESULT: 0

## 2017-09-02 DIAGNOSIS — F322 Major depressive disorder, single episode, severe without psychotic features: Secondary | ICD-10-CM | POA: Diagnosis not present

## 2017-09-05 DIAGNOSIS — H43813 Vitreous degeneration, bilateral: Secondary | ICD-10-CM | POA: Diagnosis not present

## 2017-09-11 ENCOUNTER — Encounter: Payer: Self-pay | Admitting: Urology

## 2017-09-11 ENCOUNTER — Ambulatory Visit (INDEPENDENT_AMBULATORY_CARE_PROVIDER_SITE_OTHER): Payer: 59 | Admitting: Urology

## 2017-09-11 VITALS — BP 130/82 | HR 97 | Ht 69.0 in | Wt 185.0 lb

## 2017-09-11 DIAGNOSIS — R350 Frequency of micturition: Secondary | ICD-10-CM | POA: Diagnosis not present

## 2017-09-11 DIAGNOSIS — N8111 Cystocele, midline: Secondary | ICD-10-CM | POA: Diagnosis not present

## 2017-09-11 LAB — BLADDER SCAN AMB NON-IMAGING: SCAN RESULT: 0

## 2017-09-11 MED ORDER — MIRABEGRON ER 25 MG PO TB24: 25.0000 mg | ORAL_TABLET | Freq: Every day | ORAL | 3 refills | Status: DC

## 2017-09-11 NOTE — Progress Notes (Signed)
09/11/2017 9:53 AM   Jonda L Steve [birth year] [id number]  Referring provider: Smitty Cords, DO [address] [place], [place] [address]  Chief Complaint  Patient presents with  . Over Active Bladder    HPI: Patient is a 42 year old Caucasian female who presents today for a 3-week follow-up after trial of Myrbetriq 25 mg daily.  Background history Patient is a 69 -year-old Caucasian female who presents today for the complaint of urinary frequency.  She is having associated strong urgency and urge and stress incontinence.  Patient denies any gross hematuria, dysuria or suprapubic/flank pain.  Patient denies any fevers, chills, nausea or vomiting.  This has been worsening over the last several months.  She has not noted anything that makes her urinary symptoms worse or better.  She does not have a history of urinary tract infections, STI's or injury to the bladder.   She does not have a history of nephrolithiasis, GU surgery or GU trauma.  She admits to constipation.  She is drinking 3 to 4 thirty ounce bottles of water daily.   She drinks one to two cups of coffee daily.  Occasional tea.  No sodas.  No juices.  She has an occasional beer with meals.   Her PVR is 0 mL.  Her UA is negative for infection.    At today's visit (09/11/2017), the patient has been experiencing urgency x 0-3, frequency x 4-7, not restricting fluids to avoid visits to the restroom, not engaging in toilet mapping, incontinence x 0-3 and nocturia x 0-3.   Her PVR is 0 mL.   Her BP is 130/82.   Patient denies any gross hematuria, dysuria or suprapubic/flank pain.  Patient denies any fevers, chills, nausea or vomiting.   She has felt that the Myrbetriq has been very helpful and reaching her goals.  She has an appointment with PT at the end of the month.      PMH: Past Medical History:  Diagnosis Date  . Anemia    a. low-normal H/H  . Anxiety   . Arthritis    osteopenia  . Benign essential tremor    a.  bilateral upper and lower extremities; b. followed by [facility]; c. felt to be 2/2 antidepressants   . Carpal tunnel syndrome   . Deafness in left ear   . Palpitations     Surgical History: Past Surgical History:  Procedure Laterality Date  . axillary lymph node removal Right 1982   enlarged lymph node  . IUD REMOVAL N/A 04/23/2014   Procedure: INTRAUTERINE DEVICE (IUD) REMOVAL;  Surgeon: Harold Hedge, MD;  Location: [facility];  Service: Gynecology;  Laterality: N/A;  . LAPAROSCOPIC TUBAL LIGATION Bilateral 04/23/2014   Procedure: LAPAROSCOPIC TUBAL LIGATION with Filshie Clips;  Surgeon: Harold Hedge, MD;  Location: [facility];  Service: Gynecology;  Laterality: Bilateral;    Home Medications:  Allergies as of 09/11/2017      Reactions   Latex Itching, Rash   sensitivity      Medication List        Accurate as of 09/11/17  9:53 AM. Always use your most recent med list.          clonazePAM 0.5 MG tablet Commonly known as:  KLONOPIN Take 0.5 mg by mouth 2 (two) times daily as needed for anxiety.   lamoTRIgine 200 MG tablet Commonly known as:  LAMICTAL   mirabegron ER 25 MG Tb24 tablet Commonly known as:  MYRBETRIQ Take 1 tablet (25 mg  total) by mouth daily.   MULTIVITAMIN PO Take 1 tablet by mouth daily.   primidone 50 MG tablet Commonly known as:  MYSOLINE Take 100 mg by mouth 2 (two) times daily.   REXULTI 1 MG Tabs Generic drug:  Brexpiprazole   sertraline 100 MG tablet Commonly known as:  ZOLOFT Take 200 mg by mouth daily.   traZODone 100 MG tablet Commonly known as:  DESYREL   venlafaxine XR 150 MG 24 hr capsule Commonly known as:  EFFEXOR-XR   Vitamin D3 2000 units Tabs Take 2,000 Units by mouth daily.   vitamin E 1000 UNIT capsule       Allergies:  Allergies  Allergen Reactions  . Latex Itching and Rash    sensitivity    Family History: Family History  Problem Relation Age of Onset  . Depression Mother        bipolar disorder  . Arthritis Mother         osteoprosis  . Heart disease Father   . Heart attack Father 33  . Heart disease Paternal Grandmother   . Cancer Paternal Grandmother        breast cancer  . Colon cancer Neg Hx   . Bladder Cancer Neg Hx   . Kidney cancer Neg Hx     Social History:  reports that she has never smoked. She has never used smokeless tobacco. She reports that she drinks alcohol. She reports that she does not use drugs.  ROS: UROLOGY Frequent Urination?: No Hard to postpone urination?: No Burning/pain with urination?: No Get up at night to urinate?: No Leakage of urine?: No Urine stream starts and stops?: No Trouble starting stream?: No Do you have to strain to urinate?: No Blood in urine?: No Urinary tract infection?: No Sexually transmitted disease?: No Injury to kidneys or bladder?: No Painful intercourse?: No Weak stream?: No Currently pregnant?: No Vaginal bleeding?: No Last menstrual period?: n  Gastrointestinal Nausea?: No Vomiting?: No Indigestion/heartburn?: No Diarrhea?: No Constipation?: Yes  Constitutional Fever: No Night sweats?: No Weight loss?: No Fatigue?: No  Skin Skin rash/lesions?: No Itching?: No  Eyes Blurred vision?: No Double vision?: No  Ears/Nose/Throat Sore throat?: No Sinus problems?: No  Hematologic/Lymphatic Swollen glands?: No Easy bruising?: No  Cardiovascular Leg swelling?: No Chest pain?: No  Respiratory Cough?: No Shortness of breath?: No  Endocrine Excessive thirst?: No  Musculoskeletal Back pain?: No Joint pain?: No  Neurological Headaches?: No Dizziness?: No  Psychologic Depression?: Yes Anxiety?: Yes  Physical Exam: BP 130/82 (BP Location: Left Arm, Patient Position: Sitting, Cuff Size: Normal)   Pulse 97   Ht 5\' 9"  (1.753 m)   Wt 185 lb (83.9 kg)   BMI 27.32 kg/m   Constitutional: Well nourished. Alert and oriented, No acute distress. HEENT: [place] AT, moist mucus membranes. Trachea midline, no  masses. Cardiovascular: No clubbing, cyanosis, or edema. Respiratory: Normal respiratory effort, no increased work of breathing. Skin: No rashes, bruises or suspicious lesions. Lymph: No cervical or inguinal adenopathy. Neurologic: Grossly intact, no focal deficits, moving all 4 extremities. Psychiatric: Normal mood and affect.  Laboratory Data: Lab Results  Component Value Date   WBC 6.3 06/27/2017   HGB 12.7 06/27/2017   HCT 37.2 06/27/2017   MCV 94.2 06/27/2017   PLT 328 06/27/2017    Lab Results  Component Value Date   CREATININE 0.73 06/27/2017    No results found for: PSA  No results found for: TESTOSTERONE  Lab Results  Component Value Date  HGBA1C 4.9 06/27/2017    Lab Results  Component Value Date   TSH 0.62 06/27/2017       Component Value Date/Time   CHOL 174 06/27/2017 0000   HDL 67 06/27/2017 0000   CHOLHDL 2.6 06/27/2017 0000   VLDL 34 07/28/2015 0757   LDLCALC 84 06/27/2017 0000    Lab Results  Component Value Date   AST 13 06/27/2017   Lab Results  Component Value Date   ALT 12 06/27/2017   No components found for: ALKALINEPHOPHATASE No components found for: BILIRUBINTOTAL  No results found for: ESTRADIOL  I have reviewed the labs.   Pertinent Imaging: Results for BRENTON, ARBAIZA (MRN [id number]) as of 09/11/2017 09:42  Ref. Range 09/11/2017 09:32  Scan Result Unknown 0    Assessment & Plan:    1. Frequency/urgency Continue Myrbetriq 25 mg daily Return to clinic in 1 year for OAB questionnaire and PVR  2. Cystocele Is being seen by physical therapy Follow-up in 1 year  Return in about 1 year (around 09/12/2018) for PVR and OAB questionnaire.  These notes generated with voice recognition software. I apologize for typographical errors.  Michiel Cowboy, PA-C  [facility] [address] [place], [place] [address] [phone]

## 2017-09-18 DIAGNOSIS — F322 Major depressive disorder, single episode, severe without psychotic features: Secondary | ICD-10-CM | POA: Diagnosis not present

## 2017-10-01 ENCOUNTER — Encounter: Payer: Self-pay | Admitting: Physical Therapy

## 2017-10-01 ENCOUNTER — Other Ambulatory Visit: Payer: Self-pay

## 2017-10-01 ENCOUNTER — Ambulatory Visit: Payer: 59 | Attending: Urology | Admitting: Physical Therapy

## 2017-10-01 ENCOUNTER — Telehealth: Payer: Self-pay

## 2017-10-01 DIAGNOSIS — M25551 Pain in right hip: Secondary | ICD-10-CM | POA: Insufficient documentation

## 2017-10-01 DIAGNOSIS — M25611 Stiffness of right shoulder, not elsewhere classified: Secondary | ICD-10-CM | POA: Diagnosis not present

## 2017-10-01 DIAGNOSIS — R293 Abnormal posture: Secondary | ICD-10-CM | POA: Insufficient documentation

## 2017-10-01 DIAGNOSIS — M4125 Other idiopathic scoliosis, thoracolumbar region: Secondary | ICD-10-CM | POA: Diagnosis not present

## 2017-10-01 DIAGNOSIS — M25511 Pain in right shoulder: Secondary | ICD-10-CM | POA: Insufficient documentation

## 2017-10-01 DIAGNOSIS — M2170 Unequal limb length (acquired), unspecified site: Secondary | ICD-10-CM | POA: Insufficient documentation

## 2017-10-01 DIAGNOSIS — R278 Other lack of coordination: Secondary | ICD-10-CM | POA: Diagnosis not present

## 2017-10-01 NOTE — Patient Instructions (Signed)
Open book ( both ) sides   Wear shoe lift in R shoe \   Standing posture  Shift navel forward slightly, shift weight across heel and ball mound, unlock knees  Neutral spine and pelvic alignment (like zipping a zipper)    Laying on your back:  find pelvic neutral by bridging up first , then lower ribs and lastly, your pelvis   50% weight in both feet in ski track stance or with feet under hips

## 2017-10-01 NOTE — Telephone Encounter (Signed)
Pt reports that her rx for Myrbetriq needs a prior authorization. This was completed and sent using CMM. Pt informed in the meantime she could pickup samples.

## 2017-10-03 NOTE — Therapy (Signed)
[place] MAIN [facility] SERVICES [address] [place], [place], [address] Phone: [phone]   Fax:  [phone]  Physical Therapy Evaluation  Patient Details  Name: Carolyn [name] MRN: [id number] Date of Birth: 1975/07/04 Referring Provider: Zara Council   Encounter Date: 10/01/2017  PT End of Session - 10/03/17 0904    Visit Number  1    Number of Visits  12    Date for PT Re-Evaluation  12/24/17    PT Start Time  0904    PT Stop Time  1015    PT Time Calculation (min)  71 min       Past Medical History:  Diagnosis Date  . Anemia    a. low-normal H/H  . Anxiety   . Arthritis    osteopenia  . Benign essential tremor    a. bilateral upper and lower extremities; b. followed by [facility]; c. felt to be 2/2 antidepressants   . Carpal tunnel syndrome   . Deafness in left ear   . Palpitations     Past Surgical History:  Procedure Laterality Date  . axillary lymph node removal Right 1982   enlarged lymph node  . IUD REMOVAL N/A 04/23/2014   Procedure: INTRAUTERINE DEVICE (IUD) REMOVAL;  Surgeon: Everlene Farrier, MD;  Location: [place] [facility];  Service: Gynecology;  Laterality: N/A;  . LAPAROSCOPIC TUBAL LIGATION Bilateral 04/23/2014   Procedure: LAPAROSCOPIC TUBAL LIGATION with Filshie Clips;  Surgeon: Everlene Farrier, MD;  Location: [place] [facility];  Service: Gynecology;  Laterality: Bilateral;    There were no vitals filed for this visit.   Subjective Assessment - 10/03/17 0917    Subjective 1) Urinary issues:  a)Urinary frequency has gradually worsened over the past 6 months. Before taking Myrbetric, pt went to urinate once every 2 hours, and after taking th is medication, once every 3-4 hours.  Pt is completely emptying and having no difficulty with initiating urine.  No nocturia b) SUI with coughing , sneezing, laughing. Pt coughs frequently and has phlegm. Pt has been prescribed allergy pill to decrease phlegm but she has not done it.    2) Constipation  . Bowel movements occur 2 x week with straining. Stool Type 1 ( 75%) and 3 ( 25%). Drinks 120 fl oz of water. Lifelong habit of constipation and prefers to not use public restrooms. Pt has tearing frequently. Bleedy with BMs for the past few months but she has not f/u with doctors.      3) CLBP which was bad in March at 8/10 which came on gradually. Denied radiating pain. Located at [facility] ( pt pointed). Chiropractic Tx has helped her and she goes 2x month and massage therapist once a month.    4)   Difficulty with sleeping :  no problem falling asleep but will wake up after 2-3 hours and then difficulty falling back asleep. Pt takes Trazadone which has helped her to not wake up in the middle of the night.  Pt's husband tells her that she snores. Pt has not been tested for OSA.   Goes to bed 9-10pm and gets up at 6pm. Pt watches TV before sleeping.    5) Pinching sensation at the R  groin sometimes and she wonders if it has to do with the clips during her tubal ligation. This sensation started right after the procedure 2016.        Pertinent History  Perimenopausal, osteopenia, anxiety and depression are being managed with medication, Gyn Hx:  tubal ligation, episiotomy with first and only child, Physical activity: "very little". Pt prefers to exercise with other people. In the past, pt used to go to the gym ( elliptical for 5K).  Pt has not pursued weight lifting since college. Pt used to ride bicycle for 20 mi rides. Family used to also do kayaking.  Pt would like to start bicycle.     Patient Stated Goals  Address all issues above.  Return to 20 mi bike riding .  Wean off Myrbetriq, sleeping meds while working with her psychiatrist and doctors          [facility] PT Assessment - 10/03/17 0903      Assessment   Medical Diagnosis  urinary frequency/ cystocele     Referring Provider  Zara Council      Precautions   Precautions  None      Restrictions   Weight Bearing Restrictions  No      Prior  Function   Level of Independence  Independent      Coordination   Gross Motor Movements are Fluid and Coordinated  --   chest breathing   Fine Motor Movements are Fluid and Coordinated  --   abdominal overuse w/cue for pelvic floor contraction     Single Leg Stance   Comments  no deviations       AROM   Overall AROM   --   limited rotation ~30% L, ~40%R       Palpation   Spinal mobility  increased R thoracic tightness hypomobility L > R along lumbar as well     SI assessment   R iliac crest higher, L convex curve at lumbar.  93 cm on R, 94 cm on L leg length difference, supine : R ASIS lower than L                   Objective measurements completed on examination: See above findings.    Pelvic Floor Special Questions - 10/03/17 0904    Diastasis Recti  neg    External Perineal Exam  through clothing: no tenderness/ tensions along ischiocavernosus/ bulbospongiosus, coccygeus B        [facility] Adult PT Treatment/Exercise - 10/03/17 0914      Therapeutic Activites    Therapeutic Activities  --   see pt instructions     Manual Therapy   Manual therapy comments  STM along thoracic mm with MWM on R > L             PT Education - 10/03/17 0905    Education provided  Yes    Education Details  anatomy/physiology, HEP, goals, POC    Person(s) Educated  Patient    Methods  Explanation;Demonstration;Tactile cues;Verbal cues;Handout    Comprehension  Returned demonstration;Verbalized understanding;Verbal cues required;Tactile cues required       PT Short Term Goals - 11/28/16 0810      PT SHORT TERM GOAL #1   Title  Patient will be independent in home exercise program to improve strength/mobility for better functional independence with ADLs.    Baseline  11/21: independent    Time  2    Period  Weeks    Status  Achieved      PT SHORT TERM GOAL #2   Title  Patient will be able to perform household work/ chores without increase in symptoms.    Baseline  pain  occasionally increases to 2/10    Time  2  Period  Weeks    Status  Partially Met        PT Long Term Goals - 10/03/17 0907      PT LONG TERM GOAL #1   Title  Pt will decrease her NIH-CPSI score from 19% to < 9% in order to improve pelvic floor function    Time  12    Period  Weeks    Status  New    Target Date  12/26/17      PT LONG TERM GOAL #2   Title  Pt will decrease her COREFO score from 22% to < 12% in order to improve bowel function and have more regular bowel movements    Time  8    Period  Weeks    Status  New    Target Date  11/28/17      PT LONG TERM GOAL #3   Title  Pt will demo equal iliac crest alignment across 2 visits with compliance of shoe lift wear in order to progress to pelvic floor coordiantion and strengthening    Time  4    Period  Weeks    Status  New    Target Date  10/31/17      PT LONG TERM GOAL #4   Title  Pt will demo proper deep core coordination, proer pelvic floor coordination without abdominal overuse in order to improve intraabdominal pressure to minimize pelvic floor dysfunctions    Time  8    Period  Weeks    Status  New    Target Date  11/28/17      PT LONG TERM GOAL #5   Title  Pt will demo proper body mechanics at work, home, and fitness routine in order to minimzie worsening of prolapse     Time  12    Period  Weeks    Status  New    Target Date  12/26/17      Additional Long Term Goals   Additional Long Term Goals  Yes      PT LONG TERM GOAL #6   Title  Pt will decrease R thoracic mm tensions and increase diaphragmatic excursion in order to improve spinal deviations 2/2 scoliosis    Time  4    Period  Weeks    Status  New    Target Date  10/31/17      PT LONG TERM GOAL #7   Title  Pt will decrease her PSQI score from % to < % and be referred to a sleep study to screen for OSA in order to improve sleep quality for overall wellness     Time  8    Period  Weeks    Status  New    Target Date  11/28/17      PT LONG  TERM GOAL #8   Title  Pt will report no "pinching" sensation at her groin occuring across 1 month in order to improve QOL    Time  10    Period  Weeks    Status  New    Target Date  12/12/17      PT LONG TERM GOAL  #9   TITLE  Pt will report no SIJ issues across 1 month with compliance of scolios-specific HEP in order to better manage her scoliosis    Time  4    Period  Weeks    Status  New    Target Date  10/31/17  PT LONG TERM GOAL  #10   TITLE  Pt will report improvemetn of her stool consistency from Type 1 to Type 4 across 75% of the time) in order to improve GI function and to minimize straining for better positioning of her bladder    Time  12    Period  Weeks    Status  New    Target Date  12/26/17             Plan - 10/03/17 0905    Clinical Impression Statement Pt is 42 yo female who reports urinary issues with urinary frequency and SUI, constipation, CLBP, "pinching sensation" at her R groin, and difficulty sleeping. These deficits impact her QOL. Pt's clinical presentations include scoliosis with higher L iliac crest, leg length difference, dyscoordination of pelvic floor mm and deep core, increased thoracic hypomobility, limited spinal rotation to L. Pt was provided education on sleep hygiene, manual Tx to decrease thoracic mm tensions,and  provided a shoe lift in R shoe. Pt tolerated Tx without complaints. Discussed proper standing posture and to avoid standing with uneven weight-bearing on one leg.    Pt will benefit from skilled PT with an interdependent regional approach to yield long-term benefitsbecause her scoliosis and leg length difference  plays a significant role on her pelvic floor issues. Pt has has PT for shoulder issues and chiropractic Tx for her CLBP but has not had her scoliosis and leg length addressed with these prior Tx.   . Plan to assess pelvic floor at next session.       History and Personal Factors relevant to plan of care:    Perimenopausal, osteopenia, anxiety and depression are being managed with medication, Gyn Hx: tubal ligation, episiotomy with first and only child, Physical activity: "very little".     Clinical Presentation  Evolving    Clinical Decision Making  Moderate    Rehab Potential  Good    PT Frequency  1x / week    PT Duration  12 weeks    PT Treatment/Interventions  ADLs/Self Care Home Management;Electrical Stimulation;Cryotherapy;Ultrasound;Traction;Moist Heat;Iontophoresis 67m/ml Dexamethasone;Functional mobility training;Therapeutic activities;Therapeutic exercise;Patient/family education;Neuromuscular re-education;Manual techniques;Scar mobilization;Passive range of motion;Visual/perceptual remediation/compensation;Taping;Splinting;Energy conservation;Dry needling;Biofeedback;Aquatic Therapy;Manual lymph drainage;Balance training;Stair training    Consulted and Agree with Plan of Care  Patient       Patient will benefit from skilled therapeutic intervention in order to improve the following deficits and impairments:  Abnormal gait, Decreased activity tolerance, Decreased coordination, Decreased range of motion, Decreased strength, Hypomobility, Impaired flexibility, Impaired perceived functional ability, Impaired UE functional use, Postural dysfunction, Improper body mechanics, Pain, Decreased mobility, Decreased scar mobility, Increased muscle spasms  Visit Diagnosis: Other lack of coordination  Other idiopathic scoliosis, thoracolumbar region  Unequal leg length     Problem List Patient Active Problem List   Diagnosis Date Noted  . Hypertriglyceridemia 05/17/2017  . Perimenopausal 02/13/2016  . 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 Numbness of right foot 11/29/2015  . Elevated hemoglobin A1c 11/29/2015  . Osteopenia 11/29/2015  . Vitamin D insufficiency 11/29/2015  . Palpitations   . Recurrent major depression in full remission ([facility]   . Deafness in left  ear   . Anxiety 04/08/2013  . Benign essential tremor 04/08/2013  . Carpal tunnel syndrome 04/08/2013  . Depression 04/08/2013    YJerl Mina,PT, DPT, E-RYT  10/03/2017, 9:22 AM  [place] [facility] [address] [address]Phone: [phone]  Fax:  [phone]  Name: Carolyn [name] MRN: [id number] Date of Birth: [birth year]

## 2017-10-07 ENCOUNTER — Ambulatory Visit: Payer: 59 | Admitting: Physical Therapy

## 2017-10-07 ENCOUNTER — Telehealth: Payer: Self-pay | Admitting: Urology

## 2017-10-07 DIAGNOSIS — M25511 Pain in right shoulder: Secondary | ICD-10-CM | POA: Diagnosis not present

## 2017-10-07 DIAGNOSIS — R278 Other lack of coordination: Secondary | ICD-10-CM

## 2017-10-07 DIAGNOSIS — M4125 Other idiopathic scoliosis, thoracolumbar region: Secondary | ICD-10-CM

## 2017-10-07 DIAGNOSIS — M2170 Unequal limb length (acquired), unspecified site: Secondary | ICD-10-CM

## 2017-10-07 DIAGNOSIS — R293 Abnormal posture: Secondary | ICD-10-CM | POA: Diagnosis not present

## 2017-10-07 DIAGNOSIS — M25611 Stiffness of right shoulder, not elsewhere classified: Secondary | ICD-10-CM | POA: Diagnosis not present

## 2017-10-07 DIAGNOSIS — M25551 Pain in right hip: Secondary | ICD-10-CM | POA: Diagnosis not present

## 2017-10-07 NOTE — Telephone Encounter (Signed)
[facility] called with clinical questions about a pre-authorization that was sent over concerning Myrbetriq.  [phone]  REF# [id number]

## 2017-10-07 NOTE — Patient Instructions (Signed)
Practice proper pelvic floor coordination  Inhale: expand pelvic floor muscles Exhale" "j" scoop, allow pelvic floor to close, lift first before belly sinks   ( not "draw abdominal muscle to spine" or strain with abdominal muscles")    ______  Morning and Night: Pillow under hips :   Deep core level 1 with quick squeeze after exhale  10 reps    Deep core level 2 ( 6 min)   See handout  ________  Relaxation practice before bed No TV 2 hours prior On bed:  legs up the wall or head board  practicing body scan ( emailed audio)  10 min

## 2017-10-07 NOTE — Telephone Encounter (Signed)
I called and spoke with Crystal at [employer], she wanted to know if pt had tried Oxybutynin before. Per pt she has not tried this medication. Waiting for decision to be faxed over.

## 2017-10-07 NOTE — Telephone Encounter (Signed)
Can you review this please

## 2017-10-07 NOTE — Therapy (Addendum)
[place] MAIN [facility] SERVICES [address] [place], [place], [address] Phone: [phone]   Fax:  [phone]  Physical Therapy Treatment  Patient Details  Name: Carolyn [name] MRN: [id number] Date of Birth: 09-26-1975 Referring Provider (PT): Zara Council   Encounter Date: 10/07/2017  PT End of Session - 10/07/17 0814    Visit Number  2    Number of Visits  12    Date for PT Re-Evaluation  12/24/17    PT Start Time  0813    PT Stop Time  0904    PT Time Calculation (min)  51 min       Past Medical History:  Diagnosis Date  . Anemia    a. low-normal H/H  . Anxiety   . Arthritis    osteopenia  . Benign essential tremor    a. bilateral upper and lower extremities; b. followed by [facility]; c. felt to be 2/2 antidepressants   . Carpal tunnel syndrome   . Deafness in left ear   . Palpitations     Past Surgical History:  Procedure Laterality Date  . axillary lymph node removal Right 1982   enlarged lymph node  . IUD REMOVAL N/A 04/23/2014   Procedure: INTRAUTERINE DEVICE (IUD) REMOVAL;  Surgeon: Everlene Farrier, MD;  Location: [facility];  Service: Gynecology;  Laterality: N/A;  . LAPAROSCOPIC TUBAL LIGATION Bilateral 04/23/2014   Procedure: LAPAROSCOPIC TUBAL LIGATION with Filshie Clips;  Surgeon: Everlene Farrier, MD;  Location: [facility];  Service: Gynecology;  Laterality: Bilateral;    There were no vitals filed for this visit.  Subjective Assessment - 10/07/17 0814    Subjective  Pt has been catching herself when she is standing with more weight on  one leg. LBP 2-4/10.     Pertinent History  Perimenopausal, osteopenia, anxiety and depression are being managed with medication, Gyn Hx: tubal ligation, episiotomy with first and only child, Physical activity: "very little". Pt prefers to exercise with other people. In the past, pt used to go to the gym ( elliptical for 5K).  Pt has not pursued weight lifting since college. Pt used to ride bicycle  for 20 mi rides. Family used to also do kayaking.  Pt would like to start bicycle.     Patient Stated Goals  Address all issues above.  Return to 20 mi bike riding .  Wean off Myrbetriq, sleeping meds while working with her psychiatrist and doctors          [facility] PT Assessment - 10/07/17 0848      Palpation   SI assessment   iliac crest levelled standing, supine                Pelvic Floor Special Questions - 10/07/17 0849    External Palpation  fascial restrictions R suprapubic area     Prolapse  Anterior Wall   distal to pubic symphysis , within introitus    Pelvic Floor Internal Exam  pt consented verbally without contraindications    Exam Type  Vaginal    Palpation  R anterior mm delayed pre Tx.  improved cranail movement of R anterior post Tx    Strength  fair squeeze, definite lift    Strength # of reps  10    Strength # of seconds  1        [facility] Adult PT Treatment/Exercise - 10/07/17 0850      Neuro Re-ed    Neuro Re-ed Details  see pt instructions       Manual Therapy   Manual therapy comments  fascial releases over R suprapubic area with MWM of R LE    Internal Pelvic Floor  facilitation of R anterior pelvic floor with external technique over suprapubic/ mons pubis                PT Short Term Goals - 11/28/16 0810      PT SHORT TERM GOAL #1   Title  Patient will be independent in home exercise program to improve strength/mobility for better functional independence with ADLs.    Baseline  11/21: independent    Time  2    Period  Weeks    Status  Achieved      PT SHORT TERM GOAL #2   Title  Patient will be able to perform household work/ chores without increase in symptoms.    Baseline  pain occasionally increases to 2/10    Time  2    Period  Weeks    Status  Partially Met        PT Long Term Goals - 10/03/17 0907      PT LONG TERM GOAL #1   Title  Pt will decrease her NIH-CPSI score from 19% to < 9% in order to improve pelvic  floor function    Time  12    Period  Weeks    Status  New    Target Date  12/26/17      PT LONG TERM GOAL #2   Title  Pt will decrease her COREFO score from 22% to < 12% in order to improve bowel function and have more regular bowel movements    Time  8    Period  Weeks    Status  New    Target Date  11/28/17      PT LONG TERM GOAL #3   Title  Pt will demo equal iliac crest alignment across 2 visits with compliance of shoe lift wear in order to progress to pelvic floor coordiantion and strengthening    Time  4    Period  Weeks    Status  New    Target Date  10/31/17      PT LONG TERM GOAL #4   Title  Pt will demo proper deep core coordination, proer pelvic floor coordination without abdominal overuse in order to improve intraabdominal pressure to minimize pelvic floor dysfunctions    Time  8    Period  Weeks    Status  New    Target Date  11/28/17      PT LONG TERM GOAL #5   Title  Pt will demo proper body mechanics at work, home, and fitness routine in order to minimzie worsening of prolapse     Time  12    Period  Weeks    Status  New    Target Date  12/26/17      Additional Long Term Goals   Additional Long Term Goals  Yes      PT LONG TERM GOAL #6   Title  Pt will decrease R thoracic mm tensions and increase diaphragmatic excursion in order to improve spinal deviations 2/2 scoliosis    Time  4    Period  Weeks    Status  New    Target Date  10/31/17      PT LONG TERM GOAL #7   Title  Pt will decrease her PSQI score from %  to < % and be referred to a sleep study to screen for OSA in order to improve sleep quality for overall wellness     Time  8    Period  Weeks    Status  New    Target Date  11/28/17      PT LONG TERM GOAL #8   Title  Pt will report no "pinching" sensation at her groin occuring across 1 month in order to improve QOL    Time  10    Period  Weeks    Status  New    Target Date  12/12/17      PT LONG TERM GOAL  #9   TITLE  Pt will report no  SIJ issues across 1 month with compliance of scolios-specific HEP in order to better manage her scoliosis    Time  4    Period  Weeks    Status  New    Target Date  10/31/17      PT LONG TERM GOAL  #10   TITLE  Pt will report improvemetn of her stool consistency from Type 1 to Type 4 across 75% of the time) in order to improve GI function and to minimize straining for better positioning of her bladder    Time  12    Period  Weeks    Status  New    Target Date  12/26/17            Plan - 10/07/17 0858    Clinical Impression Statement  Pt demo'd decreased activity of R anterior pelvic floor mm which is likely related to R shoter leg length and R fascial restrictions over R suprapubic area. Manual Tx was applied to facilitate fascial mobility over R suprapubic area and R anterior pelvic floor with use of internal and external tehcniques. Pt had no complaints with Tx today. Pt demo'd increased cranial movement of pelvic floor and bladder post Tx. Progressed to quick pelvic squeezes with deep core level 1 and also added deep core level 2 in gravity eliminated position ( pillow under hips). Educated pt to withhold from the exercises she learned from her chiropractor. Plan to treat her low back which is currently at a 2-4/10 over the past week. Pt continues to benefit from skilled PT.      Rehab Potential  Good    PT Frequency  1x / week    PT Duration  12 weeks    PT Treatment/Interventions  ADLs/Self Care Home Management;Electrical Stimulation;Cryotherapy;Ultrasound;Traction;Moist Heat;Iontophoresis 19m/ml Dexamethasone;Functional mobility training;Therapeutic activities;Therapeutic exercise;Patient/family education;Neuromuscular re-education;Manual techniques;Scar mobilization;Passive range of motion;Visual/perceptual remediation/compensation;Taping;Splinting;Energy conservation;Dry needling;Biofeedback;Aquatic Therapy;Manual lymph drainage;Balance training;Stair training    Consulted and Agree  with Plan of Care  Patient       Patient will benefit from skilled therapeutic intervention in order to improve the following deficits and impairments:  Abnormal gait, Decreased activity tolerance, Decreased coordination, Decreased range of motion, Decreased strength, Hypomobility, Impaired flexibility, Impaired perceived functional ability, Impaired UE functional use, Postural dysfunction, Improper body mechanics, Pain, Decreased mobility, Decreased scar mobility, Increased muscle spasms  Visit Diagnosis: Other idiopathic scoliosis, thoracolumbar region  Unequal leg length  Other lack of coordination     Problem List Patient Active Problem List   Diagnosis Date Noted  . Hypertriglyceridemia 05/17/2017  . Perimenopausal 02/13/2016  . 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 Numbness of right foot 11/29/2015  . Elevated hemoglobin A1c 11/29/2015  . Osteopenia 11/29/2015  . Vitamin D insufficiency  11/29/2015  . Palpitations   . Recurrent major depression in full remission ([place])   . Deafness in left ear   . Anxiety 04/08/2013  . Benign essential tremor 04/08/2013  . Carpal tunnel syndrome 04/08/2013  . Depression 04/08/2013    Jerl Mina ,PT, DPT, E-RYT  10/07/2017, 9:10 AM  [place] MAIN [facility] SERVICES [address] [place], [place], [address] Phone: [phone]   Fax:  [phone]  Name: ANNYA LIZANA MRN: [id number] Date of Birth: [birth year]

## 2017-10-08 DIAGNOSIS — F322 Major depressive disorder, single episode, severe without psychotic features: Secondary | ICD-10-CM | POA: Diagnosis not present

## 2017-10-14 ENCOUNTER — Ambulatory Visit: Payer: 59 | Attending: Urology | Admitting: Physical Therapy

## 2017-10-14 DIAGNOSIS — F332 Major depressive disorder, recurrent severe without psychotic features: Secondary | ICD-10-CM | POA: Diagnosis not present

## 2017-10-14 DIAGNOSIS — R293 Abnormal posture: Secondary | ICD-10-CM | POA: Diagnosis not present

## 2017-10-14 DIAGNOSIS — M25511 Pain in right shoulder: Secondary | ICD-10-CM | POA: Insufficient documentation

## 2017-10-14 DIAGNOSIS — R278 Other lack of coordination: Secondary | ICD-10-CM | POA: Insufficient documentation

## 2017-10-14 DIAGNOSIS — M4125 Other idiopathic scoliosis, thoracolumbar region: Secondary | ICD-10-CM | POA: Insufficient documentation

## 2017-10-14 DIAGNOSIS — M2170 Unequal limb length (acquired), unspecified site: Secondary | ICD-10-CM | POA: Diagnosis not present

## 2017-10-14 DIAGNOSIS — M25611 Stiffness of right shoulder, not elsewhere classified: Secondary | ICD-10-CM | POA: Diagnosis not present

## 2017-10-14 DIAGNOSIS — F900 Attention-deficit hyperactivity disorder, predominantly inattentive type: Secondary | ICD-10-CM | POA: Diagnosis not present

## 2017-10-14 DIAGNOSIS — F410 Panic disorder [episodic paroxysmal anxiety] without agoraphobia: Secondary | ICD-10-CM | POA: Diagnosis not present

## 2017-10-14 NOTE — Patient Instructions (Addendum)
Prone Heel Press for strengthening sacro-iliac joints  1. Lie on your belly. If you have an arch in your low back or it feels umcomfortable, place a pillow under your low belly/hips to make sure your low back feel comfortable.   2. Place our forehead on top of your palms.      Widen your knees apart for starting position.   3. Inhale, feel belly and low back expand  4. Exhale, feel belly hug in, press heel together and count aloud for 5 sec. Then relax the heel squeezing.  Perform 10 reps of 5 sec holds. 2 sets/ day.    If you feel entire buttock tighten too much or feel low back pain, apply 50% less effort. As you press your heel together, you will feel as if your pubic bone (front of your pelvis) and sacrum (back of your pelvis) gentle move towards each other or your low abdominal muscles hug in more. __________  childs pose rocking x 5  reps __________   Every 1-2 hours at work:   arm swings 10 reps L/R ,  out of chair, --> mini squat -> chest lifts with hands interlaced, elbows straight, pulling palms down     Modified childs pose at the table --> thread the needle   __________  Adjust work station to minimize tight midback and shoulders ( see handout)  Allow for exhale to relax ribs ,  shoulders down and back,

## 2017-10-14 NOTE — Therapy (Signed)
[place] MAIN [facility] SERVICES [address] [place], [place], [address] Phone: [phone]   Fax:  [phone]  Physical Therapy Treatment  Patient Details  Name: Carolyn [name] MRN: [id number] Date of Birth: 08/18/1975 Referring Provider (PT): Zara Council   Encounter Date: 10/14/2017  PT End of Session - 10/14/17 0811    Visit Number  3    Number of Visits  12    Date for PT Re-Evaluation  12/24/17    PT Start Time  0811    PT Stop Time  0900    PT Time Calculation (min)  49 min       Past Medical History:  Diagnosis Date  . Anemia    a. low-normal H/H  . Anxiety   . Arthritis    osteopenia  . Benign essential tremor    a. bilateral upper and lower extremities; b. followed by [facility]; c. felt to be 2/2 antidepressants   . Carpal tunnel syndrome   . Deafness in left ear   . Palpitations     Past Surgical History:  Procedure Laterality Date  . axillary lymph node removal Right 1982   enlarged lymph node  . IUD REMOVAL N/A 04/23/2014   Procedure: INTRAUTERINE DEVICE (IUD) REMOVAL;  Surgeon: Everlene Farrier, MD;  Location: [facility];  Service: Gynecology;  Laterality: N/A;  . LAPAROSCOPIC TUBAL LIGATION Bilateral 04/23/2014   Procedure: LAPAROSCOPIC TUBAL LIGATION with Filshie Clips;  Surgeon: Everlene Farrier, MD;  Location: [facility];  Service: Gynecology;  Laterality: Bilateral;    There were no vitals filed for this visit.  Subjective Assessment - 10/14/17 0812    Subjective  The shoe lift has aggravated the LBP .  She only notices it at a 3/10 at the most and it occurs once in a while.     Pertinent History  Perimenopausal, osteopenia, anxiety and depression are being managed with medication, Gyn Hx: tubal ligation, episiotomy with first and only child, Physical activity: "very little". Pt prefers to exercise with other people. In the past, pt used to go to the gym ( elliptical for 5K).  Pt has not pursued weight lifting since college. Pt  used to ride bicycle for 20 mi rides. Family used to also do kayaking.  Pt would like to start bicycle.     Patient Stated Goals  Address all issues above.  Return to 20 mi bike riding .  Wean off Myrbetriq, sleeping meds while working with her psychiatrist and doctors          [facility] PT Assessment - 10/14/17 0902      Palpation   Spinal mobility  hypomobility at throacic segments L/ R with interspinal/ paraspinal tensions     SI assessment   iliac crest levelled standing, supine                   [facility] Adult PT Treatment/Exercise - 10/14/17 0903      Neuro Re-ed    Neuro Re-ed Details   see pt instructions       Moist Heat Therapy   Number Minutes Moist Heat  5 Minutes    Moist Heat Location  --   throacic      Manual Therapy   Manual therapy comments  STM along thoracic mm indicated in assessments  and Grade III at throacic segments              PT Education - 10/14/17 0902    Education  provided  Yes    Education Details  HEP    Person(s) Educated  Patient    Methods  Explanation;Demonstration;Tactile cues;Verbal cues;Handout    Comprehension  Verbalized understanding       PT Short Term Goals - 11/28/16 0810      PT SHORT TERM GOAL #1   Title  Patient will be independent in home exercise program to improve strength/mobility for better functional independence with ADLs.    Baseline  11/21: independent    Time  2    Period  Weeks    Status  Achieved      PT SHORT TERM GOAL #2   Title  Patient will be able to perform household work/ chores without increase in symptoms.    Baseline  pain occasionally increases to 2/10    Time  2    Period  Weeks    Status  Partially Met        PT Long Term Goals - 10/07/17 1310      PT LONG TERM GOAL #1   Title  Pt will decrease her NIH-CPSI score from 19% to < 9% in order to improve pelvic floor function    Time  12    Period  Weeks    Status  On-going      PT LONG TERM GOAL #2   Title  Pt will  decrease her COREFO score from 22% to < 12% in order to improve bowel function and have more regular bowel movements    Time  8    Period  Weeks    Status  On-going      PT LONG TERM GOAL #3   Title  Pt will demo equal iliac crest alignment across 2 visits with compliance of shoe lift wear in order to progress to pelvic floor coordiantion and strengthening    Time  4    Period  Weeks    Status  On-going      PT LONG TERM GOAL #4   Title  Pt will demo proper deep core coordination, proer pelvic floor coordination without abdominal overuse in order to improve intraabdominal pressure to minimize pelvic floor dysfunctions    Time  8    Period  Weeks    Status  On-going      PT LONG TERM GOAL #5   Title  Pt will demo proper body mechanics at work, home, and fitness routine in order to minimzie worsening of prolapse     Time  12    Period  Weeks    Status  On-going      PT LONG TERM GOAL #6   Title  Pt will decrease R thoracic mm tensions and increase diaphragmatic excursion in order to improve spinal deviations 2/2 scoliosis    Time  4    Period  Weeks    Status  On-going      PT LONG TERM GOAL #7   Title  Pt will decrease her PSQI score from 43% to <33 % and be referred to a sleep study to screen for OSA in order to improve sleep quality for overall wellness     Time  8    Period  Weeks    Status  On-going      PT LONG TERM GOAL #8   Title  Pt will report no "pinching" sensation at her groin occuring across 1 month in order to improve QOL    Time  10  Period  Weeks    Status  On-going      PT LONG TERM GOAL  #9   TITLE  Pt will report no SIJ issues across 1 month with compliance of scolios-specific HEP in order to better manage her scoliosis    Time  4    Period  Weeks    Status  On-going      PT LONG TERM GOAL  #10   TITLE  Pt will report improvemetn of her stool consistency from Type 1 to Type 4 across 75% of the time) in order to improve GI function and to minimize  straining for better positioning of her bladder    Time  12    Period  Weeks    Status  On-going            Plan - 10/14/17 0904    Clinical Impression Statement  Pt required manual Tx to release thoracic area which appeared restricted. Pt required cues to facilitate proper expansion and descent of thorax with respiration to release tightness at thoracic region. Pt felt her LBP decrease from 3/10 to 1/10. Provided HEP to maintain thoracic flexbility and at work. Educated about ergonomic adjustment to her work station. Pt voiced understanding. Anticipate with pt's improved diaphramgatic excursion after Tx today, pt;s LBP and pelvic issues will improve with further Tx. Plan to communicate with PCP about OSA screening given pt's risk factors with snoring and feeling tired upon waking. Pt continues to benefit from skilled PT.    Rehab Potential  Good    PT Frequency  1x / week    PT Duration  12 weeks    PT Treatment/Interventions  ADLs/Self Care Home Management;Electrical Stimulation;Cryotherapy;Ultrasound;Traction;Moist Heat;Iontophoresis 73m/ml Dexamethasone;Functional mobility training;Therapeutic activities;Therapeutic exercise;Patient/family education;Neuromuscular re-education;Manual techniques;Scar mobilization;Passive range of motion;Visual/perceptual remediation/compensation;Taping;Splinting;Energy conservation;Dry needling;Biofeedback;Aquatic Therapy;Manual lymph drainage;Balance training;Stair training    Consulted and Agree with Plan of Care  Patient       Patient will benefit from skilled therapeutic intervention in order to improve the following deficits and impairments:  Abnormal gait, Decreased activity tolerance, Decreased coordination, Decreased range of motion, Decreased strength, Hypomobility, Impaired flexibility, Impaired perceived functional ability, Impaired UE functional use, Postural dysfunction, Improper body mechanics, Pain, Decreased mobility, Decreased scar mobility,  Increased muscle spasms  Visit Diagnosis: Other idiopathic scoliosis, thoracolumbar region  Unequal leg length  Other lack of coordination  Abnormal posture  Acute pain of right shoulder  Stiffness of right shoulder, not elsewhere classified     Problem List Patient Active Problem List   Diagnosis Date Noted  . Hypertriglyceridemia 05/17/2017  . Perimenopausal 02/13/2016  . 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 Numbness of right foot 11/29/2015  . Elevated hemoglobin A1c 11/29/2015  . Osteopenia 11/29/2015  . Vitamin D insufficiency 11/29/2015  . Palpitations   . [place]   . Deafness in left ear   . Anxiety 04/08/2013  . Benign essential tremor 04/08/2013  . Carpal tunnel syndrome 04/08/2013  . Depression 04/08/2013    YJerl Mina,PT, DPT, E-RYT  10/14/2017, 9:05 AM  [place] [facility] [address] [address]Phone: [phone]  Fax:  [phone] Name: Carolyn BUESCHERMRN: [id number] of Birth: [birth year]

## 2017-10-21 ENCOUNTER — Ambulatory Visit: Payer: 59 | Admitting: Physical Therapy

## 2017-10-21 DIAGNOSIS — R293 Abnormal posture: Secondary | ICD-10-CM | POA: Diagnosis not present

## 2017-10-21 DIAGNOSIS — M25511 Pain in right shoulder: Secondary | ICD-10-CM | POA: Diagnosis not present

## 2017-10-21 DIAGNOSIS — M2170 Unequal limb length (acquired), unspecified site: Secondary | ICD-10-CM

## 2017-10-21 DIAGNOSIS — R278 Other lack of coordination: Secondary | ICD-10-CM | POA: Diagnosis not present

## 2017-10-21 DIAGNOSIS — M25611 Stiffness of right shoulder, not elsewhere classified: Secondary | ICD-10-CM | POA: Diagnosis not present

## 2017-10-21 DIAGNOSIS — M4125 Other idiopathic scoliosis, thoracolumbar region: Secondary | ICD-10-CM

## 2017-10-21 NOTE — Therapy (Signed)
[place] MAIN [facility] SERVICES [address] [place], [place], [address] Phone: [phone]   Fax:  [phone]  Physical Therapy Treatment  Patient Details  Name: Carolyn [name] MRN: [id number] Date of Birth: 06-25-1975 Referring Provider (PT): Zara Council   Encounter Date: 10/21/2017  PT End of Session - 10/21/17 0917    Visit Number  4    Number of Visits  12    Date for PT Re-Evaluation  12/24/17    PT Start Time  0805    PT Stop Time  0845    PT Time Calculation (min)  40 min    Activity Tolerance  Patient tolerated treatment well;No increased pain    Behavior During Therapy  WFL for tasks assessed/performed       Past Medical History:  Diagnosis Date  . Anemia    a. low-normal H/H  . Anxiety   . Arthritis    osteopenia  . Benign essential tremor    a. bilateral upper and lower extremities; b. followed by [facility]; c. felt to be 2/2 antidepressants   . Carpal tunnel syndrome   . Deafness in left ear   . Palpitations     Past Surgical History:  Procedure Laterality Date  . axillary lymph node removal Right 1982   enlarged lymph node  . IUD REMOVAL N/A 04/23/2014   Procedure: INTRAUTERINE DEVICE (IUD) REMOVAL;  Surgeon: Everlene Farrier, MD;  Location: [facility];  Service: Gynecology;  Laterality: N/A;  . LAPAROSCOPIC TUBAL LIGATION Bilateral 04/23/2014   Procedure: LAPAROSCOPIC TUBAL LIGATION with Filshie Clips;  Surgeon: Everlene Farrier, MD;  Location: [facility];  Service: Gynecology;  Laterality: Bilateral;    There were no vitals filed for this visit.  Subjective Assessment - 10/21/17 0807    Subjective  Pt reported she had leakage with cough. Pt has contacted her PCP about a sleep study. She has an appt Oct 21. Her low back feel sore but no painful anymore.     Pertinent History  Perimenopausal, osteopenia, anxiety and depression are being managed with medication, Gyn Hx: tubal ligation, episiotomy with first and only child, Physical  activity: "very little". Pt prefers to exercise with other people. In the past, pt used to go to the gym ( elliptical for 5K).  Pt has not pursued weight lifting since college. Pt used to ride bicycle for 20 mi rides. Family used to also do kayaking.  Pt would like to start bicycle.     Patient Stated Goals  Address all issues above.  Return to 20 mi bike riding .  Wean off Myrbetriq, sleeping meds while working with her psychiatrist and doctors          [facility] PT Assessment - 10/21/17 0810      Palpation   SI assessment   95 cm L, 94 cm R                 Pelvic Floor Special Questions - 10/21/17 0910    Prolapse  Anterior Wall   distal to pubic symphysis , within introitus    Pelvic Floor Internal Exam  pt consented verbally without contraindications    Exam Type  Vaginal    Palpation  increased tensions at R urethra compressae and anterior aspect of obt int/ puborectalis,  R anterior mm delayed pre Tx.  improved cranial movement of R anterior post Tx    Strength  good squeeze, good lift, able to hold agaisnt strong resistance  Strength # of reps  7    Strength # of seconds  1        [facility] Adult PT Treatment/Exercise - 10/21/17 0911      Neuro Re-ed    Neuro Re-ed Details   see pt instructions       Manual Therapy   Internal Pelvic Floor  STM/ MWM at problem areas noted in assessment                PT Short Term Goals - 11/28/16 0810      PT SHORT TERM GOAL #1   Title  Patient will be independent in home exercise program to improve strength/mobility for better functional independence with ADLs.    Baseline  11/21: independent    Time  2    Period  Weeks    Status  Achieved      PT SHORT TERM GOAL #2   Title  Patient will be able to perform household work/ chores without increase in symptoms.    Baseline  pain occasionally increases to 2/10    Time  2    Period  Weeks    Status  Partially Met        PT Long Term Goals - 10/07/17 1310      PT  LONG TERM GOAL #1   Title  Pt will decrease her NIH-CPSI score from 19% to < 9% in order to improve pelvic floor function    Time  12    Period  Weeks    Status  On-going      PT LONG TERM GOAL #2   Title  Pt will decrease her COREFO score from 22% to < 12% in order to improve bowel function and have more regular bowel movements    Time  8    Period  Weeks    Status  On-going      PT LONG TERM GOAL #3   Title  Pt will demo equal iliac crest alignment across 2 visits with compliance of shoe lift wear in order to progress to pelvic floor coordiantion and strengthening    Time  4    Period  Weeks    Status  On-going      PT LONG TERM GOAL #4   Title  Pt will demo proper deep core coordination, proer pelvic floor coordination without abdominal overuse in order to improve intraabdominal pressure to minimize pelvic floor dysfunctions    Time  8    Period  Weeks    Status  On-going      PT LONG TERM GOAL #5   Title  Pt will demo proper body mechanics at work, home, and fitness routine in order to minimzie worsening of prolapse     Time  12    Period  Weeks    Status  On-going      PT LONG TERM GOAL #6   Title  Pt will decrease R thoracic mm tensions and increase diaphragmatic excursion in order to improve spinal deviations 2/2 scoliosis    Time  4    Period  Weeks    Status  On-going      PT LONG TERM GOAL #7   Title  Pt will decrease her PSQI score from 43% to <33 % and be referred to a sleep study to screen for OSA in order to improve sleep quality for overall wellness     Time  8    Period  Weeks  Status  On-going      PT LONG TERM GOAL #8   Title  Pt will report no "pinching" sensation at her groin occuring across 1 month in order to improve QOL    Time  10    Period  Weeks    Status  On-going      PT LONG TERM GOAL  #9   TITLE  Pt will report no SIJ issues across 1 month with compliance of scolios-specific HEP in order to better manage her scoliosis    Time  4     Period  Weeks    Status  On-going      PT LONG TERM GOAL  #10   TITLE  Pt will report improvemetn of her stool consistency from Type 1 to Type 4 across 75% of the time) in order to improve GI function and to minimize straining for better positioning of her bladder    Time  12    Period  Weeks    Status  On-going            Plan - 10/21/17 0917    Clinical Impression Statement  Pt demo'd decreased tensions at 2nd layer of R pelvic floor (urethra compressae/ anterior aspect of obt int and puborectalis) post Tx. Pt demo'd increased ROM and co-activation of R muscles, eliciting a circumferential and sequential contraction with a stronger strength from 3/5 to 4/5.  Pt continues to benefit from skilled PT.      Rehab Potential  Good    PT Frequency  1x / week    PT Duration  12 weeks    PT Treatment/Interventions  ADLs/Self Care Home Management;Electrical Stimulation;Cryotherapy;Ultrasound;Traction;Moist Heat;Iontophoresis 86m/ml Dexamethasone;Functional mobility training;Therapeutic activities;Therapeutic exercise;Patient/family education;Neuromuscular re-education;Manual techniques;Scar mobilization;Passive range of motion;Visual/perceptual remediation/compensation;Taping;Splinting;Energy conservation;Dry needling;Biofeedback;Aquatic Therapy;Manual lymph drainage;Balance training;Stair training    Consulted and Agree with Plan of Care  Patient       Patient will benefit from skilled therapeutic intervention in order to improve the following deficits and impairments:  Abnormal gait, Decreased activity tolerance, Decreased coordination, Decreased range of motion, Decreased strength, Hypomobility, Impaired flexibility, Impaired perceived functional ability, Impaired UE functional use, Postural dysfunction, Improper body mechanics, Pain, Decreased mobility, Decreased scar mobility, Increased muscle spasms  Visit Diagnosis: Other idiopathic scoliosis, thoracolumbar region  Unequal leg  length  Other lack of coordination  Abnormal posture  Acute pain of right shoulder  Stiffness of right shoulder, not elsewhere classified     Problem List Patient Active Problem List   Diagnosis Date Noted  . Hypertriglyceridemia 05/17/2017  . Perimenopausal 02/13/2016  . 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 Numbness of right foot 11/29/2015  . Elevated hemoglobin A1c 11/29/2015  . Osteopenia 11/29/2015  . Vitamin D insufficiency 11/29/2015  . Palpitations   . Recurrent major depression in full remission ([facility]   . Deafness in left ear   . Anxiety 04/08/2013  . Benign essential tremor 04/08/2013  . Carpal tunnel syndrome 04/08/2013  . Depression 04/08/2013    YJerl Mina ,PT, DPT, E-RYT  10/21/2017, 9:20 AM  [place] [facility] [address] [address]Phone: [phone]  Fax:  [phone] Name: ALORREE MILLARMRN: [id number] of Birth: 326-Sep-1977

## 2017-10-21 NOTE — Patient Instructions (Signed)
5 reps each to release R pelvic floor   R knee Knee to chest   Knee towards L armpit   ___  Can do on both sides    ____   Open book    ____ Deep core level 1 ( 3 breaths, 7 quick squeeze with breathing )    Deep core level 2   ( remember :  Practice proper pelvic floor coordination  Inhale: expand pelvic floor muscles Exhale" "j" scoop, allow pelvic floor to close, lift first before belly sinks   ( not "draw abdominal muscle to spine" or strain with abdominal muscles")

## 2017-10-28 ENCOUNTER — Ambulatory Visit (INDEPENDENT_AMBULATORY_CARE_PROVIDER_SITE_OTHER): Payer: 59 | Admitting: Family Medicine

## 2017-10-28 ENCOUNTER — Encounter: Payer: Self-pay | Admitting: Family Medicine

## 2017-10-28 ENCOUNTER — Other Ambulatory Visit: Payer: Self-pay | Admitting: Family Medicine

## 2017-10-28 VITALS — BP 123/62 | HR 82 | Temp 99.1°F | Resp 16 | Ht 69.0 in | Wt 189.6 lb

## 2017-10-28 DIAGNOSIS — F419 Anxiety disorder, unspecified: Secondary | ICD-10-CM

## 2017-10-28 DIAGNOSIS — M47816 Spondylosis without myelopathy or radiculopathy, lumbar region: Secondary | ICD-10-CM

## 2017-10-28 DIAGNOSIS — F3342 Major depressive disorder, recurrent, in full remission: Secondary | ICD-10-CM

## 2017-10-28 DIAGNOSIS — R0683 Snoring: Secondary | ICD-10-CM | POA: Diagnosis not present

## 2017-10-28 DIAGNOSIS — R7309 Other abnormal glucose: Secondary | ICD-10-CM

## 2017-10-28 DIAGNOSIS — E559 Vitamin D deficiency, unspecified: Secondary | ICD-10-CM

## 2017-10-28 DIAGNOSIS — E781 Pure hyperglyceridemia: Secondary | ICD-10-CM

## 2017-10-28 DIAGNOSIS — F5104 Psychophysiologic insomnia: Secondary | ICD-10-CM | POA: Diagnosis not present

## 2017-10-28 DIAGNOSIS — Z0000 Encounter for general adult medical examination without abnormal findings: Secondary | ICD-10-CM

## 2017-10-28 NOTE — Assessment & Plan Note (Signed)
Currently stable chronic problem, on med regimen - recent adjustments per Psych  Managed by Psychiatry - Recently on Rexulti now, off Abilify - On lower Trazodone 50mg , instead of 100-150 to reduce snoring and sleep problem

## 2017-10-28 NOTE — Patient Instructions (Addendum)
Thank you for coming to the office today.  Please request copy of next Pap Smear / Mammo / DEXA results  Low risk for Sleep Apnea today - some questions based on sleepiness but most likely due to medication / mood / and sleep habits in general.  Let me know if new concerns for sleep - witnessed stop breathing / apnea - or waking up due to coughing, short of breath, choking  DUE for FASTING BLOOD WORK (no food or drink after midnight before the lab appointment, only water or coffee without cream/sugar on the morning of)  SCHEDULE "Lab Only" visit in the morning at the clinic for lab draw in 9 MONTHS   - Make sure Lab Only appointment is at about 1 week before your next appointment, so that results will be available  For Lab Results, once available within 2-3 days of blood draw, you can can log in to MyChart online to view your results and a brief explanation. Also, we can discuss results at next follow-up visit.   Sleep Hygiene Recommendations to promote healthy sleep in all patients, especially if symptoms of insomnia are worsening. Due to the nature of sleep rhythms, if your body gets "out of rhythm", it may take some time before your sleep cycle can be "reset".  Please try to follow as many of the following tips as you can, usually there are only a few of these are the primary cause of the problem.  ?To reset your sleep rhythm, go to bed and get up at the same time every day ?Sleep only long enough to feel rested and then get out of bed ?Do not try to force yourself to sleep. If you can't sleep, get out of bed and try again later. ?Avoid naps during the day, unless excessively tired. The more sleeping during the day, then the less sleep your body needs at night.  ?Have coffee, tea, and other foods that have caffeine only in the morning ?Exercise several days a week, but not right before bed ?If you drink alcohol, prefer to have appropriate drink with one meal, but prefer to avoid  alcohol in the evening, and bedtime ?If you smoke, avoid smoking, especially in the evening  ?Avoid watching TV or looking at phones, computers, or reading devices ("e-books") that give off light at least 30 minutes before bed. This artificial light sends "awake signals" to your brain and can make it harder to fall asleep. ?Make your bedroom a comfortable place where it is easy to fall asleep: ? Put up shades or special blackout curtains to block light from outside. ? Use a white noise machine to block noise. ? Keep the temperature cool. ?Try your best to solve or at least address your problems before you go to bed ?Use relaxation techniques to manage stress. Ask your health care provider to suggest some techniques that may work well for you. These may include: ? Breathing exercises. ? Routines to release muscle tension. ? Visualizing peaceful scenes.   Please schedule a Follow-up Appointment to: Return in about 9 months (around 07/29/2018) for Annual Physical.  If you have any other questions or concerns, please feel free to call the office or send a message through MyChart. You may also schedule an earlier appointment if necessary.  Additionally, you may be receiving a survey about your experience at our office within a few days to 1 week by e-mail or mail. We value your feedback.  Saralyn Pilar, DO [facility],  [facility]

## 2017-10-28 NOTE — Progress Notes (Signed)
Subjective:    Patient ID: Carolyn [name], female    DOB: 1975/07/17, 42 y.o.   MRN: [id number]  Carolyn [name] is a 42 y.o. female presenting on 10/28/2017 for Insomnia (needs to talk about sleep study)   HPI   Suspected Sleep Apnea / Insomnia / Chronic Major Depression in remission Recently questioned about some snoring at night, she does not recall this but stated that her husband said she snores and was concerned. She does not recall or know of any apnea episodes waking up at night cant breath, or any episodes of "paused" or altered breathing witnessed. - She feels no change in how rested she is in morning, not worse, she does feel mostly rested - Chronic problem followed by Psych, per chart review has been on variety of trials of many medications for mood including Zoloft, Lexapro, Fluoxetine, Celexa, Duloxetine, Venlafaxine, lamictal, risperdal, lithium, latuda - Interval update - since June 2019, Rexulti has replaced her Abilify now - Today overall doing well with mood and insomnia - See med list for med rec - Taking reduced Trazodone down to 50mg  nightly - with good results still, cutting 100mg  tabs in half - No other new concerns at this time. - also infrequent clonazepam 0.5mg  maybe once weekly - Denies choking gasping dyspnea, chest pain or pressure, syncope, restless leg  Epworth Sleepiness Scale Total Score: 8 Sitting and reading - 3 Watching TV - 1 Sitting inactive in a public place - 0 As a passenger in a car for an hour without a break - 1 Lying down to rest in the afternoon when circumstances permit - 3 Sitting and talking to someone - 0 Sitting quietly after a lunch without alcohol - 0 In a car, while stopped for a few minutes in traffic - 0   STOP-Bang OSA scoring Snoring yes   Tiredness yes   Observed apneas no   Pressure HTN no   BMI > 35 kg/m2 no   Age > 50  no   Neck (female >17 in; Female >16 in)  no 12"  Gender female no   OSA risk low (0-2)  OSA risk  intermediate (3-4)  OSA risk high (5+)  Total: 2 = Low risk    Health Maintenance:  Cervical Cancer Screening - scheduled with [facility] Dr Harold Hedge within a week - will send a report.  Osteopenia > DEXA to be done at GYN as well  Next Mammogram from OB GYN - they will fax [place] copy of report.   Depression screen [facility] 2/9 10/28/2017 07/02/2017  Decreased Interest 0 0  Down, Depressed, Hopeless 0 1  PHQ - 2 Score 0 1  Altered sleeping 0 0  Tired, decreased energy 0 1  Change in appetite 0 0  Feeling bad or failure about yourself  0 0  Trouble concentrating 0 1  Moving slowly or fidgety/restless 0 0  Suicidal thoughts 0 0  PHQ-9 Score 0 3  Difficult doing work/chores Not difficult at all Not difficult at all    Social History   Tobacco Use  . Smoking status: Never Smoker  . Smokeless tobacco: Never Used  Substance Use Topics  . Alcohol use: Yes    Comment: occ  . Drug use: No    Review of Systems Per HPI unless specifically indicated above     Objective:    BP 123/62   Pulse 82   Temp 99.1 F (37.3 C) (Oral)   Resp  16   Ht 5\' 9"  (1.753 m)   Wt 189 lb 9.6 oz (86 kg)   BMI 28.00 kg/m   Wt Readings from Last 3 Encounters:  10/28/17 189 lb 9.6 oz (86 kg)  09/11/17 185 lb (83.9 kg)  08/19/17 185 lb (83.9 kg)    Physical Exam  Constitutional: She is oriented to person, place, and time. She appears well-developed and well-nourished. No distress.  Well-appearing, comfortable, cooperative  HENT:  Head: Normocephalic and atraumatic.  Mouth/Throat: Oropharynx is clear and moist.  Oropharynx clear without erythema, exudates, edema or asymmetry.  Mallampati Score 1 - Complete visualization of entire oropharynx soft palate   Eyes: Conjunctivae are normal. Right eye exhibits no discharge. Left eye exhibits no discharge.  Neck: Normal range of motion. Neck supple. No thyromegaly present.  Neck circumference 15"  Cardiovascular: Normal  rate.  Pulmonary/Chest: Effort normal and breath sounds normal. No respiratory distress. She has no wheezes. She has no rales.  Musculoskeletal: She exhibits no edema.  Neurological: She is alert and oriented to person, place, and time.  Skin: Skin is warm and dry. No rash noted. She is not diaphoretic. No erythema.  Psychiatric: She has a normal mood and affect. Her behavior is normal.  Well groomed, good eye contact, normal speech and thoughts  Nursing note and vitals reviewed.  Results for orders placed or performed in visit on 09/11/17  Bladder Scan (Post Void Residual) in office  Result Value Ref Range   Scan Result 0       Assessment & Plan:   Problem List Items Addressed This Visit    Recurrent major depression in full remission (HCC) - Primary    Currently stable chronic problem, on med regimen - recent adjustments per Psych  Managed by Psychiatry - Recently on Rexulti now, off Abilify - On lower Trazodone 50mg , instead of 100-150 to reduce snoring and sleep problem       Other Visit Diagnoses    Psychophysiological insomnia       Habitual snoring         Questionable new problem with possible snoring and sleep disturbances, thought related to insomnia / mood Daytime sleepiness / tiredness but not changed recently - Screening: ESS score 8 (low to mild risk) / STOP-Bang Score 2 low risk - Neck Circumference: 15" - low risk - Co-morbidities: Mood Disorder  Plan: 1. Discussion on initial diagnosis and testing for OSA, risk factors, management, complications 2. For now we agree to HOLD OFF testing at this time, based on lower risk screening in office and clinical history - reconsider in future if symptoms change or new concerns. - Agree with reduced Trazodone, may be med induced problem   No orders of the defined types were placed in this encounter.   Follow up plan: Return in about 9 months (around 07/29/2018) for Annual Physical.  Future labs ordered for  07/22/18  Saralyn Pilar, DO [facility] 10/28/2017, 1:45 PM

## 2017-10-29 ENCOUNTER — Ambulatory Visit: Payer: 59 | Admitting: Physical Therapy

## 2017-10-29 DIAGNOSIS — R293 Abnormal posture: Secondary | ICD-10-CM | POA: Diagnosis not present

## 2017-10-29 DIAGNOSIS — M2170 Unequal limb length (acquired), unspecified site: Secondary | ICD-10-CM | POA: Diagnosis not present

## 2017-10-29 DIAGNOSIS — M25511 Pain in right shoulder: Secondary | ICD-10-CM

## 2017-10-29 DIAGNOSIS — M25611 Stiffness of right shoulder, not elsewhere classified: Secondary | ICD-10-CM

## 2017-10-29 DIAGNOSIS — R278 Other lack of coordination: Secondary | ICD-10-CM | POA: Diagnosis not present

## 2017-10-29 DIAGNOSIS — M4125 Other idiopathic scoliosis, thoracolumbar region: Secondary | ICD-10-CM | POA: Diagnosis not present

## 2017-10-29 NOTE — Therapy (Signed)
[place] MAIN [facility] SERVICES [address] [place], [place], [address] Phone: [phone]   Fax:  [phone]  Physical Therapy Treatment  Patient Details  Name: Carolyn [name] MRN: [id number] Date of Birth: 12-Apr-1975 Referring Provider (PT): Zara Council   Encounter Date: 10/29/2017  PT End of Session - 10/29/17 1215    Visit Number  5    Number of Visits  12    Date for PT Re-Evaluation  12/24/17    PT Start Time  0810    PT Stop Time  0848    PT Time Calculation (min)  38 min    Activity Tolerance  Patient tolerated treatment well;No increased pain    Behavior During Therapy  WFL for tasks assessed/performed       Past Medical History:  Diagnosis Date  . Anemia    a. low-normal H/H  . Anxiety   . Arthritis    osteopenia  . Benign essential tremor    a. bilateral upper and lower extremities; b. followed by [facility]; c. felt to be 2/2 antidepressants   . Carpal tunnel syndrome   . Deafness in left ear   . Palpitations     Past Surgical History:  Procedure Laterality Date  . axillary lymph node removal Right 1982   enlarged lymph node  . IUD REMOVAL N/A 04/23/2014   Procedure: INTRAUTERINE DEVICE (IUD) REMOVAL;  Surgeon: Everlene Farrier, MD;  Location: [facility];  Service: Gynecology;  Laterality: N/A;  . LAPAROSCOPIC TUBAL LIGATION Bilateral 04/23/2014   Procedure: LAPAROSCOPIC TUBAL LIGATION with Filshie Clips;  Surgeon: Everlene Farrier, MD;  Location: [facility];  Service: Gynecology;  Laterality: Bilateral;    There were no vitals filed for this visit.  Subjective Assessment - 10/29/17 0818    Subjective  Pt reported her urine stream is still to the R    Pertinent History  Perimenopausal, osteopenia, anxiety and depression are being managed with medication, Gyn Hx: tubal ligation, episiotomy with first and only child, Physical activity: "very little". Pt prefers to exercise with other people. In the past, pt used to go to the gym (  elliptical for 5K).  Pt has not pursued weight lifting since college. Pt used to ride bicycle for 20 mi rides. Family used to also do kayaking.  Pt would like to start bicycle.     Patient Stated Goals  Address all issues above.  Return to 20 mi bike riding .  Wean off Myrbetriq, sleeping meds while working with her psychiatrist and doctors          [facility] PT Assessment - 10/29/17 1211      Coordination   Gross Motor Movements are Fluid and Coordinated  --   excessive overuse with obliques with pelvic floor               Pelvic Floor Special Questions - 10/29/17 1211    Prolapse  Anterior Wall   distal to pubic symphysis , within introitus    Pelvic Floor Internal Exam  pt consented verbally without contraindications    Exam Type  Vaginal    Palpation  improved sequential movement of anterior mm after Tx . less tightness at R urethra compressar compared to last session, increased tightness on L pubic tubercle area, B bulbospongiosus     Strength  good squeeze, good lift, able to hold agaisnt strong resistance    Strength # of reps  7    Strength # of seconds  1        [facility] Adult PT Treatment/Exercise - 10/29/17 1212      Neuro Re-ed    Neuro Re-ed Details   see pt instructions       Exercises   Exercises  --   see pt instructions     Manual Therapy   Internal Pelvic Floor  STM/ MWM at problem areas noted in assessment                PT Short Term Goals - 11/28/16 0810      PT SHORT TERM GOAL #1   Title  Patient will be independent in home exercise program to improve strength/mobility for better functional independence with ADLs.    Baseline  11/21: independent    Time  2    Period  Weeks    Status  Achieved      PT SHORT TERM GOAL #2   Title  Patient will be able to perform household work/ chores without increase in symptoms.    Baseline  pain occasionally increases to 2/10    Time  2    Period  Weeks    Status  Partially Met        PT Long  Term Goals - 10/07/17 1310      PT LONG TERM GOAL #1   Title  Pt will decrease her NIH-CPSI score from 19% to < 9% in order to improve pelvic floor function    Time  12    Period  Weeks    Status  On-going      PT LONG TERM GOAL #2   Title  Pt will decrease her COREFO score from 22% to < 12% in order to improve bowel function and have more regular bowel movements    Time  8    Period  Weeks    Status  On-going      PT LONG TERM GOAL #3   Title  Pt will demo equal iliac crest alignment across 2 visits with compliance of shoe lift wear in order to progress to pelvic floor coordiantion and strengthening    Time  4    Period  Weeks    Status  On-going      PT LONG TERM GOAL #4   Title  Pt will demo proper deep core coordination, proer pelvic floor coordination without abdominal overuse in order to improve intraabdominal pressure to minimize pelvic floor dysfunctions    Time  8    Period  Weeks    Status  On-going      PT LONG TERM GOAL #5   Title  Pt will demo proper body mechanics at work, home, and fitness routine in order to minimzie worsening of prolapse     Time  12    Period  Weeks    Status  On-going      PT LONG TERM GOAL #6   Title  Pt will decrease R thoracic mm tensions and increase diaphragmatic excursion in order to improve spinal deviations 2/2 scoliosis    Time  4    Period  Weeks    Status  On-going      PT LONG TERM GOAL #7   Title  Pt will decrease her PSQI score from 43% to <33 % and be referred to a sleep study to screen for OSA in order to improve sleep quality for overall wellness     Time  8    Period  Weeks  Status  On-going      PT LONG TERM GOAL #8   Title  Pt will report no "pinching" sensation at her groin occuring across 1 month in order to improve QOL    Time  10    Period  Weeks    Status  On-going      PT LONG TERM GOAL  #9   TITLE  Pt will report no SIJ issues across 1 month with compliance of scolios-specific HEP in order to better  manage her scoliosis    Time  4    Period  Weeks    Status  On-going      PT LONG TERM GOAL  #10   TITLE  Pt will report improvemetn of her stool consistency from Type 1 to Type 4 across 75% of the time) in order to improve GI function and to minimize straining for better positioning of her bladder    Time  12    Period  Weeks    Status  On-going            Plan - 10/29/17 1216    Clinical Impression Statement  Pt demo'd decreased R pelvic floor tightness but still required manual Tx to decrease L mm tightness in order to elicit a more sequential cranial lift of anterior pelvic floor mm without overuse of abdominal mm. Pt demo'd correctly. Educated pt to avoid seating with knees together, focus on knee alignment in standing stretches/ yoga poses to minimize tightness form sedentary desk job. Educated pt on ways to minimize tightness of pelvic floor mm by going to urinate with first urge to go and avoid delaying urination. Pt continues to benefit from skilled PT.    Rehab Potential  Good    PT Frequency  1x / week    PT Duration  12 weeks    PT Treatment/Interventions  ADLs/Self Care Home Management;Electrical Stimulation;Cryotherapy;Ultrasound;Traction;Moist Heat;Iontophoresis 103m/ml Dexamethasone;Functional mobility training;Therapeutic activities;Therapeutic exercise;Patient/family education;Neuromuscular re-education;Manual techniques;Scar mobilization;Passive range of motion;Visual/perceptual remediation/compensation;Taping;Splinting;Energy conservation;Dry needling;Biofeedback;Aquatic Therapy;Manual lymph drainage;Balance training;Stair training    Consulted and Agree with Plan of Care  Patient       Patient will benefit from skilled therapeutic intervention in order to improve the following deficits and impairments:  Abnormal gait, Decreased activity tolerance, Decreased coordination, Decreased range of motion, Decreased strength, Hypomobility, Impaired flexibility, Impaired  perceived functional ability, Impaired UE functional use, Postural dysfunction, Improper body mechanics, Pain, Decreased mobility, Decreased scar mobility, Increased muscle spasms  Visit Diagnosis: Other idiopathic scoliosis, thoracolumbar region  Unequal leg length  Other lack of coordination  Abnormal posture  Acute pain of right shoulder  Stiffness of right shoulder, not elsewhere classified     Problem List Patient Active Problem List   Diagnosis Date Noted  . Hypertriglyceridemia 05/17/2017  . Perimenopausal 02/13/2016  . 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 Numbness of right foot 11/29/2015  . Elevated hemoglobin A1c 11/29/2015  . Osteopenia 11/29/2015  . Vitamin D insufficiency 11/29/2015  . Palpitations   . Recurrent major depression in full remission ([place]   . Deafness in left ear   . Anxiety 04/08/2013  . Benign essential tremor 04/08/2013  . Carpal tunnel syndrome 04/08/2013    YJerl Mina,PT, DPT, E-RYT  10/29/2017, 12:20 PM  [place]MAIN [facility] CampusSERVICES [address]R[place] [address]Phone: [phone]  Fax:  [phone] Name: Carolyn JACKOWSKIMRN: [id number] of Birth: 3Apr 01, 1977

## 2017-10-29 NOTE — Patient Instructions (Addendum)
Less abdominal muscles with exhalation,   Inhalation fully, paced breathing   ______   Practice proper pelvic floor coordination  Inhale: expand pelvic floor muscles Exhale" "j" scoop, allow pelvic floor to close, lift first before belly sinks   ( not "draw abdominal muscle to spine" or strain with abdominal muscles")   _______  Yoga sequence at work ( filmed)   Extended side angle :  Feet are hip width apart, L foot one behind like you are on ski tracks,  R knee bent over ankle but not more forward then the ankle.  Make sure 50% weight is in the front foot/leg , 50% weight is the back foot/ leg    Rest R forearm lightly on top of thigh,  L hand on L shoulder, elbow circles backwards and .  Inhale lengthen spine,   Exhale turn navel to the L then the ribcage turns, look at the other wall.  Keep maintaining  50% weight is in the front foot/leg , 50% weight is the back foot/ leg  And make sure the front knee is still pointed in the toe line of the 2nd toe.    Warrior II:  Raise arm up in front on the same side as your front knee, back arm up behind. Arms are aligned with the length of the mat Inhale,  Exhale and relax shoulders and ribcage  Make sure 50% weight is in the front foot/leg , 50% weight is the back foot/ leg   3 breaths here.     REVERSE twist  :   Feet are hip width apart, R foot one behind like you are on ski tracks,  L knee bent over ankle but not more forward then the ankle.  Make sure 50% weight is in the front foot/leg , 50% weight is the back foot/ leg     R hand on L hip.  Inhale lengthen spine first and reach up with R    Exhale turn navel to the L then the ribcage turns, look at the other wall. Rest R hand on L thigh  Keep maintaining  50% weight is in the front foot/leg , 50% weight is the back foot/ leg  And make sure the front knee is still pointed in the toe line of the 2nd toe.    3 breaths here.    Hip flexor stretch, one foot on  chair, rocking hips  Seated warrior ( 45 deg turn to side)   Figure 4 stretch in chair   ________  Sitting with knees lined up with 2-3rd toes

## 2017-10-30 ENCOUNTER — Other Ambulatory Visit: Payer: Self-pay | Admitting: Urology

## 2017-10-30 MED ORDER — MIRABEGRON ER 25 MG PO TB24: 25.0000 mg | ORAL_TABLET | Freq: Every day | ORAL | 11 refills | Status: DC

## 2017-10-30 NOTE — Progress Notes (Signed)
Rx sent for 30 day script for Mybertriq.

## 2017-11-01 DIAGNOSIS — F322 Major depressive disorder, single episode, severe without psychotic features: Secondary | ICD-10-CM | POA: Diagnosis not present

## 2017-11-05 DIAGNOSIS — Z1231 Encounter for screening mammogram for malignant neoplasm of breast: Secondary | ICD-10-CM | POA: Diagnosis not present

## 2017-11-05 DIAGNOSIS — Z6827 Body mass index (BMI) 27.0-27.9, adult: Secondary | ICD-10-CM | POA: Diagnosis not present

## 2017-11-05 DIAGNOSIS — Z01419 Encounter for gynecological examination (general) (routine) without abnormal findings: Secondary | ICD-10-CM | POA: Diagnosis not present

## 2017-11-05 DIAGNOSIS — Z13820 Encounter for screening for osteoporosis: Secondary | ICD-10-CM | POA: Diagnosis not present

## 2017-11-05 LAB — HM MAMMOGRAPHY

## 2017-11-05 LAB — HM DEXA SCAN

## 2017-11-11 ENCOUNTER — Ambulatory Visit: Payer: 59 | Attending: Urology | Admitting: Physical Therapy

## 2017-11-11 DIAGNOSIS — M2170 Unequal limb length (acquired), unspecified site: Secondary | ICD-10-CM | POA: Diagnosis not present

## 2017-11-11 DIAGNOSIS — M25511 Pain in right shoulder: Secondary | ICD-10-CM | POA: Insufficient documentation

## 2017-11-11 DIAGNOSIS — M4125 Other idiopathic scoliosis, thoracolumbar region: Secondary | ICD-10-CM | POA: Diagnosis not present

## 2017-11-11 DIAGNOSIS — R293 Abnormal posture: Secondary | ICD-10-CM | POA: Insufficient documentation

## 2017-11-11 DIAGNOSIS — M25611 Stiffness of right shoulder, not elsewhere classified: Secondary | ICD-10-CM | POA: Diagnosis not present

## 2017-11-11 DIAGNOSIS — M791 Myalgia, unspecified site: Secondary | ICD-10-CM | POA: Insufficient documentation

## 2017-11-11 DIAGNOSIS — R278 Other lack of coordination: Secondary | ICD-10-CM | POA: Diagnosis not present

## 2017-11-11 NOTE — Therapy (Signed)
[place] MAIN [facility] SERVICES [address] [place], [place], [address] Phone: [phone]   Fax:  [phone]  Physical Therapy Treatment  Patient Details  Name: Carolyn [name] MRN: [id number] Date of Birth: April 07, 1975 Referring Provider (PT): Zara Council   Encounter Date: 11/11/2017  PT End of Session - 11/11/17 0909    Visit Number  6    Number of Visits  12    Date for PT Re-Evaluation  12/24/17    PT Start Time  0805    PT Stop Time  0900    PT Time Calculation (min)  55 min    Activity Tolerance  Patient tolerated treatment well;No increased pain    Behavior During Therapy  WFL for tasks assessed/performed       Past Medical History:  Diagnosis Date  . Anemia    a. low-normal H/H  . Anxiety   . Arthritis    osteopenia  . Benign essential tremor    a. bilateral upper and lower extremities; b. followed by [facility]; c. felt to be 2/2 antidepressants   . Carpal tunnel syndrome   . Deafness in left ear   . Palpitations     Past Surgical History:  Procedure Laterality Date  . axillary lymph node removal Right 1982   enlarged lymph node  . IUD REMOVAL N/A 04/23/2014   Procedure: INTRAUTERINE DEVICE (IUD) REMOVAL;  Surgeon: Everlene Farrier, MD;  Location: [place] [facility] [facility];  Service: Gynecology;  Laterality: N/A;  . LAPAROSCOPIC TUBAL LIGATION Bilateral 04/23/2014   Procedure: LAPAROSCOPIC TUBAL LIGATION with Filshie Clips;  Surgeon: Everlene Farrier, MD;  Location: [facility] [facility];  Service: Gynecology;  Laterality: Bilateral;    There were no vitals filed for this visit.  Subjective Assessment - 11/11/17 0824    Subjective  Pt reported no more deviated urine stream and has been able to control start and stop of urination with breathing.  Pt notices tightness in front R hip upon waking and with stair climbing. Her stairs at home are high and short in depth and she has to duck walk up while carrying two dogs ( 15 lbs -19lbs) each without  hand hold on rail     Pertinent History  Perimenopausal, osteopenia, anxiety and depression are being managed with medication, Gyn Hx: tubal ligation, episiotomy with first and only child, Physical activity: "very little". Pt prefers to exercise with other people. In the past, pt used to go to the gym ( elliptical for 5K).  Pt has not pursued weight lifting since college. Pt used to ride bicycle for 20 mi rides. Family used to also do kayaking.  Pt would like to start bicycle.     Patient Stated Goals  Address all issues above.  Return to 20 mi bike riding .  Wean off Myrbetriq, sleeping meds while working with her psychiatrist and doctors          [facility] PT Assessment - 11/11/17 0825      Strength   Overall Strength  --   excessive R lumbar lordosis LUE/ R hip ext ( posterior sling   Overall Strength Comments  PF heel raises single UE: 15 reps L, 22 rep R       Ambulation/Gait   Stairs  --   poor eccentric control on descent,    Stair Management Technique  --   less R hip tightness w/ cues for PF, push off   Gait Comments  R lateral hip shift , minimal trunk  rotation                    [facility] Adult PT Treatment/Exercise - 11/11/17 0825      Neuro Re-ed    Neuro Re-ed Details   see pt instructions       Exercises   Exercises  --   see pt instructions            PT Education - 11/11/17 0908    Education provided  Yes    Education Details  HEP    Person(s) Educated  Patient    Methods  Explanation;Demonstration;Tactile cues;Verbal cues;Handout    Comprehension  Returned demonstration;Verbalized understanding       PT Short Term Goals - 11/28/16 0810      PT SHORT TERM GOAL #1   Title  Patient will be independent in home exercise program to improve strength/mobility for better functional independence with ADLs.    Baseline  11/21: independent    Time  2    Period  Weeks    Status  Achieved      PT SHORT TERM GOAL #2   Title  Patient will be able to perform household work/  chores without increase in symptoms.    Baseline  pain occasionally increases to 2/10    Time  2    Period  Weeks    Status  Partially Met        PT Long Term Goals - 10/07/17 1310      PT LONG TERM GOAL #1   Title  Pt will decrease her NIH-CPSI score from 19% to < 9% in order to improve pelvic floor function    Time  12    Period  Weeks    Status  On-going      PT LONG TERM GOAL #2   Title  Pt will decrease her COREFO score from 22% to < 12% in order to improve bowel function and have more regular bowel movements    Time  8    Period  Weeks    Status  On-going      PT LONG TERM GOAL #3   Title  Pt will demo equal iliac crest alignment across 2 visits with compliance of shoe lift wear in order to progress to pelvic floor coordiantion and strengthening    Time  4    Period  Weeks    Status  On-going      PT LONG TERM GOAL #4   Title  Pt will demo proper deep core coordination, proer pelvic floor coordination without abdominal overuse in order to improve intraabdominal pressure to minimize pelvic floor dysfunctions    Time  8    Period  Weeks    Status  On-going      PT LONG TERM GOAL #5   Title  Pt will demo proper body mechanics at work, home, and fitness routine in order to minimzie worsening of prolapse     Time  12    Period  Weeks    Status  On-going      PT LONG TERM GOAL #6   Title  Pt will decrease R thoracic mm tensions and increase diaphragmatic excursion in order to improve spinal deviations 2/2 scoliosis    Time  4    Period  Weeks    Status  On-going      PT LONG TERM GOAL #7   Title  Pt will decrease her PSQI score from 43%  to <33 % and be referred to a sleep study to screen for OSA in order to improve sleep quality for overall wellness     Time  8    Period  Weeks    Status  On-going      PT LONG TERM GOAL #8   Title  Pt will report no "pinching" sensation at her groin occuring across 1 month in order to improve QOL    Time  10    Period  Weeks     Status  On-going      PT LONG TERM GOAL  #9   TITLE  Pt will report no SIJ issues across 1 month with compliance of scolios-specific HEP in order to better manage her scoliosis    Time  4    Period  Weeks    Status  On-going      PT LONG TERM GOAL  #10   TITLE  Pt will report improvemetn of her stool consistency from Type 1 to Type 4 across 75% of the time) in order to improve GI function and to minimize straining for better positioning of her bladder    Time  12    Period  Weeks    Status  On-going            Plan - 11/11/17 2159    Clinical Impression Statement  Pt is making great progress with report of increased awareness of more urinary control and less deviated urine flow. Pt demo'd improved gait mechanics/ body mechanics with stair navigation to minimize risk of falls.  Pt continues to benefit from skilled PT.     Rehab Potential  Good    PT Frequency  1x / week    PT Duration  12 weeks    PT Treatment/Interventions  ADLs/Self Care Home Management;Electrical Stimulation;Cryotherapy;Ultrasound;Traction;Moist Heat;Iontophoresis 15m/ml Dexamethasone;Functional mobility training;Therapeutic activities;Therapeutic exercise;Patient/family education;Neuromuscular re-education;Manual techniques;Scar mobilization;Passive range of motion;Visual/perceptual remediation/compensation;Taping;Splinting;Energy conservation;Dry needling;Biofeedback;Aquatic Therapy;Manual lymph drainage;Balance training;Stair training    Consulted and Agree with Plan of Care  Patient       Patient will benefit from skilled therapeutic intervention in order to improve the following deficits and impairments:  Abnormal gait, Decreased activity tolerance, Decreased coordination, Decreased range of motion, Decreased strength, Hypomobility, Impaired flexibility, Impaired perceived functional ability, Impaired UE functional use, Postural dysfunction, Improper body mechanics, Pain, Decreased mobility, Decreased scar  mobility, Increased muscle spasms  Visit Diagnosis: Other idiopathic scoliosis, thoracolumbar region  Unequal leg length  Other lack of coordination  Abnormal posture  Acute pain of right shoulder  Stiffness of right shoulder, not elsewhere classified     Problem List Patient Active Problem List   Diagnosis Date Noted  . Hypertriglyceridemia 05/17/2017  . Perimenopausal 02/13/2016  . 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 Numbness of right foot 11/29/2015  . Elevated hemoglobin A1c 11/29/2015  . Osteopenia 11/29/2015  . Vitamin D insufficiency 11/29/2015  . Palpitations   . Recurrent major depression in full remission ([place]   . Deafness in left ear   . Anxiety 04/08/2013  . Benign essential tremor 04/08/2013  . Carpal tunnel syndrome 04/08/2013    YJerl Mina,PT, DPT, E-RYT  11/11/2017, 10:46 PM  CShaktoolikMAIN RNorth [facility]oscopy IncSERVICES [address] [address]Phone: [phone]  Fax:  [phone] Name: Carolyn FRYBERGERMRN: [id number] of Birth: 3January 12, 1977

## 2017-11-11 NOTE — Patient Instructions (Signed)
ballmounds , toes spread, lower heel slowly on descent of stairs   Up and down stairs, 45 deg position of body , feet can be hip width apart    ___  Open book/ thread the needle in quadriped or at work   ___  No shoe lift this week  ___  Walking with chin tuck, shoulders melt down , arms swing/ trunk rotates, thigh higher, landing more on fore/midfoot

## 2017-11-12 DIAGNOSIS — F322 Major depressive disorder, single episode, severe without psychotic features: Secondary | ICD-10-CM | POA: Diagnosis not present

## 2017-11-18 ENCOUNTER — Ambulatory Visit: Payer: 59 | Admitting: Physical Therapy

## 2017-11-18 DIAGNOSIS — R278 Other lack of coordination: Secondary | ICD-10-CM

## 2017-11-18 DIAGNOSIS — M791 Myalgia, unspecified site: Secondary | ICD-10-CM | POA: Diagnosis not present

## 2017-11-18 DIAGNOSIS — M25511 Pain in right shoulder: Secondary | ICD-10-CM

## 2017-11-18 DIAGNOSIS — M2170 Unequal limb length (acquired), unspecified site: Secondary | ICD-10-CM | POA: Diagnosis not present

## 2017-11-18 DIAGNOSIS — M4125 Other idiopathic scoliosis, thoracolumbar region: Secondary | ICD-10-CM

## 2017-11-18 DIAGNOSIS — M25611 Stiffness of right shoulder, not elsewhere classified: Secondary | ICD-10-CM

## 2017-11-18 DIAGNOSIS — R293 Abnormal posture: Secondary | ICD-10-CM | POA: Diagnosis not present

## 2017-11-18 NOTE — Therapy (Signed)
[place] MAIN [facility] SERVICES [address] [place], [place], [address] Phone: [phone]   Fax:  [phone]  Physical Therapy Treatment  Patient Details  Name: Carolyn [name] MRN: [id number] Date of Birth: [birth year] Referring Provider (PT): Zara Council   Encounter Date: 11/18/2017  PT End of Session - 11/18/17 1022    Visit Number  7    Number of Visits  12    Date for PT Re-Evaluation  12/24/17    PT Start Time  0810    PT Stop Time  0900    PT Time Calculation (min)  50 min    Activity Tolerance  Patient tolerated treatment well;No increased pain    Behavior During Therapy  WFL for tasks assessed/performed       Past Medical History:  Diagnosis Date  . Anemia    a. low-normal H/H  . Anxiety   . Arthritis    osteopenia  . Benign essential tremor    a. bilateral upper and lower extremities; b. followed by [facility]; c. felt to be 2/2 antidepressants   . Carpal tunnel syndrome   . Deafness in left ear   . Palpitations     Past Surgical History:  Procedure Laterality Date  . axillary lymph node removal Right 1982   enlarged lymph node  . IUD REMOVAL N/A 04/23/2014   Procedure: INTRAUTERINE DEVICE (IUD) REMOVAL;  Surgeon: Everlene Farrier, MD;  Location: [facility];  Service: Gynecology;  Laterality: N/A;  . LAPAROSCOPIC TUBAL LIGATION Bilateral 04/23/2014   Procedure: LAPAROSCOPIC TUBAL LIGATION with Filshie Clips;  Surgeon: Everlene Farrier, MD;  Location: [facility];  Service: Gynecology;  Laterality: Bilateral;    There were no vitals filed for this visit.  Subjective Assessment - 11/18/17 0813    Subjective  Pt has not noticed the R hip tightness and pain upon waking since last session.     Pertinent History  Perimenopausal, osteopenia, anxiety and depression are being managed with medication, Gyn Hx: tubal ligation, episiotomy with first and only child, Physical activity: "very little". Pt prefers to exercise with other people. In the  past, pt used to go to the gym ( elliptical for 5K).  Pt has not pursued weight lifting since college. Pt used to ride bicycle for 20 mi rides. Family used to also do kayaking.  Pt would like to start bicycle.     Patient Stated Goals  Address all issues above.  Return to 20 mi bike riding .  Wean off Myrbetriq, sleeping meds while working with her psychiatrist and doctors          [facility] PT Assessment - 11/18/17 0900      Strength   Overall Strength  --   excessive R lumbar lordosis LUE/ R hip ext ( posterior sling   Overall Strength Comments  PF heel raises single UE on floor : 15 reps L, 22 rep R       Ambulation/Gait   Gait Comments  more trunk rotation without cues, cue reuired for increased hip flexion,  and more anterior COM                  Pelvic Floor Special Questions - 11/18/17 0857    Prolapse  Anterior Wall   L anterior wall more cranial to pubic symphysis   Pelvic Floor Internal Exam  pt consented verbally without contraindications    Exam Type  Vaginal    Palpation  R anterior wall no activated  with cue for squeeze. post Tx: circular contraction with lift     Strength  good squeeze, good lift, able to hold agaisnt strong resistance    Strength # of reps  10    Strength # of seconds  1        [facility] Adult PT Treatment/Exercise - 11/18/17 0859      Neuro Re-ed    Neuro Re-ed Details   see pt instructions       Manual Therapy   Internal Pelvic Floor  B  urethra compressae with MWM ( pelvic tilts) with sustained pressure                PT Short Term Goals - 11/28/16 0810      PT SHORT TERM GOAL #1   Title  Patient will be independent in home exercise program to improve strength/mobility for better functional independence with ADLs.    Baseline  11/21: independent    Time  2    Period  Weeks    Status  Achieved      PT SHORT TERM GOAL #2   Title  Patient will be able to perform household work/ chores without increase in symptoms.     Baseline  pain occasionally increases to 2/10    Time  2    Period  Weeks    Status  Partially Met        PT Long Term Goals - 10/07/17 1310      PT LONG TERM GOAL #1   Title  Pt will decrease her NIH-CPSI score from 19% to < 9% in order to improve pelvic floor function    Time  12    Period  Weeks    Status  On-going      PT LONG TERM GOAL #2   Title  Pt will decrease her COREFO score from 22% to < 12% in order to improve bowel function and have more regular bowel movements    Time  8    Period  Weeks    Status  On-going      PT LONG TERM GOAL #3   Title  Pt will demo equal iliac crest alignment across 2 visits with compliance of shoe lift wear in order to progress to pelvic floor coordiantion and strengthening    Time  4    Period  Weeks    Status  On-going      PT LONG TERM GOAL #4   Title  Pt will demo proper deep core coordination, proer pelvic floor coordination without abdominal overuse in order to improve intraabdominal pressure to minimize pelvic floor dysfunctions    Time  8    Period  Weeks    Status  On-going      PT LONG TERM GOAL #5   Title  Pt will demo proper body mechanics at work, home, and fitness routine in order to minimzie worsening of prolapse     Time  12    Period  Weeks    Status  On-going      PT LONG TERM GOAL #6   Title  Pt will decrease R thoracic mm tensions and increase diaphragmatic excursion in order to improve spinal deviations 2/2 scoliosis    Time  4    Period  Weeks    Status  On-going      PT LONG TERM GOAL #7   Title  Pt will decrease her PSQI score from 43% to <33 % and  be referred to a sleep study to screen for OSA in order to improve sleep quality for overall wellness     Time  8    Period  Weeks    Status  On-going      PT LONG TERM GOAL #8   Title  Pt will report no "pinching" sensation at her groin occuring across 1 month in order to improve QOL    Time  10    Period  Weeks    Status  On-going      PT LONG TERM  GOAL  #9   TITLE  Pt will report no SIJ issues across 1 month with compliance of scolios-specific HEP in order to better manage her scoliosis    Time  4    Period  Weeks    Status  On-going      PT LONG TERM GOAL  #10   TITLE  Pt will report improvemetn of her stool consistency from Type 1 to Type 4 across 75% of the time) in order to improve GI function and to minimize straining for better positioning of her bladder    Time  12    Period  Weeks    Status  On-going            Plan - 11/18/17 1023    Clinical Impression Statement  Pt demo'd decreased anterior pelvic floor mm tightness but required further internal manual Tx to release remaining tensions to elicit a more cranial movement of urethra/bladder wall L >R and to elicit a more circumferential and sequential contraction. Pt 's R hip ache and pain is no longer an issue as shoe lift has been disposed and no longer useful. Pt was not ready for deep core level 3.  Added co-activation of deep core with lower kinetic chain/ instrinsic feet mm in walking mechanics retraining. Added thoracolumbar strengthening exercises today.  Pt continues to benefit from skilled PT . Plan to reassess goals at next session   Rehab Potential  Good    PT Frequency  1x / week    PT Duration  12 weeks    PT Treatment/Interventions  ADLs/Self Care Home Management;Electrical Stimulation;Cryotherapy;Ultrasound;Traction;Moist Heat;Iontophoresis 55m/ml Dexamethasone;Functional mobility training;Therapeutic activities;Therapeutic exercise;Patient/family education;Neuromuscular re-education;Manual techniques;Scar mobilization;Passive range of motion;Visual/perceptual remediation/compensation;Taping;Splinting;Energy conservation;Dry needling;Biofeedback;Aquatic Therapy;Manual lymph drainage;Balance training;Stair training    Consulted and Agree with Plan of Care  Patient       Patient will benefit from skilled therapeutic intervention in order to improve the  following deficits and impairments:  Abnormal gait, Decreased activity tolerance, Decreased coordination, Decreased range of motion, Decreased strength, Hypomobility, Impaired flexibility, Impaired perceived functional ability, Impaired UE functional use, Postural dysfunction, Improper body mechanics, Pain, Decreased mobility, Decreased scar mobility, Increased muscle spasms  Visit Diagnosis: Other idiopathic scoliosis, thoracolumbar region  Unequal leg length  Other lack of coordination  Abnormal posture  Acute pain of right shoulder  Stiffness of right shoulder, not elsewhere classified     Problem List Patient Active Problem List   Diagnosis Date Noted  . Hypertriglyceridemia 05/17/2017  . Perimenopausal 02/13/2016  . 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 Numbness of right foot 11/29/2015  . Elevated hemoglobin A1c 11/29/2015  . Osteopenia 11/29/2015  . Vitamin D insufficiency 11/29/2015  . Palpitations   . Recurrent major depression in full remission ([place]   . Deafness in left ear   . Anxiety 04/08/2013  . Benign essential tremor 04/08/2013  . Carpal tunnel syndrome 04/08/2013  Jerl Mina 11/18/2017, 10:27 AM  [place] MAIN [facility] SERVICES [address] [place], [place], [address] Phone: [phone]   Fax:  [phone]  Name: AYARI LIWANAG MRN: [id number] Date of Birth: [birth year]

## 2017-11-18 NOTE — Patient Instructions (Addendum)
Morning/ evening:  Open book  Position pelvis in neutral not anterior tilt Deep core level 1,  5 breaths, J scoop squeeze 10 reps with breath Deep core level  Modified with heel raises, toes spread, 4 courners down ( pelvis and low back)  6 min ( replaces the knee out Level 2)    Stretches:  childs pose rocking Thread the needle      band placed on the bed / floor in a "u" shape  ballmounds  while laying on back w/ knees bent "W" exercise  10 reps x 3 sets   Band is placed under feet, knees bent, feet are hip width apart Hold band with thumbs point out, keep upper arm and elbow touching the bed the whole time  - inhale and then exhale pull bands by bending elbows hands move in a "w"  (feel shoulder blades squeezing)      WALKING WITH RESISTANCE BLUE Band at waist connected to doorknob 2-28mins Stepping forward normal length steps, planting mid and forefoot down, center of mass ( navel) leans forward slightly as if you were walking uphill 3-4 steps till band feels taut ( MAKE SURE THE DOOR IS LOCKED AND WON'T OPEN)   Stepping backwards, lower heel slowly, carry trunk and hips back as you step    Regular walking: Higher thighs, slightly longer strides, trunk rotation, arms swings Shoulders leaning forward slightly to bring center of mass over shins as you land on ballmound/midfoot

## 2017-11-25 ENCOUNTER — Ambulatory Visit: Payer: 59 | Admitting: Physical Therapy

## 2017-11-25 ENCOUNTER — Encounter: Payer: Self-pay | Admitting: Family Medicine

## 2017-11-25 DIAGNOSIS — M25611 Stiffness of right shoulder, not elsewhere classified: Secondary | ICD-10-CM | POA: Diagnosis not present

## 2017-11-25 DIAGNOSIS — M4125 Other idiopathic scoliosis, thoracolumbar region: Secondary | ICD-10-CM | POA: Diagnosis not present

## 2017-11-25 DIAGNOSIS — M2170 Unequal limb length (acquired), unspecified site: Secondary | ICD-10-CM | POA: Diagnosis not present

## 2017-11-25 DIAGNOSIS — M25511 Pain in right shoulder: Secondary | ICD-10-CM

## 2017-11-25 DIAGNOSIS — M791 Myalgia, unspecified site: Secondary | ICD-10-CM | POA: Diagnosis not present

## 2017-11-25 DIAGNOSIS — R278 Other lack of coordination: Secondary | ICD-10-CM | POA: Diagnosis not present

## 2017-11-25 DIAGNOSIS — R293 Abnormal posture: Secondary | ICD-10-CM | POA: Diagnosis not present

## 2017-11-25 NOTE — Therapy (Signed)
[place] MAIN [facility] SERVICES [address] [place], [place], [address] Phone: [phone]   Fax:  [phone]  Physical Therapy Treatment  Patient Details  Name: Carolyn [name] MRN: [id number] Date of Birth: 02-18-1975 Referring Provider (PT): Zara Council   Encounter Date: 11/25/2017  PT End of Session - 11/25/17 0822    Visit Number  8    Number of Visits  12    Date for PT Re-Evaluation  12/24/17    PT Start Time  0815    PT Stop Time  0906    PT Time Calculation (min)  51 min    Activity Tolerance  Patient tolerated treatment well;No increased pain    Behavior During Therapy  WFL for tasks assessed/performed       Past Medical History:  Diagnosis Date  . Anemia    a. low-normal H/H  . Anxiety   . Arthritis    osteopenia  . Benign essential tremor    a. bilateral upper and lower extremities; b. followed by [facility]; c. felt to be 2/2 antidepressants   . Carpal tunnel syndrome   . Deafness in left ear   . Palpitations     Past Surgical History:  Procedure Laterality Date  . axillary lymph node removal Right 1982   enlarged lymph node  . IUD REMOVAL N/A 04/23/2014   Procedure: INTRAUTERINE DEVICE (IUD) REMOVAL;  Surgeon: Everlene Farrier, MD;  Location: [facility];  Service: Gynecology;  Laterality: N/A;  . LAPAROSCOPIC TUBAL LIGATION Bilateral 04/23/2014   Procedure: LAPAROSCOPIC TUBAL LIGATION with Filshie Clips;  Surgeon: Everlene Farrier, MD;  Location: [facility];  Service: Gynecology;  Laterality: Bilateral;    There were no vitals filed for this visit.  Subjective Assessment - 11/25/17 0821    Subjective  Pt noticed she was not able to do 30 reps of the band exercise due to tight neck muscles. Pt has been using Biofreeze/Ibuprofen to relieve the neck tightness.  Today the neck tightness and pain is on the L at 5/10, Pt notices when the L shoulder is tight, she has numbness and tingling under the upper arm and pinky but usually it is the  thumb side     Pertinent History  Perimenopausal, osteopenia, anxiety and depression are being managed with medication, Gyn Hx: tubal ligation, episiotomy with first and only child, Physical activity: "very little". Pt prefers to exercise with other people. In the past, pt used to go to the gym ( elliptical for 5K).  Pt has not pursued weight lifting since college. Pt used to ride bicycle for 20 mi rides. Family used to also do kayaking.  Pt would like to start bicycle.     Patient Stated Goals  Address all issues above.  Return to 20 mi bike riding .  Wean off Myrbetriq, sleeping meds while working with her psychiatrist and doctors          [facility] PT Assessment - 11/25/17 0826      Sensation   Light Touch  Impaired Detail   C7-T1 on L decreased to pinky) ( post Tx: mild intensity)     Coordination   Gross Motor Movements are Fluid and Coordinated  --   scap dyskinesis on L ( delayed in downward rotation)    Fine Motor Movements are Fluid and Coordinated  --   no scap dyskinesis post Tx     Palpation   Palpation comment  increased levator scapulae L, hypomobility in L rotation  at [facility] Adult PT Treatment/Exercise - 11/25/17 0001      Neuro Re-ed    Neuro Re-ed Details   see pt instructions       Exercises   Exercises  --   see pt instructions     Manual Therapy   Manual therapy comments  distraction at occiput, rotational MWM with PA mob Grade III at T7-10, STM at levator with neural glides (ulnar)                PT Short Term Goals - 11/28/16 0810      PT SHORT TERM GOAL #1   Title  Patient will be independent in home exercise program to improve strength/mobility for better functional independence with ADLs.    Baseline  11/21: independent    Time  2    Period  Weeks    Status  Achieved      PT SHORT TERM GOAL #2   Title  Patient will be able to perform household work/ chores without increase in symptoms.    Baseline  pain  occasionally increases to 2/10    Time  2    Period  Weeks    Status  Partially Met        PT Long Term Goals - 11/25/17 0910      PT LONG TERM GOAL #1   Title  Pt will decrease her NIH-CPSI score from 19% to < 9% in order to improve pelvic floor function    Time  12    Period  Weeks    Status  On-going      PT LONG TERM GOAL #2   Title  Pt will decrease her COREFO score from 22% to < 12% in order to improve bowel function and have more regular bowel movements    Time  8    Period  Weeks    Status  On-going      PT LONG TERM GOAL #3   Title  Pt will demo equal iliac crest alignment across 2 visits with compliance of shoe lift wear in order to progress to pelvic floor coordiantion and strengthening    Time  4    Period  Weeks    Status  On-going      PT LONG TERM GOAL #4   Title  Pt will demo proper deep core coordination, proer pelvic floor coordination without abdominal overuse in order to improve intraabdominal pressure to minimize pelvic floor dysfunctions    Time  8    Period  Weeks    Status  On-going      PT LONG TERM GOAL #5   Title  Pt will demo proper body mechanics at work, home, and fitness routine in order to minimzie worsening of prolapse     Time  12    Period  Weeks    Status  On-going      PT LONG TERM GOAL #6   Title  Pt will decrease R thoracic mm tensions and increase diaphragmatic excursion in order to improve spinal deviations 2/2 scoliosis    Time  4    Period  Weeks    Status  On-going      PT LONG TERM GOAL #7   Title  Pt will decrease her PSQI score from 43% to <33 % and be referred to a sleep study to screen  for OSA in order to improve sleep quality for overall wellness     Time  8    Period  Weeks    Status  On-going      PT LONG TERM GOAL #8   Title  Pt will report no "pinching" sensation at her groin occuring across 1 month in order to improve QOL    Time  10    Period  Weeks    Status  On-going      PT LONG TERM GOAL  #9   TITLE   Pt will report no SIJ issues across 1 month with compliance of scolios-specific HEP in order to better manage her scoliosis    Time  4    Period  Weeks    Status  On-going      PT LONG TERM GOAL  #10   TITLE  Pt will report improvemetn of her stool consistency from Type 1 to Type 4 across 75% of the time) in order to improve GI function and to minimize straining for better positioning of her bladder    Time  12    Period  Weeks    Status  On-going      PT LONG TERM GOAL  #11   TITLE  Pt will report no more numbness/ tingling in L arm and no pain with "w" band exercise with 30 reps in order to demo IND with stretches to minimize repeated L mm strain/ tightenss from movements at workstation.     Time  4    Status  New    Target Date  12/23/17            Plan - 11/25/17 7782    Clinical Impression Statement  Pt showed no more L scapular dyskinesis, L levator mm tightness, hypomobility T7-10 segments, centralization of peripheral pain along C7-T1 dermatome on L UE post Tx today. Educated pt on complimentary stretches to minimize increased L shoulder/ neck tightenss from turning L repeated at workstation.  Anticipate this Tx will allow for pt to return to resistance throacolumbar strengthening exercise with less L shoulder/neck pain. Pt continues to benefit from skilled PT.     Rehab Potential  Good    PT Frequency  1x / week    PT Duration  12 weeks    PT Treatment/Interventions  ADLs/Self Care Home Management;Electrical Stimulation;Cryotherapy;Ultrasound;Traction;Moist Heat;Iontophoresis 65m/ml Dexamethasone;Functional mobility training;Therapeutic activities;Therapeutic exercise;Patient/family education;Neuromuscular re-education;Manual techniques;Scar mobilization;Passive range of motion;Visual/perceptual remediation/compensation;Taping;Splinting;Energy conservation;Dry needling;Biofeedback;Aquatic Therapy;Manual lymph drainage;Balance training;Stair training    Consulted and Agree with  Plan of Care  Patient       Patient will benefit from skilled therapeutic intervention in order to improve the following deficits and impairments:  Abnormal gait, Decreased activity tolerance, Decreased coordination, Decreased range of motion, Decreased strength, Hypomobility, Impaired flexibility, Impaired perceived functional ability, Impaired UE functional use, Postural dysfunction, Improper body mechanics, Pain, Decreased mobility, Decreased scar mobility, Increased muscle spasms  Visit Diagnosis: Other idiopathic scoliosis, thoracolumbar region  Unequal leg length  Other lack of coordination  Abnormal posture  Acute pain of right shoulder  Stiffness of right shoulder, not elsewhere classified     Problem List Patient Active Problem List   Diagnosis Date Noted  . Hypertriglyceridemia 05/17/2017  . Perimenopausal 02/13/2016  . 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 Numbness of right foot 11/29/2015  . Elevated hemoglobin A1c 11/29/2015  . Osteopenia 11/29/2015  . Vitamin D insufficiency 11/29/2015  . Palpitations   . Recurrent major  depression in full remission ([place])   . Deafness in left ear   . Anxiety 04/08/2013  . Benign essential tremor 04/08/2013  . Carpal tunnel syndrome 04/08/2013    Jerl Mina ,PT, DPT, E-RYT  11/25/2017, 9:12 AM  [place] MAIN [facility] SERVICES [address] [place], [place], [address] Phone: [phone]   Fax:  [phone]  Name: Carolyn [name] MRN: [id number] Date of Birth: 07-Mar-1975

## 2017-11-25 NOTE — Patient Instructions (Addendum)
"  Ear muff"  Ulnar nerve glides: to minimize numbness and tingle in L arm and hand    place palm ( fingers tip downwards) as "ear muff"   lowering hand down to "pet a tall dog" finger tips at 7 oclock, shoulder pulled down)    10 reps  At every hour   ____  Wall stretch  R temple on the wall, R palm at 5 oclock   "smell R armpit" and "look NW direction at ceiling " while keep contact with R temple on the wall   10 reps x 3 day during work day to compliment the repeated looking L when patients sit in front of you   ____    Make sure to loosen up with 6 directions or the above exercises    L shoulder prior to the band "w" exercise at home  , work up to 10 first, then ghradually to 30 reps when no pain and no numbness and tingling in arm

## 2017-11-28 DIAGNOSIS — G250 Essential tremor: Secondary | ICD-10-CM | POA: Diagnosis not present

## 2017-12-02 ENCOUNTER — Ambulatory Visit: Payer: 59 | Admitting: Physical Therapy

## 2017-12-02 DIAGNOSIS — M25511 Pain in right shoulder: Secondary | ICD-10-CM

## 2017-12-02 DIAGNOSIS — M25611 Stiffness of right shoulder, not elsewhere classified: Secondary | ICD-10-CM | POA: Diagnosis not present

## 2017-12-02 DIAGNOSIS — M2170 Unequal limb length (acquired), unspecified site: Secondary | ICD-10-CM | POA: Diagnosis not present

## 2017-12-02 DIAGNOSIS — R293 Abnormal posture: Secondary | ICD-10-CM

## 2017-12-02 DIAGNOSIS — M4125 Other idiopathic scoliosis, thoracolumbar region: Secondary | ICD-10-CM

## 2017-12-02 DIAGNOSIS — R278 Other lack of coordination: Secondary | ICD-10-CM

## 2017-12-02 DIAGNOSIS — M791 Myalgia, unspecified site: Secondary | ICD-10-CM | POA: Diagnosis not present

## 2017-12-02 NOTE — Therapy (Signed)
[place] MAIN [facility] SERVICES [address] [place], [place], [address] Phone: [phone]   Fax:  [phone]  Physical Therapy Treatment  Patient Details  Name: Carolyn [name] MRN: [id number] Date of Birth: [birth year] Referring Provider (PT): Zara Council   Encounter Date: 12/02/2017  PT End of Session - 12/02/17 0910    Visit Number  9    Number of Visits  12    Date for PT Re-Evaluation  12/24/17    PT Start Time  0815    PT Stop Time  0910    PT Time Calculation (min)  55 min    Activity Tolerance  Patient tolerated treatment well;No increased pain    Behavior During Therapy  WFL for tasks assessed/performed       Past Medical History:  Diagnosis Date  . Anemia    a. low-normal H/H  . Anxiety   . Arthritis    osteopenia  . Benign essential tremor    a. bilateral upper and lower extremities; b. followed by [facility]; c. felt to be 2/2 antidepressants   . Carpal tunnel syndrome   . Deafness in left ear   . Palpitations     Past Surgical History:  Procedure Laterality Date  . axillary lymph node removal Right 1982   enlarged lymph node  . IUD REMOVAL N/A 04/23/2014   Procedure: INTRAUTERINE DEVICE (IUD) REMOVAL;  Surgeon: Everlene Farrier, MD;  Location: [facility];  Service: Gynecology;  Laterality: N/A;  . LAPAROSCOPIC TUBAL LIGATION Bilateral 04/23/2014   Procedure: LAPAROSCOPIC TUBAL LIGATION with Filshie Clips;  Surgeon: Everlene Farrier, MD;  Location: [facility];  Service: Gynecology;  Laterality: Bilateral;    There were no vitals filed for this visit.  Subjective Assessment - 12/02/17 0903    Subjective  Pt reported neck pain after receiving the massage at chriporactic office.  After massage , the pain was 6/10 . Today the pain 3/10.     Pertinent History  Perimenopausal, osteopenia, anxiety and depression are being managed with medication, Gyn Hx: tubal ligation, episiotomy with first and only child, Physical activity: "very little".  Pt prefers to exercise with other people. In the past, pt used to go to the gym ( elliptical for 5K).  Pt has not pursued weight lifting since college. Pt used to ride bicycle for 20 mi rides. Family used to also do kayaking.  Pt would like to start bicycle.     Patient Stated Goals  Address all issues above.  Return to 20 mi bike riding .  Wean off Myrbetriq, sleeping meds while working with her psychiatrist and doctors          [facility] PT Assessment - 12/02/17 0001      Coordination   Gross Motor Movements are Fluid and Coordinated  --   excessive cues for upper trap depression   Fine Motor Movements are Fluid and Coordinated  --   cued less lumbar lordosis w/ scap +cerv depression/retract     Palpation   Spinal mobility  C7 deviated to the R, hypomobility T1-7 , R levator / scalenes      Bed Mobility   Bed Mobility  --   crunch method, cued for log rolling                  [facility] Adult PT Treatment/Exercise - 12/02/17 0001      Neuro Re-ed    Neuro Re-ed Details   see pt instructions  Moist Heat Therapy   Number Minutes Moist Heat  5 Minutes    Moist Heat Location  Shoulder;Other (comment)   thoracic     Manual Therapy   Manual therapy comments  distraction at occiput, rotational MWM with PA mob Grade III at T7-10, STM at levator with neural glides (ulnar)                PT Short Term Goals - 11/28/16 0810      PT SHORT TERM GOAL #1   Title  Patient will be independent in home exercise program to improve strength/mobility for better functional independence with ADLs.    Baseline  11/21: independent    Time  2    Period  Weeks    Status  Achieved      PT SHORT TERM GOAL #2   Title  Patient will be able to perform household work/ chores without increase in symptoms.    Baseline  pain occasionally increases to 2/10    Time  2    Period  Weeks    Status  Partially Met        PT Long Term Goals - 11/25/17 0910      PT LONG TERM GOAL #1    Title  Pt will decrease her NIH-CPSI score from 19% to < 9% in order to improve pelvic floor function    Time  12    Period  Weeks    Status  On-going      PT LONG TERM GOAL #2   Title  Pt will decrease her COREFO score from 22% to < 12% in order to improve bowel function and have more regular bowel movements    Time  8    Period  Weeks    Status  On-going      PT LONG TERM GOAL #3   Title  Pt will demo equal iliac crest alignment across 2 visits with compliance of shoe lift wear in order to progress to pelvic floor coordiantion and strengthening    Time  4    Period  Weeks    Status  On-going      PT LONG TERM GOAL #4   Title  Pt will demo proper deep core coordination, proer pelvic floor coordination without abdominal overuse in order to improve intraabdominal pressure to minimize pelvic floor dysfunctions    Time  8    Period  Weeks    Status  On-going      PT LONG TERM GOAL #5   Title  Pt will demo proper body mechanics at work, home, and fitness routine in order to minimzie worsening of prolapse     Time  12    Period  Weeks    Status  On-going      PT LONG TERM GOAL #6   Title  Pt will decrease R thoracic mm tensions and increase diaphragmatic excursion in order to improve spinal deviations 2/2 scoliosis    Time  4    Period  Weeks    Status  On-going      PT LONG TERM GOAL #7   Title  Pt will decrease her PSQI score from 43% to <33 % and be referred to a sleep study to screen for OSA in order to improve sleep quality for overall wellness     Time  8    Period  Weeks    Status  On-going      PT LONG TERM GOAL #  8   Title  Pt will report no "pinching" sensation at her groin occuring across 1 month in order to improve QOL    Time  10    Period  Weeks    Status  On-going      PT LONG TERM GOAL  #9   TITLE  Pt will report no SIJ issues across 1 month with compliance of scolios-specific HEP in order to better manage her scoliosis    Time  4    Period  Weeks     Status  On-going      PT LONG TERM GOAL  #10   TITLE  Pt will report improvemetn of her stool consistency from Type 1 to Type 4 across 75% of the time) in order to improve GI function and to minimize straining for better positioning of her bladder    Time  12    Period  Weeks    Status  On-going      PT LONG TERM GOAL  #11   TITLE  Pt will report no more numbness/ tingling in L arm and no pain with "w" band exercise with 30 reps in order to demo IND with stretches to minimize repeated L mm strain/ tightenss from movements at workstation.     Time  4    Status  New    Target Date  12/23/17            Plan - 12/02/17 4235    Clinical Impression Statement  Following Tx today, pt reported less neck pain. Pt is making great progress with decreased mm tightness and is able to demo improved less upper trap dominance, more neuromuscular control with scapular/cervical depression/retraction and propioception of neck in upright position with co-activation of deep core and lower kinetic chain. Plan to address rotator cuff complaints. Added body mechanics and new HEP. Withhold one band exercise due to overuse of upper trap. Pt continues benefit from skilled PT.      Rehab Potential  Good    PT Frequency  1x / week    PT Duration  12 weeks    PT Treatment/Interventions  ADLs/Self Care Home Management;Electrical Stimulation;Cryotherapy;Ultrasound;Traction;Moist Heat;Iontophoresis 104m/ml Dexamethasone;Functional mobility training;Therapeutic activities;Therapeutic exercise;Patient/family education;Neuromuscular re-education;Manual techniques;Scar mobilization;Passive range of motion;Visual/perceptual remediation/compensation;Taping;Splinting;Energy conservation;Dry needling;Biofeedback;Aquatic Therapy;Manual lymph drainage;Balance training;Stair training    Consulted and Agree with Plan of Care  Patient       Patient will benefit from skilled therapeutic intervention in order to improve the following  deficits and impairments:  Abnormal gait, Decreased activity tolerance, Decreased coordination, Decreased range of motion, Decreased strength, Hypomobility, Impaired flexibility, Impaired perceived functional ability, Impaired UE functional use, Postural dysfunction, Improper body mechanics, Pain, Decreased mobility, Decreased scar mobility, Increased muscle spasms  Visit Diagnosis: Other idiopathic scoliosis, thoracolumbar region  Unequal leg length  Other lack of coordination  Abnormal posture  Acute pain of right shoulder  Stiffness of right shoulder, not elsewhere classified     Problem List Patient Active Problem List   Diagnosis Date Noted  . Hypertriglyceridemia 05/17/2017  . Perimenopausal 02/13/2016  . 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 Numbness of right foot 11/29/2015  . Elevated hemoglobin A1c 11/29/2015  . Osteopenia 11/29/2015  . Vitamin D insufficiency 11/29/2015  . Palpitations   . Recurrent major depression in full remission ([facility]   . Deafness in left ear   . Anxiety 04/08/2013  . Benign essential tremor 04/08/2013  . Carpal tunnel syndrome 04/08/2013    YTalmadge Chad  Lanora Manis, DPT, E-RYT  12/02/2017, 9:42 AM  [place] MAIN [facility] SERVICES [address] [place], [place], [address] Phone: [phone]   Fax:  [phone]  Name: LYBERTI THRUSH MRN: [id number] Date of Birth: 05-06-1975

## 2017-12-02 NOTE — Patient Instructions (Addendum)
propioception of j-scoop neck and shoudlers down   Move mouse at workstation back   ___  [facility] HospitalWithhold "w" exercsie   Stretches:   "[place]" towel stretch ( cow face yoga pose for the arms"  - chin tuck, press into legs and feet, deep core engaged no ribs flaring out.  10 reps    Marjo BickerChilds pose rocking: press 4 corners of palms down, widen pam placement a little"v" , elbows unlocked  Less arch in the back,  10 reps  Lengthening shoulders from ears    Mini low cobras:  Toes turned each other palms under armpit, elbows by ribs (like cricket wings),  imaginary pencil held in armpit,   Inhale,  Exhale press through palms to bed/floor and small lift of chest only not the chin. You will feel the bend in the midback not your low back   5 reps    Use yellow band for the "hulk hogan exercise" 10 reps   ___   Avoid straining pelvic floor, abdominal muscles , spine  Use log rolling technique instead of getting out of bed with your neck or the sit-up   Log rolling into and out of .bed  With sidelying position first

## 2017-12-04 DIAGNOSIS — F322 Major depressive disorder, single episode, severe without psychotic features: Secondary | ICD-10-CM | POA: Diagnosis not present

## 2017-12-10 DIAGNOSIS — F322 Major depressive disorder, single episode, severe without psychotic features: Secondary | ICD-10-CM | POA: Diagnosis not present

## 2017-12-13 DIAGNOSIS — F322 Major depressive disorder, single episode, severe without psychotic features: Secondary | ICD-10-CM | POA: Diagnosis not present

## 2017-12-17 ENCOUNTER — Encounter: Payer: 59 | Admitting: Physical Therapy

## 2017-12-19 ENCOUNTER — Ambulatory Visit: Payer: 59 | Attending: Urology | Admitting: Physical Therapy

## 2017-12-19 DIAGNOSIS — M25511 Pain in right shoulder: Secondary | ICD-10-CM | POA: Insufficient documentation

## 2017-12-19 DIAGNOSIS — R293 Abnormal posture: Secondary | ICD-10-CM | POA: Diagnosis not present

## 2017-12-19 DIAGNOSIS — M2170 Unequal limb length (acquired), unspecified site: Secondary | ICD-10-CM | POA: Diagnosis not present

## 2017-12-19 DIAGNOSIS — M25611 Stiffness of right shoulder, not elsewhere classified: Secondary | ICD-10-CM | POA: Insufficient documentation

## 2017-12-19 DIAGNOSIS — M4125 Other idiopathic scoliosis, thoracolumbar region: Secondary | ICD-10-CM | POA: Insufficient documentation

## 2017-12-19 DIAGNOSIS — R278 Other lack of coordination: Secondary | ICD-10-CM | POA: Insufficient documentation

## 2017-12-19 NOTE — Therapy (Signed)
[facility] [address] [place], [place], [address] Phone: [phone]   Fax:  [phone]  Physical Therapy Treatment Michael Litter/Progress Note   Patient Details  Name: Carolyn [name] MRN: [id number] Date of Birth: 11/02/1975 Referring Provider (PT): Michiel CowboyShannon McGowan   Encounter Date: 12/19/2017  PT End of Session - 12/19/17 0851    Visit Number  10    Number of Visits  12    Date for PT Re-Evaluation  03/13/18    Authorization Type  recert submitted 12/12    PT Start Time  0810    PT Stop Time  0855    PT Time Calculation (min)  45 min    Activity Tolerance  Patient tolerated treatment well;No increased pain    Behavior During Therapy  WFL for tasks assessed/performed       Past Medical History:  Diagnosis Date  . Anemia    a. low-normal H/H  . Anxiety   . Arthritis    osteopenia  . Benign essential tremor    a. bilateral upper and lower extremities; b. [facility]; c. felt to be 2/2 antidepressants   . Carpal tunnel syndrome   . Deafness in left ear   . Palpitations     Past Surgical History:  Procedure Laterality Date  . axillary lymph node removal Right 1982   enlarged lymph node  . IUD REMOVAL N/A 04/23/2014   Procedure: INTRAUTERINE DEVICE (IUD) REMOVAL;  Surgeon: Harold HedgeJames Tomblin, MD;  Location: [facility];  Service: Gynecology;  Laterality: N/A;  . LAPAROSCOPIC TUBAL LIGATION Bilateral 04/23/2014   Procedure: LAPAROSCOPIC TUBAL LIGATION with Filshie Clips;  Surgeon: Harold HedgeJames Tomblin, MD;  Location: [facility];  Service: Gynecology;  Laterality: Bilateral;    There were no vitals filed for this visit.  Subjective Assessment - 12/19/17 0807    Subjective  Pt reports her neck only feel tight and is hard to turn R completely.     Pertinent History  Perimenopausal, osteopenia, anxiety and depression are being managed with medication, Gyn Hx: tubal ligation, episiotomy with first and only child, Physical activity: "very  little". Pt prefers to exercise with other people. In the past, pt used to go to the gym ( elliptical for 5K).  Pt has not pursued weight lifting since college. Pt used to ride bicycle for 20 mi rides. Family used to also do kayaking.  Pt would like to start bicycle.     Patient Stated Goals  Address all issues above.  Return to 20 mi bike riding .  Wean off Myrbetriq, sleeping meds while working with her psychiatrist and doctors          [facility] Assessment - 12/19/17 0810      Coordination   Gross Motor Movements are Fluid and Coordinated  --   excessive cues for upper trap depression   Fine Motor Movements are Fluid and Coordinated  --   cued less lumbar lordosis w/ scap +cerv depression/retract     AROM   Overall AROM   --   R 40deg rotate,L 60 deg,ext 30deg,flex 55,R SB 45 , L 35 dw    Overall AROM Comments  Post Tx:  B SB 45deg,  ext 60 deg, flex 40 deg, R rotation 45 deg, L 50 deg       Palpation   Spinal mobility  C7 deviated to the R, hypomobility T1-7 , R levator / scalenes    Palpation comment  L iliocostalis  tightness , hypomoble T3 with R deviated       Bed Mobility   Bed Mobility  --   crunch method, cued for log rolling                  [facility] Adult PT Treatment/Exercise - 12/19/17 0810      Neuro Re-ed    Neuro Re-ed Details   see pt instructions       Moist Heat Therapy   Number Minutes Moist Heat  5 Minutes      Manual Therapy   Manual therapy comments  distraction at C/T,  STM at iliocostalis L, medial glide of T3                   PT Long Term Goals - 12/19/17 0851      PT LONG TERM GOAL #1   Title  Pt will decrease her NIH-CPSI score from 19% to < 9% in order to improve pelvic floor function  ( 12/12: 23%)     Time  12    Period  Weeks    Status  On-going      PT LONG TERM GOAL #2   Title  Pt will decrease her COREFO score from 22% to < 12% in order to improve bowel function and have more regular bowel movements  ( 12/12:  20% )     Time  8    Period  Weeks    Status  On-going      PT LONG TERM GOAL #3   Title  Pt will demo equal iliac crest alignment across 2 visits with compliance of shoe lift wear in order to progress to pelvic floor coordiantion and strengthening    Time  4    Period  Weeks    Status  Achieved      PT LONG TERM GOAL #4   Title  Pt will demo proper deep core coordination, proer pelvic floor coordination without abdominal overuse in order to improve intraabdominal pressure to minimize pelvic floor dysfunctions    Time  8    Period  Weeks    Status  Achieved      PT LONG TERM GOAL #5   Title  Pt will demo proper body mechanics at work, home, and fitness routine in order to minimzie worsening of prolapse     Time  12    Period  Weeks    Status  On-going      PT LONG TERM GOAL #6   Title  Pt will decrease R thoracic mm tensions and increase diaphragmatic excursion in order to improve spinal deviations 2/2 scoliosis    Time  4    Period  Weeks    Status  Achieved      PT LONG TERM GOAL #7   Title  Pt will decrease her PSQI score from 43% to <33 % and be referred to a sleep study to screen for OSA in order to improve sleep quality for overall wellness  ( 12/12: 33%)      Time  8    Period  Weeks    Status  Achieved      PT LONG TERM GOAL #8   Title  Pt will report no "pinching" sensation at her groin occuring across 1 month in order to improve QOL    Time  10    Period  Weeks    Status  Achieved      PT LONG  TERM GOAL  #9   TITLE  Pt will report no SIJ issues across 1 month with compliance of scolios-specific HEP in order to better manage her scoliosis    Time  4    Period  Weeks    Status  Achieved      PT LONG TERM GOAL  #10   TITLE  Pt will report improvemetn of her stool consistency from Type 1 to Type 4 across 75% of the time) in order to improve GI function and to minimize straining for better positioning of her bladder    Time  12    Period  Weeks    Status   Achieved      PT LONG TERM GOAL  #11   TITLE  Pt will report no more numbness/ tingling in L arm and no pain with "w" band exercise with 30 reps in order to demo IND with stretches to minimize repeated L mm strain/ tightenss from movements at workstation.     Time  4    Status  On-going            Plan - 12/19/17 0859    Clinical Impression Statement Pt has 7/11 goals since [facility]. Pt's urinary Sx and stool consistency have improved signficantly ( no longer deviated stream, less frequency, change in stool type 1 to 4 across 75% of the time). Pt has no more L groin pain and pelvic obliquities as her scoliosis was getting addressed with manual Tx. Pt's bladder position is more cranially position and she demo'd proper deep core coordination.   Today, focused on neck/ shoulder issues with manual Tx which improved AROM. Addressed complimentary stretches to counter act repeated L rotation that is performed at her work. Pt demo'd less forward head and no more scapular dyskinesis.  Plan to assess whether mild scoliosis will improve with adjustments made to counteract repeated one-sided motions that she has performed at work. Pt has made ergonomic adjustments as recommended.    Pt benefit from skilled PT to addressing remaining goals.    Rehab Potential  Good    PT Frequency  1x / week    PT Duration  12 weeks    PT Treatment/Interventions  ADLs/Self Care Home Management;Electrical Stimulation;Cryotherapy;Ultrasound;Traction;Moist Heat;Iontophoresis 4mg /ml Dexamethasone;Functional mobility training;Therapeutic activities;Therapeutic exercise;Patient/family education;Neuromuscular re-education;Manual techniques;Scar mobilization;Passive range of motion;Visual/perceptual remediation/compensation;Taping;Splinting;Energy conservation;Dry needling;Biofeedback;Aquatic Therapy;Manual lymph drainage;Balance training;Stair training    Consulted and Agree with Plan of Care  Patient       Patient will benefit  from skilled therapeutic intervention in order to improve the following deficits and impairments:  Abnormal gait, Decreased activity tolerance, Decreased coordination, Decreased range of motion, Decreased strength, Hypomobility, Impaired flexibility, Impaired perceived functional ability, Impaired UE functional use, Postural dysfunction, Improper body mechanics, Pain, Decreased mobility, Decreased scar mobility, Increased muscle spasms  Visit Diagnosis: Other idiopathic scoliosis, thoracolumbar region  Unequal leg length  Other lack of coordination  Abnormal posture  Acute pain of right shoulder  Stiffness of right shoulder, not elsewhere classified     Problem List Patient Active Problem List   Diagnosis Date Noted  . Hypertriglyceridemia 05/17/2017  . Perimenopausal 02/13/2016  . 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 Numbness of right foot 11/29/2015  . Elevated hemoglobin A1c 11/29/2015  . Osteopenia 11/29/2015  . Vitamin D insufficiency 11/29/2015  . Palpitations   . Recurrent major depression in full remission (HCC)   . Deafness in left ear   . Anxiety 04/08/2013  . Benign  essential tremor 04/08/2013  . Carpal tunnel syndrome 04/08/2013    Mariane Masters ,PT, DPT, E-RYT  12/19/2017, 10:31 AM  [place] [facility] MAIN [facility] SERVICES [address] [place], [place], [address] Phone: [phone]   Fax:  [phone]  Name: Carolyn [name] MRN: [id number] Date of Birth: Jun 03, 1975

## 2017-12-19 NOTE — Patient Instructions (Signed)
To release midback pain/ muscle tensions:  Yoga block (2") behind midback longways , a block under head   Reposition into lengthen spine , pelvic neutral  Allow knees to move ~10-15 deg so to allow your body weight to hang over edge of the block and back to center, then the other side  10 min here.  Or at work: bear back Quarry managersratch at the doorway [employer]( minisquat) relax back of the head    ______     Side of hip stretch:  Reclined twist for hips and side of the hips/ legs  Lay on your back, knees bend Scoot hips to the R , leave shoulders in place Drop knees to the L side resting onto pillows to keep leg at the same width of hips Pillow under L thigh to minimize too much strain    _____  Stretch the R neck,  draw shoulder down and back then rotate R  10 reps at home  At work, seated , hands down on chair to push shoulder down   ______  Focus on stretches, deep core level 2, shoulder stabilizing (low cobra)

## 2017-12-20 DIAGNOSIS — F900 Attention-deficit hyperactivity disorder, predominantly inattentive type: Secondary | ICD-10-CM | POA: Diagnosis not present

## 2017-12-20 DIAGNOSIS — F410 Panic disorder [episodic paroxysmal anxiety] without agoraphobia: Secondary | ICD-10-CM | POA: Diagnosis not present

## 2017-12-20 DIAGNOSIS — F332 Major depressive disorder, recurrent severe without psychotic features: Secondary | ICD-10-CM | POA: Diagnosis not present

## 2017-12-23 DIAGNOSIS — F322 Major depressive disorder, single episode, severe without psychotic features: Secondary | ICD-10-CM | POA: Diagnosis not present

## 2018-01-06 ENCOUNTER — Ambulatory Visit: Payer: 59 | Admitting: Physical Therapy

## 2018-01-06 DIAGNOSIS — M25611 Stiffness of right shoulder, not elsewhere classified: Secondary | ICD-10-CM | POA: Diagnosis not present

## 2018-01-06 DIAGNOSIS — M25511 Pain in right shoulder: Secondary | ICD-10-CM

## 2018-01-06 DIAGNOSIS — R293 Abnormal posture: Secondary | ICD-10-CM

## 2018-01-06 DIAGNOSIS — R278 Other lack of coordination: Secondary | ICD-10-CM

## 2018-01-06 DIAGNOSIS — M2170 Unequal limb length (acquired), unspecified site: Secondary | ICD-10-CM | POA: Diagnosis not present

## 2018-01-06 DIAGNOSIS — M4125 Other idiopathic scoliosis, thoracolumbar region: Secondary | ICD-10-CM | POA: Diagnosis not present

## 2018-01-06 NOTE — Patient Instructions (Signed)
Bridging series w/ resistive band other side of doorknob:  Level 1:  Position:  Elbows bent, knees hip width apart, heels under knees on top of stable  foot stool   Stabilization points: shoulders, upper arms, back of head pressed into floor. Heel press downward.   Movement: inhale do nothing, exhale pull band by side, lower fists to floor completely while lifting hips.Keep stabilization points engaged when you allow the band to go back to starting position  10 x 2 reps       Level 2:  Position:  Elbows straight, arms raised to ceiling at shoulder height, knees apart like a ballerina,heels together, heels under knees, on top of stable  foot stool   Stabilization points: shoulders, upper arms, back of head pressed into floor. Heel press downward.   Movement: inhale do nothing, exhale pull band by side, lower fists to floor completely while lifting hips. Keep stabilization points engaged when you allow the band to go back to starting position   10 x 2 reps  Shoulder training: Try to imagine you are squeezing a pencil under your armpit and your shoulder blades are down away from your ears and towards each other      

## 2018-01-06 NOTE — Therapy (Signed)
[facility] MAIN [facility] SERVICES [address] [place], [place], [address] Phone: [phone]   Fax:  [phone]  Physical Therapy Treatment  Patient Details  Name: ONEIKA SIMONIAN MRN: [id number] Date of Birth: 1975-11-26 Referring Provider (PT): Michiel Cowboy   Encounter Date: 01/06/2018  PT End of Session - 01/06/18 0850    Visit Number  11    Date for PT Re-Evaluation  03/13/18    Authorization Type  recert submitted 12/19/17    Activity Tolerance  Patient tolerated treatment well;No increased pain    Behavior During Therapy  WFL for tasks assessed/performed       Past Medical History:  Diagnosis Date  . Anemia    a. low-normal H/H  . Anxiety   . Arthritis    osteopenia  . Benign essential tremor    a. bilateral upper and lower extremities; b. followed by [facility]; c. felt to be 2/2 antidepressants   . Carpal tunnel syndrome   . Deafness in left ear   . Palpitations     Past Surgical History:  Procedure Laterality Date  . axillary lymph node removal Right 1982   enlarged lymph node  . IUD REMOVAL N/A 04/23/2014   Procedure: INTRAUTERINE DEVICE (IUD) REMOVAL;  Surgeon: Harold Hedge, MD;  Location: [facility];  Service: Gynecology;  Laterality: N/A;  . LAPAROSCOPIC TUBAL LIGATION Bilateral 04/23/2014   Procedure: LAPAROSCOPIC TUBAL LIGATION with Filshie Clips;  Surgeon: Harold Hedge, MD;  Location: [facility];  Service: Gynecology;  Laterality: Bilateral;    There were no vitals filed for this visit.  Subjective Assessment - 01/06/18 0816    Subjective  Pt reports urinary Sx are still good and her neck has improved by 70% . The pain is now only located at the upper neck. Pt reports her numbness in her hands has been less. Pt notices her L shoulder joint hurts when driving her stick shift car.    Pertinent History  Perimenopausal, osteopenia, anxiety and depression are being managed with medication, Gyn Hx: tubal ligation, episiotomy with  first and only child, Physical activity: "very little". Pt prefers to exercise with other people. In the past, pt used to go to the gym ( elliptical for 5K).  Pt has not pursued weight lifting since college. Pt used to ride bicycle for 20 mi rides. Family used to also do kayaking.  Pt would like to start bicycle.     Patient Stated Goals  Address all issues above.  Return to 20 mi bike riding .  Wean off Myrbetriq, sleeping meds while working with her psychiatrist and doctors          [facility] PT Assessment - 01/06/18 0818      Observation/Other Assessments   Observations  simulated L arm position and car seat ergonomics      Observation/Other Assessments-Edema    Edema  --   slight forward head      Palpation   Spinal mobility  L T1-2 interspinals, L posterior scalene, lower trap increased tightness ( post Tx decreased)     SI assessment   levelled                    [facility] Adult PT Treatment/Exercise - 01/06/18 0847      Neuro Re-ed    Neuro Re-ed Details   see pt instructions       Moist Heat Therapy   Number Minutes Moist Heat  5 Minutes  Moist Heat Location  Cervical   thoracic      Manual Therapy   Manual therapy comments  STM/ MWM at problem areas noted in assessment    inferior mobs at 1st rib Grade III                  PT Long Term Goals - 12/19/17 0851      PT LONG TERM GOAL #1   Title  Pt will decrease her NIH-CPSI score from 19% to < 9% in order to improve pelvic floor function  ( 12/12: 23%)     Time  12    Period  Weeks    Status  On-going      PT LONG TERM GOAL #2   Title  Pt will decrease her COREFO score from 22% to < 12% in order to improve bowel function and have more regular bowel movements  ( 12/12: 20% )     Time  8    Period  Weeks    Status  On-going      PT LONG TERM GOAL #3   Title  Pt will demo equal iliac crest alignment across 2 visits with compliance of shoe lift wear in order to progress to pelvic floor  coordiantion and strengthening    Time  4    Period  Weeks    Status  Achieved      PT LONG TERM GOAL #4   Title  Pt will demo proper deep core coordination, proer pelvic floor coordination without abdominal overuse in order to improve intraabdominal pressure to minimize pelvic floor dysfunctions    Time  8    Period  Weeks    Status  Achieved      PT LONG TERM GOAL #5   Title  Pt will demo proper body mechanics at work, home, and fitness routine in order to minimzie worsening of prolapse     Time  12    Period  Weeks    Status  On-going      PT LONG TERM GOAL #6   Title  Pt will decrease R thoracic mm tensions and increase diaphragmatic excursion in order to improve spinal deviations 2/2 scoliosis    Time  4    Period  Weeks    Status  Achieved      PT LONG TERM GOAL #7   Title  Pt will decrease her PSQI score from 43% to <33 % and be referred to a sleep study to screen for OSA in order to improve sleep quality for overall wellness  ( 12/12: 33%)      Time  8    Period  Weeks    Status  Achieved      PT LONG TERM GOAL #8   Title  Pt will report no "pinching" sensation at her groin occuring across 1 month in order to improve QOL    Time  10    Period  Weeks    Status  Achieved      PT LONG TERM GOAL  #9   TITLE  Pt will report no SIJ issues across 1 month with compliance of scolios-specific HEP in order to better manage her scoliosis    Time  4    Period  Weeks    Status  Achieved      PT LONG TERM GOAL  #10   TITLE  Pt will report improvemetn of her stool consistency from Type 1 to Type 4  across 75% of the time) in order to improve GI function and to minimize straining for better positioning of her bladder    Time  12    Period  Weeks    Status  Achieved      PT LONG TERM GOAL  #11   TITLE  Pt will report no more numbness/ tingling in L arm and no pain with "w" band exercise with 30 reps in order to demo IND with stretches to minimize repeated L mm strain/ tightenss  from movements at workstation.     Time  4    Status  On-going            Plan - 01/06/18 0850    Clinical Impression Statement  Pt demo'd less cervical / thoracic mm tightness and no longer shows any leg length discrepancy/ pelvic girdle malalginment and is no longer wearing her shoe lift which demo good carry over from past sessions.  Pt reported 70% improvement with neck  issues and is having less numbness in her hands. Today,focused on decreasing L posterior scalenes mm, lower trap, interspinal mm tightness which is likely associated with the way her L arm is positioned on her steering wheel and leaning her back against her car seat in a reclined position. After manual Tx, pt demo'd proper depression of scapular and 1st rib with decreased mm tightness. Initiated thoracolumbar strengthening exercises which pt required moderate cues. 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 Pt continues to benefit from skilled PT     Rehab Potential  Good    PT Frequency  1x / week    PT Duration  12 weeks    PT Treatment/Interventions  ADLs/Self Care Home Management;Electrical Stimulation;Cryotherapy;Ultrasound;Traction;Moist Heat;Iontophoresis 4mg /ml Dexamethasone;Functional mobility training;Therapeutic activities;Therapeutic exercise;Patient/family education;Neuromuscular re-education;Manual techniques;Scar mobilization;Passive range of motion;Visual/perceptual remediation/compensation;Taping;Splinting;Energy conservation;Dry needling;Biofeedback;Aquatic Therapy;Manual lymph drainage;Balance training;Stair training    Consulted and Agree with Plan of Care  Patient       Patient will benefit from skilled therapeutic intervention in order to improve the following deficits and impairments:  Abnormal gait, Decreased activity tolerance, Decreased coordination, Decreased range of motion, Decreased strength, Hypomobility, Impaired  flexibility, Impaired perceived functional ability, Impaired UE functional use, Postural dysfunction, Improper body mechanics, Pain, Decreased mobility, Decreased scar mobility, Increased muscle spasms  Visit Diagnosis: Other lack of coordination  Abnormal posture  Acute pain of right shoulder  Stiffness of right shoulder, not elsewhere classified     Problem List Patient Active Problem List   Diagnosis Date Noted  . Hypertriglyceridemia 05/17/2017  . Perimenopausal 02/13/2016  . 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 Numbness of right foot 11/29/2015  . Elevated hemoglobin A1c 11/29/2015  . Osteopenia 11/29/2015  . Vitamin D insufficiency 11/29/2015  . Palpitations   . Recurrent major depression in full remission (HCC)   . Deafness in left ear   . Anxiety 04/08/2013  . Benign essential tremor 04/08/2013  . Carpal tunnel syndrome 04/08/2013    Mariane MastersYeung,Shin Yiing ,PT, DPT, E-RYT  01/06/2018, 9:00 AM  [facility] [address] [place], [place], [address] Phone: [phone]   Fax:  [phone]  Name: Avon Gullymy L Baltazar MRN: [id number] Date of Birth: 05/05/1975

## 2018-01-13 ENCOUNTER — Other Ambulatory Visit: Payer: 59

## 2018-01-13 ENCOUNTER — Other Ambulatory Visit: Payer: Self-pay | Admitting: Urology

## 2018-01-13 DIAGNOSIS — R300 Dysuria: Secondary | ICD-10-CM

## 2018-01-13 LAB — URINALYSIS, COMPLETE
Bilirubin, UA: NEGATIVE
Glucose, UA: NEGATIVE
KETONES UA: NEGATIVE
NITRITE UA: NEGATIVE
Protein, UA: NEGATIVE
SPEC GRAV UA: 1.015 (ref 1.005–1.030)
Urobilinogen, Ur: 0.2 mg/dL (ref 0.2–1.0)
pH, UA: 7.5 (ref 5.0–7.5)

## 2018-01-13 LAB — MICROSCOPIC EXAMINATION

## 2018-01-13 MED ORDER — SULFAMETHOXAZOLE-TRIMETHOPRIM 800-160 MG PO TABS: 1.0000 | ORAL_TABLET | Freq: Two times a day (BID) | ORAL | 0 refills | Status: DC

## 2018-01-13 NOTE — Progress Notes (Signed)
Rx for septra sent to pharmacy.

## 2018-01-13 NOTE — Progress Notes (Signed)
Patient complains of dysuria that started on Friday, 01/09/2018.  UA and urine culture orders are in chart.

## 2018-01-14 DIAGNOSIS — F322 Major depressive disorder, single episode, severe without psychotic features: Secondary | ICD-10-CM | POA: Diagnosis not present

## 2018-01-16 LAB — CULTURE, URINE COMPREHENSIVE

## 2018-01-17 ENCOUNTER — Encounter: Payer: Self-pay | Admitting: Urology

## 2018-01-17 DIAGNOSIS — F322 Major depressive disorder, single episode, severe without psychotic features: Secondary | ICD-10-CM | POA: Diagnosis not present

## 2018-01-20 ENCOUNTER — Ambulatory Visit: Payer: 59 | Admitting: Physical Therapy

## 2018-01-24 DIAGNOSIS — F322 Major depressive disorder, single episode, severe without psychotic features: Secondary | ICD-10-CM | POA: Diagnosis not present

## 2018-01-29 DIAGNOSIS — F322 Major depressive disorder, single episode, severe without psychotic features: Secondary | ICD-10-CM | POA: Diagnosis not present

## 2018-02-04 ENCOUNTER — Ambulatory Visit: Payer: 59 | Admitting: Physical Therapy

## 2018-02-07 DIAGNOSIS — F322 Major depressive disorder, single episode, severe without psychotic features: Secondary | ICD-10-CM | POA: Diagnosis not present

## 2018-02-17 ENCOUNTER — Encounter: Payer: Self-pay | Admitting: Physical Therapy

## 2018-02-21 DIAGNOSIS — F322 Major depressive disorder, single episode, severe without psychotic features: Secondary | ICD-10-CM | POA: Diagnosis not present

## 2018-03-04 ENCOUNTER — Encounter: Payer: Self-pay | Admitting: Physical Therapy

## 2018-03-05 DIAGNOSIS — F322 Major depressive disorder, single episode, severe without psychotic features: Secondary | ICD-10-CM | POA: Diagnosis not present

## 2018-03-12 DIAGNOSIS — F322 Major depressive disorder, single episode, severe without psychotic features: Secondary | ICD-10-CM | POA: Diagnosis not present

## 2018-03-14 DIAGNOSIS — F410 Panic disorder [episodic paroxysmal anxiety] without agoraphobia: Secondary | ICD-10-CM | POA: Diagnosis not present

## 2018-03-14 DIAGNOSIS — F900 Attention-deficit hyperactivity disorder, predominantly inattentive type: Secondary | ICD-10-CM | POA: Diagnosis not present

## 2018-03-14 DIAGNOSIS — F332 Major depressive disorder, recurrent severe without psychotic features: Secondary | ICD-10-CM | POA: Diagnosis not present

## 2018-03-18 DIAGNOSIS — F322 Major depressive disorder, single episode, severe without psychotic features: Secondary | ICD-10-CM | POA: Diagnosis not present

## 2018-03-25 DIAGNOSIS — F322 Major depressive disorder, single episode, severe without psychotic features: Secondary | ICD-10-CM | POA: Diagnosis not present

## 2018-03-31 DIAGNOSIS — F900 Attention-deficit hyperactivity disorder, predominantly inattentive type: Secondary | ICD-10-CM | POA: Diagnosis not present

## 2018-03-31 DIAGNOSIS — F332 Major depressive disorder, recurrent severe without psychotic features: Secondary | ICD-10-CM | POA: Diagnosis not present

## 2018-03-31 DIAGNOSIS — F410 Panic disorder [episodic paroxysmal anxiety] without agoraphobia: Secondary | ICD-10-CM | POA: Diagnosis not present

## 2018-04-09 DIAGNOSIS — F322 Major depressive disorder, single episode, severe without psychotic features: Secondary | ICD-10-CM | POA: Diagnosis not present

## 2018-04-15 DIAGNOSIS — F322 Major depressive disorder, single episode, severe without psychotic features: Secondary | ICD-10-CM | POA: Diagnosis not present

## 2018-05-02 DIAGNOSIS — F410 Panic disorder [episodic paroxysmal anxiety] without agoraphobia: Secondary | ICD-10-CM | POA: Diagnosis not present

## 2018-05-02 DIAGNOSIS — F900 Attention-deficit hyperactivity disorder, predominantly inattentive type: Secondary | ICD-10-CM | POA: Diagnosis not present

## 2018-05-02 DIAGNOSIS — F332 Major depressive disorder, recurrent severe without psychotic features: Secondary | ICD-10-CM | POA: Diagnosis not present

## 2018-05-02 MED FILL — traZODone HCL 100 MG TABS: 100 | 90 days supply | Qty: 180 | Fill #0

## 2018-05-02 MED FILL — REXULTI 3 MG TABLET: 3 | 30 days supply | Qty: 30 | Fill #0 | Status: TO

## 2018-05-05 MED FILL — MYRBETRIQ ER 25 MG TABLET: 25 | 30 days supply | Qty: 30 | Fill #0 | Status: TO

## 2018-05-05 MED FILL — PRIMIDONE 50 MG TABLET: 50 | 90 days supply | Qty: 270 | Fill #0 | Status: TO

## 2018-05-06 DIAGNOSIS — F322 Major depressive disorder, single episode, severe without psychotic features: Secondary | ICD-10-CM | POA: Diagnosis not present

## 2018-05-20 DIAGNOSIS — F322 Major depressive disorder, single episode, severe without psychotic features: Secondary | ICD-10-CM | POA: Diagnosis not present

## 2018-06-03 DIAGNOSIS — F322 Major depressive disorder, single episode, severe without psychotic features: Secondary | ICD-10-CM | POA: Diagnosis not present

## 2018-06-04 DIAGNOSIS — F322 Major depressive disorder, single episode, severe without psychotic features: Secondary | ICD-10-CM | POA: Diagnosis not present

## 2018-06-11 MED FILL — VENLAFAXINE HCL ER 150 MG C: 150 | 90 days supply | Qty: 90 | Fill #0

## 2018-06-11 MED FILL — SERTRALINE HCL 100 MG TAB: 100 | 90 days supply | Qty: 180 | Fill #0

## 2018-06-17 DIAGNOSIS — F322 Major depressive disorder, single episode, severe without psychotic features: Secondary | ICD-10-CM | POA: Diagnosis not present

## 2018-06-20 DIAGNOSIS — F332 Major depressive disorder, recurrent severe without psychotic features: Secondary | ICD-10-CM | POA: Diagnosis not present

## 2018-06-20 DIAGNOSIS — F410 Panic disorder [episodic paroxysmal anxiety] without agoraphobia: Secondary | ICD-10-CM | POA: Diagnosis not present

## 2018-06-20 DIAGNOSIS — F900 Attention-deficit hyperactivity disorder, predominantly inattentive type: Secondary | ICD-10-CM | POA: Diagnosis not present

## 2018-07-16 DIAGNOSIS — F322 Major depressive disorder, single episode, severe without psychotic features: Secondary | ICD-10-CM | POA: Diagnosis not present

## 2018-07-22 ENCOUNTER — Other Ambulatory Visit: Payer: 59

## 2018-07-22 ENCOUNTER — Other Ambulatory Visit: Payer: Self-pay

## 2018-07-22 DIAGNOSIS — E559 Vitamin D deficiency, unspecified: Secondary | ICD-10-CM

## 2018-07-22 DIAGNOSIS — F3342 Major depressive disorder, recurrent, in full remission: Secondary | ICD-10-CM | POA: Diagnosis not present

## 2018-07-22 DIAGNOSIS — R7309 Other abnormal glucose: Secondary | ICD-10-CM | POA: Diagnosis not present

## 2018-07-22 DIAGNOSIS — E781 Pure hyperglyceridemia: Secondary | ICD-10-CM | POA: Diagnosis not present

## 2018-07-22 DIAGNOSIS — Z0000 Encounter for general adult medical examination without abnormal findings: Secondary | ICD-10-CM | POA: Diagnosis not present

## 2018-07-23 LAB — LIPID PANEL
Cholesterol: 248 mg/dL — ABNORMAL HIGH (ref <200)
HDL: 85 mg/dL (ref >=50)
LDL Cholesterol (Calc): 143 mg/dL (calc) — ABNORMAL HIGH
Non-HDL Cholesterol (Calc): 163 mg/dL (calc) — ABNORMAL HIGH (ref <130)
Total CHOL/HDL Ratio: 2.9 (calc) (ref <5.0)
Triglycerides: 94 mg/dL (ref <150)

## 2018-07-23 LAB — HEMOGLOBIN A1C
Hgb A1c MFr Bld: 5.1 % of total Hgb (ref <5.7)
Mean Plasma Glucose: 100 (calc)
eAG (mmol/L): 5.5 (calc)

## 2018-07-23 LAB — COMPLETE METABOLIC PANEL WITH GFR
AG Ratio: 1.7 (calc) (ref 1.0–2.5)
ALT: 17 U/L (ref 6–29)
AST: 27 U/L (ref 10–35)
Albumin: 4.2 g/dL (ref 3.6–5.1)
Alkaline phosphatase (APISO): 57 U/L (ref 31–125)
BUN: 13 mg/dL (ref 7–25)
CO2: 30 mmol/L (ref 20–32)
Calcium: 9.6 mg/dL (ref 8.6–10.2)
Chloride: 106 mmol/L (ref 98–110)
Creat: 0.66 mg/dL (ref 0.50–1.10)
GFR, Est African American: 124 mL/min/{1.73_m2} (ref >=60)
GFR, Est Non African American: 107 mL/min/{1.73_m2} (ref >=60)
Globulin: 2.5 g/dL (calc) (ref 1.9–3.7)
Glucose, Bld: 95 mg/dL (ref 65–99)
Potassium: 4.7 mmol/L (ref 3.5–5.3)
Sodium: 141 mmol/L (ref 135–146)
Total Bilirubin: 0.4 mg/dL (ref 0.2–1.2)
Total Protein: 6.7 g/dL (ref 6.1–8.1)

## 2018-07-23 LAB — CBC WITH DIFFERENTIAL/PLATELET
Absolute Monocytes: 319 cells/uL (ref 200–950)
Basophils Absolute: 39 cells/uL (ref 0–200)
Basophils Relative: 0.7 %
Eosinophils Absolute: 151 cells/uL (ref 15–500)
Eosinophils Relative: 2.7 %
HCT: 37.5 % (ref 35.0–45.0)
Hemoglobin: 12.5 g/dL (ref 11.7–15.5)
Lymphs Abs: 1977 cells/uL (ref 850–3900)
MCH: 32.8 pg (ref 27.0–33.0)
MCHC: 33.3 g/dL (ref 32.0–36.0)
MCV: 98.4 fL (ref 80.0–100.0)
MPV: 9.3 fL (ref 7.5–12.5)
Monocytes Relative: 5.7 %
Neutro Abs: 3114 cells/uL (ref 1500–7800)
Neutrophils Relative %: 55.6 %
Platelets: 316 10*3/uL (ref 140–400)
RBC: 3.81 10*6/uL (ref 3.80–5.10)
RDW: 11.5 % (ref 11.0–15.0)
Total Lymphocyte: 35.3 %
WBC: 5.6 10*3/uL (ref 3.8–10.8)

## 2018-07-23 LAB — TSH: TSH: 0.64 mIU/L

## 2018-07-23 LAB — VITAMIN D 25 HYDROXY (VIT D DEFICIENCY, FRACTURES): Vit D, 25-Hydroxy: 34 ng/mL (ref 30–100)

## 2018-07-29 ENCOUNTER — Encounter: Payer: Self-pay | Admitting: Family Medicine

## 2018-07-29 ENCOUNTER — Other Ambulatory Visit: Payer: Self-pay | Admitting: Family Medicine

## 2018-07-29 ENCOUNTER — Other Ambulatory Visit: Payer: Self-pay

## 2018-07-29 ENCOUNTER — Ambulatory Visit (INDEPENDENT_AMBULATORY_CARE_PROVIDER_SITE_OTHER): Payer: 59 | Admitting: Family Medicine

## 2018-07-29 VITALS — BP 118/68 | HR 84 | Temp 98.9°F | Resp 16 | Ht 69.0 in | Wt 195.0 lb

## 2018-07-29 DIAGNOSIS — F3342 Major depressive disorder, recurrent, in full remission: Secondary | ICD-10-CM | POA: Diagnosis not present

## 2018-07-29 DIAGNOSIS — G250 Essential tremor: Secondary | ICD-10-CM | POA: Diagnosis not present

## 2018-07-29 DIAGNOSIS — M8580 Other specified disorders of bone density and structure, unspecified site: Secondary | ICD-10-CM

## 2018-07-29 DIAGNOSIS — Z0000 Encounter for general adult medical examination without abnormal findings: Secondary | ICD-10-CM | POA: Diagnosis not present

## 2018-07-29 DIAGNOSIS — E7800 Pure hypercholesterolemia, unspecified: Secondary | ICD-10-CM | POA: Diagnosis not present

## 2018-07-29 DIAGNOSIS — R7309 Other abnormal glucose: Secondary | ICD-10-CM

## 2018-07-29 DIAGNOSIS — E559 Vitamin D deficiency, unspecified: Secondary | ICD-10-CM

## 2018-07-29 NOTE — Patient Instructions (Addendum)
Thank you for coming to the office today.  Great test results overall  1 Chemistry - Normal results, including electrolytes, kidney and liver function.  - Normal fasting blood sugar   2. Hemoglobin A1c (Diabetes screening) - 5.1, normal not in range of Pre-Diabetes (>5.7 to 6.4)   3. TSH Thyroid Function Tests - Normal.   4. CBC Blood Counts - Normal, no anemia, no other significant abnormality   5. Cholesterol - Elevated LDL cholesterol 143, up from last time 84.   6. Vitamin D 34   For cholesterol most likely due to Rexulti 3 mg dosage, very common side effect on this med.  Hopefully lower dose 2mg  will work also encourage regular exercise with walking / cardiovascular exercise.  DUE for FASTING BLOOD WORK (no food or drink after midnight before the lab appointment, only water or coffee without cream/sugar on the morning of)  SCHEDULE "Lab Only" visit in the morning at the clinic for lab draw in 3 MONTHS   - Make sure Lab Only appointment is at about 1 week before your next appointment, so that results will be available  For Lab Results, once available within 2-3 days of blood draw, you can can log in to MyChart online to view your results and a brief explanation. Also, we can discuss results at next follow-up visit.   Please schedule a Follow-up Appointment to: Return in about 3 months (around 10/29/2018) for fasting lipid panel.  If you have any other questions or concerns, please feel free to call the office or send a message through [facility]. You may also schedule an earlier appointment if necessary.  Additionally, you may be receiving a survey about your experience at our office within a few days to 1 week by e-mail or mail. We value your feedback.  Nobie Putnam, DO [place]

## 2018-07-29 NOTE — Assessment & Plan Note (Signed)
Stable, chronic problem, thought due to multiple factors including prior anti-depressants, also may be genetic component - Followed by Neurology [place], continue primidone 100 AM / 50mg  PM

## 2018-07-29 NOTE — Assessment & Plan Note (Signed)
Resolved Normal A1c, on different psych meds

## 2018-07-29 NOTE — Assessment & Plan Note (Signed)
Significant increase in LDL cholesterol 80 to 140s likely due to medication side effect on Rexulti 2nd gen anti-psych med Limited lifestyle exercise  Plan - Discussed options, agree with Psych lowering dose Rexulti from 3 to 2, and will repeat fasting lipid panel in 3 months here, f/u results, in past was on 1mg  in 2019 and did better. Defer med decision to Psych if need to change. Also offered statin as option but prefer to avoid if not needed - Improve regular exercise to help control lipids as well

## 2018-07-29 NOTE — Assessment & Plan Note (Signed)
Improved stable Vit D Continue supplement

## 2018-07-29 NOTE — Assessment & Plan Note (Signed)
Stable, on prior DEXA. No recent fractures H/o Vit D insufficiency Continue Calcium Vit D supplements  Due next DEXA Fall 2020 per GYN - f/u results

## 2018-07-29 NOTE — Assessment & Plan Note (Signed)
Currently stable chronic problem, on med regimen Followed by Dr Cephus Shelling Psychiatry On rexulti dose adjusted to 2mg  now from 3, recently due to med side effect hyperlipidemia

## 2018-07-29 NOTE — Progress Notes (Signed)
Subjective:    Patient ID: Carolyn GullyAmy L Dudenhoeffer, female    DOB: 04/23/1975, 43 y.o.   MRN: [id number]  Carolyn [name] is a 43 y.o. female presenting on 07/29/2018 for Annual Exam   HPI   Here for Annual Physical and Lab Review  Specialists Psychiatry - Caryn SectionAarti Kapur, MD [facility]  Chronic Major Depression, Remission - Chronic problem followed by Psychiatry - last office visit note reviewed 06/2018, , per chart review has been on variety of trials of many medications for mood including Zoloft, Lexapro, Fluoxetine, Celexa, Duloxetine, Venlafaxine, lamictal, risperdal, lithium, latuda - Interval update - dose increase over past 1 year on Rexulti from 1mg  up to 3mg , now very recently due to elevated lipids LDL cholesterol, yesterday she called psych and they reduced dose back down to 2mg  for now - Currently taking Zoloft 200mg  daily, Trazodone 100mg  x2 nightly, Lamictal - also infrequent clonazepam 0.5mg  maybe once weekly - Today her mood is doing well. She is hopeful she can improve cholesterol as well with lifestyle and will work with Psych on med dose  Hyperlipidemia Elevated LDL on last lipid panel, thought to be due to Rexulti medication common side effect, LDL up froms 80s to 140s Not on statin, not interested Not exercising currently.  Essential Tremor, Chronic Followed by Dr Sherryll BurgerShah [facility]), due in Fall/Winter 2020 - Chronic problem with tremors in bilateral hands, legs >6 years, [facility], continues on taking Primidone 100 AM and 50mg  PM - Prior numbness in R foot has improved, took alpha lipoic acid  Elevated A1c - RESOLVED Last A1c improved again 5.1, normal In past thought psych med increased sugar, now not increased on rexulti  Vitamin D Insufficiency - Prior history of deficiency - last result 34 (07/2018), taking Vitamin D3 2k daily  Additional update - Has remaining Flexeril - stopped taking after prior head  injury, now has PRN if need for back.    Additional update - last visit discussed OSA. She did not have positive screening, and did not pursue PSG.  Health Maintenance:  Cervical Cancer Screening - Due for routine Pap Smear, [facility]  Osteopenia > DEXA to be done at GYN as well  Next Mammogram from OB GYN. Fam history of breast cancer see below  No known fam history of colon cancer. Declines screening at age 43, she will reconsider for age 43.  [facility] 07/29/2018 10/28/2017 07/02/2017  Decreased Interest 0 0 0  Down, Depressed, Hopeless 0 0 1  PHQ - 2 Score 0 0 1  Altered sleeping 1 0 0  Tired, decreased energy 1 0 1  Change in appetite 0 0 0  Feeling bad or failure about yourself  0 0 0  Trouble concentrating 1 0 1  Moving slowly or fidgety/restless 0 0 0  Suicidal thoughts 0 0 0  PHQ-9 Score 3 0 3  Difficult doing work/chores Not difficult at all Not difficult at all Not difficult at all   GAD 7 : Generalized Anxiety Score 07/29/2018 07/02/2017  Nervous, Anxious, on Edge 0 0  Control/stop worrying 0 0  Worry too much - different things 0 0  Trouble relaxing 1 1  Restless 0 0  Easily annoyed or irritable 0 0  Afraid - awful might happen 0 0  Total GAD 7 Score 1 1  Anxiety Difficulty Not difficult at all Not difficult at all  Past Medical History:  Diagnosis Date  . Anemia    a. low-normal H/H  . Anxiety   . Arthritis    osteopenia  . Benign essential tremor    a. bilateral upper and lower extremities; b. followed by [facility]; c. felt to be 2/2 antidepressants   . Carpal tunnel syndrome   . Deafness in left ear   . Palpitations    Past Surgical History:  Procedure Laterality Date  . axillary lymph node removal Right 1982   enlarged lymph node  . IUD REMOVAL N/A 04/23/2014   Procedure: INTRAUTERINE DEVICE (IUD) REMOVAL;  Surgeon: Everlene Farrier, MD;  Location: [facility];  Service: Gynecology;   Laterality: N/A;  . LAPAROSCOPIC TUBAL LIGATION Bilateral 04/23/2014   Procedure: LAPAROSCOPIC TUBAL LIGATION with Filshie Clips;  Surgeon: Everlene Farrier, MD;  Location: [facility];  Service: Gynecology;  Laterality: Bilateral;   Social History   Socioeconomic History  . Marital status: Married    Spouse name: Not on file  . Number of children: Not on file  . Years of education: Not on file  . Highest education level: Not on file  Occupational History  . 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 Financial resource strain: Not on file  . Food insecurity    Worry: Not on file    Inability: Not on file  . Transportation needs    Medical: Not on file    Non-medical: Not on file  Tobacco Use  . Smoking status: Never Smoker  . Smokeless tobacco: Never Used  Substance and Sexual Activity  . Alcohol use: Yes    Comment: occ  . Drug use: No  . Sexual activity: Yes    Birth control/protection: Surgical  Lifestyle  . Physical activity    Days per week: Not on file    Minutes per session: Not on file  . Stress: Not on file  Relationships  . Social Herbalist on phone: Not on file    Gets together: Not on file    Attends religious service: Not on file    Active member of club or organization: Not on file    Attends meetings of clubs or organizations: Not on file    Relationship status: Not on file  . Intimate partner violence    Fear of current or ex partner: Not on file    Emotionally abused: Not on file    Physically abused: Not on file    Forced sexual activity: Not on file  Other Topics Concern  . Not on file  Social History Narrative  . Not on file   Family History  Problem Relation Age of Onset  . Depression Mother        bipolar disorder  . Arthritis Mother        osteoprosis  . Heart disease Father   . Heart attack Father 32  . Heart disease Paternal Grandmother   . Cancer Paternal Grandmother        breast cancer  .  Colon cancer Neg Hx   . Bladder Cancer Neg Hx   . Kidney cancer Neg Hx    Current Outpatient Medications on File Prior to Visit  Medication Sig  . cetirizine (ZYRTEC ALLERGY) 10 MG tablet   . Cholecalciferol (VITAMIN D3) 2000 units TABS Take 2,000 Units by mouth daily.  . clonazePAM (KLONOPIN) 0.5 MG tablet Take 0.5 mg by mouth 2 (two) times daily as needed for anxiety.  Marland Kitchen  guaiFENesin (MUCINEX) 600 MG 12 hr tablet   . lamoTRIgine (LAMICTAL) 200 MG tablet   . LAVENDER OIL PO   . mirabegron ER (MYRBETRIQ) 25 MG TB24 tablet Take 1 tablet (25 mg total) by mouth daily.  . Multiple Vitamin (MULTI-VITAMIN) tablet Take by mouth.  . Multiple Vitamins-Minerals (MULTIVITAMIN PO) Take 1 tablet by mouth daily.  . primidone (MYSOLINE) 50 MG tablet Take 100 mg by mouth 2 (two) times daily.  Marland Kitchen. REXULTI 2 MG TABS Take 2 mg by mouth daily.  . sertraline (ZOLOFT) 100 MG tablet Take 200 mg by mouth daily.  . traZODone (DESYREL) 100 MG tablet   . venlafaxine XR (EFFEXOR-XR) 150 MG 24 hr capsule   . vitamin E 1000 UNIT capsule    No current facility-administered medications on file prior to visit.     Review of Systems  Constitutional: Negative for activity change, appetite change, chills, diaphoresis, fatigue and fever.  HENT: Negative for congestion and hearing loss.   Eyes: Negative for visual disturbance.  Respiratory: Negative for apnea, cough, choking, chest tightness, shortness of breath and wheezing.   Cardiovascular: Negative for chest pain, palpitations and leg swelling.  Gastrointestinal: Negative for abdominal pain, anal bleeding, blood in stool, constipation, diarrhea, nausea and vomiting.  Endocrine: Negative for cold intolerance.  Genitourinary: Positive for frequency (improved). Negative for difficulty urinating, dysuria and hematuria.  Musculoskeletal: Negative for arthralgias, back pain and neck pain.  Skin: Negative for rash.  Allergic/Immunologic: Negative for environmental  allergies.  Neurological: Negative for dizziness, weakness, light-headedness, numbness and headaches.  Hematological: Negative for adenopathy.  Psychiatric/Behavioral: Negative for behavioral problems, dysphoric mood and sleep disturbance. The patient is not nervous/anxious.    Per HPI unless specifically indicated above     Objective:    BP 118/68   Pulse 84   Temp 98.9 F (37.2 C) (Oral)   Resp 16   Ht 5\' 9"  (1.753 m)   Wt 195 lb (88.5 kg)   BMI 28.80 kg/m   Wt Readings from Last 3 Encounters:  07/29/18 195 lb (88.5 kg)  10/28/17 189 lb 9.6 oz (86 kg)  09/11/17 185 lb (83.9 kg)    Physical Exam Vitals signs and nursing note reviewed.  Constitutional:      General: She is not in acute distress.    Appearance: She is well-developed. She is not diaphoretic.     Comments: Well-appearing, comfortable, cooperative  HENT:     Head: Normocephalic and atraumatic.  Eyes:     General:        Right eye: No discharge.        Left eye: No discharge.     Conjunctiva/sclera: Conjunctivae normal.     Pupils: Pupils are equal, round, and reactive to light.  Neck:     Musculoskeletal: Normal range of motion and neck supple.     Thyroid: No thyromegaly.  Cardiovascular:     Rate and Rhythm: Normal rate and regular rhythm.     Heart sounds: Normal heart sounds. No murmur.  Pulmonary:     Effort: Pulmonary effort is normal. No respiratory distress.     Breath sounds: Normal breath sounds. No wheezing or rales.  Abdominal:     General: Bowel sounds are normal. There is no distension.     Palpations: Abdomen is soft. There is no mass.     Tenderness: There is no abdominal tenderness.  Musculoskeletal: Normal range of motion.        General: No tenderness.  Comments: Upper / Lower Extremities: - Normal muscle tone, strength bilateral upper extremities 5/5, lower extremities 5/5  Lymphadenopathy:     Cervical: No cervical adenopathy.  Skin:    General: Skin is warm and dry.      Findings: No erythema or rash.  Neurological:     Mental Status: She is alert and oriented to person, place, and time.     Comments: Distal sensation intact to light touch all extremities  Psychiatric:        Behavior: Behavior normal.     Comments: Well groomed, good eye contact, normal speech and thoughts        Results for orders placed or performed in visit on 07/22/18  TSH  Result Value Ref Range   TSH 0.64 mIU/L  Hemoglobin A1c  Result Value Ref Range   Hgb A1c MFr Bld 5.1 <5.7 % of total Hgb   Mean Plasma Glucose 100 (calc)   eAG (mmol/L) 5.5 (calc)  CBC with Differential/Platelet  Result Value Ref Range   WBC 5.6 3.8 - 10.8 Thousand/uL   RBC 3.81 3.80 - 5.10 Million/uL   Hemoglobin 12.5 11.7 - 15.5 g/dL   HCT 16.137.5 09.635.0 - 04.545.0 %   MCV 98.4 80.0 - 100.0 fL   MCH 32.8 27.0 - 33.0 pg   MCHC 33.3 32.0 - 36.0 g/dL   RDW 40.911.5 81.111.0 - 91.415.0 %   Platelets 316 140 - 400 Thousand/uL   MPV 9.3 7.5 - 12.5 fL   Neutro Abs 3,114 1,500 - 7,800 cells/uL   Lymphs Abs 1,977 850 - 3,900 cells/uL   Absolute Monocytes 319 200 - 950 cells/uL   Eosinophils Absolute 151 15 - 500 cells/uL   Basophils Absolute 39 0 - 200 cells/uL   Neutrophils Relative % 55.6 %   Total Lymphocyte 35.3 %   Monocytes Relative 5.7 %   Eosinophils Relative 2.7 %   Basophils Relative 0.7 %  COMPLETE METABOLIC PANEL WITH GFR  Result Value Ref Range   Glucose, Bld 95 65 - 99 mg/dL   BUN 13 7 - 25 mg/dL   Creat 7.820.66 9.560.50 - 2.131.10 mg/dL   GFR, Est Non African American 107 > OR = 60 mL/min/1.7373m2   GFR, Est African American 124 > OR = 60 mL/min/1.2573m2   BUN/Creatinine Ratio NOT APPLICABLE 6 - 22 (calc)   Sodium 141 135 - 146 mmol/L   Potassium 4.7 3.5 - 5.3 mmol/L   Chloride 106 98 - 110 mmol/L   CO2 30 20 - 32 mmol/L   Calcium 9.6 8.6 - 10.2 mg/dL   Total Protein 6.7 6.1 - 8.1 g/dL   Albumin 4.2 3.6 - 5.1 g/dL   Globulin 2.5 1.9 - 3.7 g/dL (calc)   AG Ratio 1.7 1.0 - 2.5 (calc)   Total Bilirubin 0.4 0.2 -  1.2 mg/dL   Alkaline phosphatase (APISO) 57 31 - 125 U/L   AST 27 10 - 35 U/L   ALT 17 6 - 29 U/L  Lipid panel  Result Value Ref Range   Cholesterol 248 (H) <200 mg/dL   HDL 85 > OR = 50 mg/dL   Triglycerides 94 <086<150 mg/dL   LDL Cholesterol (Calc) 143 (H) mg/dL (calc)   Total CHOL/HDL Ratio 2.9 <5.0 (calc)   Non-HDL Cholesterol (Calc) 163 (H) <130 mg/dL (calc)  VITAMIN D 25 Hydroxy (Vit-D Deficiency, Fractures)  Result Value Ref Range   Vit D, 25-Hydroxy 34 30 - 100 ng/mL      Assessment &  Plan:   Problem List Items Addressed This Visit    Benign essential tremor    Stable, chronic problem, thought due to multiple factors including prior anti-depressants, also may be genetic component - Followed by [facility], continue primidone 100 AM /  PM      RESOLVED: Elevated hemoglobin A1c    Resolved Normal A1c, on different psych meds      Hyperlipidemia    Significant increase in LDL cholesterol 80 to 140s likely due to medication side effect on Rexulti 2nd gen anti-psych med Limited lifestyle exercise  Plan - Discussed options, agree with Psych lowering dose Rexulti from 3 to 2, and will repeat fasting lipid panel in 3 months here, f/u results, in past was on  in 2019 and did better. Defer med decision to Psych if need to change. Also offered statin as option but prefer to avoid if not needed - Improve regular exercise to help control lipids as well      Osteopenia    Stable, on prior DEXA. No recent fractures H/o Vit D insufficiency Continue Calcium Vit D supplements  Due next DEXA Fall 2020 per GYN - f/u results      Recurrent major depression in full remission (HCC)    Currently stable chronic problem, on med regimen Followed by Dr Caryn Section Psychiatry On rexulti dose adjusted to  now from 3, recently due to med side effect hyperlipidemia      Relevant Medications   REXULTI 2 MG TABS   Vitamin D insufficiency    Improved stable Vit D Continue  supplement       Other Visit Diagnoses    Annual physical exam    -  Primary      Updated Health Maintenance information - Offered future Colon cancer screening colonoscopy vs cologuard at age 3+, she declines today - Will follow up with [facility] for routine pap smear, mammogram, DEXA in 09/2018 Reviewed recent lab results with patient Encouraged improvement to lifestyle with diet and exercise - Goal of weight loss   No orders of the defined types were placed in this encounter.   Follow up plan: Return in about 3 months (around 10/29/2018) for fasting lipid panel.   Future labs lipid 3 months, 10/31/18 ordered  Saralyn Pilar, DO [facility] 07/29/2018, 3:48 PM

## 2018-08-13 DIAGNOSIS — F322 Major depressive disorder, single episode, severe without psychotic features: Secondary | ICD-10-CM | POA: Diagnosis not present

## 2018-08-27 DIAGNOSIS — F322 Major depressive disorder, single episode, severe without psychotic features: Secondary | ICD-10-CM | POA: Diagnosis not present

## 2018-08-28 ENCOUNTER — Ambulatory Visit (INDEPENDENT_AMBULATORY_CARE_PROVIDER_SITE_OTHER): Payer: 59

## 2018-08-28 ENCOUNTER — Encounter: Payer: Self-pay | Admitting: Emergency Medicine

## 2018-08-28 ENCOUNTER — Telehealth: Payer: 59 | Admitting: Emergency Medicine

## 2018-08-28 ENCOUNTER — Ambulatory Visit
Admission: EM | Admit: 2018-08-28 | Discharge: 2018-08-28 | Disposition: A | Discharge status: Home / Self Care | Payer: 59 | Attending: Family Medicine | Admitting: Family Medicine

## 2018-08-28 ENCOUNTER — Other Ambulatory Visit: Payer: Self-pay

## 2018-08-28 DIAGNOSIS — R6889 Other general symptoms and signs: Secondary | ICD-10-CM

## 2018-08-28 DIAGNOSIS — J988 Other specified respiratory disorders: Secondary | ICD-10-CM | POA: Diagnosis not present

## 2018-08-28 DIAGNOSIS — U071 COVID-19: Secondary | ICD-10-CM

## 2018-08-28 DIAGNOSIS — R05 Cough: Secondary | ICD-10-CM | POA: Diagnosis not present

## 2018-08-28 DIAGNOSIS — R509 Fever, unspecified: Secondary | ICD-10-CM

## 2018-08-28 DIAGNOSIS — R51 Headache: Secondary | ICD-10-CM

## 2018-08-28 DIAGNOSIS — R110 Nausea: Secondary | ICD-10-CM

## 2018-08-28 NOTE — ED Provider Notes (Signed)
[facility]    CSN: [id number] Arrival date & time: 08/28/18  1231  History   Chief Complaint Chief Complaint  Patient presents with  . Follow-up    chest xray   HPI   43 year old female presents for evaluation regarding fever and persistent symptoms related to COVID-19.  Patient states that she initially developed symptoms on 8/12.  He states that she was initially tested but that the test sample was lost or that there was an error with the test sample.  She subsequently was retested on Tuesday of this week.  She states that her COVID test was positive.  She states that she has had fever as of yesterday, T-max 100.9.  He has had ongoing fatigue, nausea, headache, loss of taste and smell, cough.  Denies shortness of breath.  She called her primary care provider today given fever and recent worsening of symptoms and she was told to come in for evaluation for possible secondary infection.  She is taking Tylenol and Motrin for her fever and it seems to be responding.  She has been sleeping quite a bit due to fatigue.  No other medications or interventions tried.  No known exacerbating factors.  No other reported symptoms.  No other complaints.  PMH, Surgical Hx, Family Hx, Social History reviewed and updated as below.  Past Medical History:  Diagnosis Date  . Anemia    a. low-normal H/H  . Anxiety   . Arthritis    osteopenia  . Benign essential tremor    a. bilateral upper and lower extremities; b. followed by [facility]; c. felt to be 2/2 antidepressants   . Carpal tunnel syndrome   . Deafness in left ear   . Palpitations     Patient Active Problem List   Diagnosis Date Noted  . Hyperlipidemia 05/17/2017  . Perimenopausal 02/13/2016  . 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 Numbness of right foot 11/29/2015  . Osteopenia 11/29/2015  . Vitamin D insufficiency 11/29/2015  . Palpitations   . Recurrent major depression in full remission ([place])   .  Deafness in left ear   . Anxiety 04/08/2013  . Benign essential tremor 04/08/2013  . Carpal tunnel syndrome 04/08/2013    Past Surgical History:  Procedure Laterality Date  . axillary lymph node removal Right 1982   enlarged lymph node  . IUD REMOVAL N/A 04/23/2014   Procedure: INTRAUTERINE DEVICE (IUD) REMOVAL;  Surgeon: Everlene Farrier, MD;  Location: [facility];  Service: Gynecology;  Laterality: N/A;  . LAPAROSCOPIC TUBAL LIGATION Bilateral 04/23/2014   Procedure: LAPAROSCOPIC TUBAL LIGATION with Filshie Clips;  Surgeon: Everlene Farrier, MD;  Location: [facility];  Service: Gynecology;  Laterality: Bilateral;    OB History   No obstetric history on file.      Home Medications    Prior to Admission medications   Medication Sig Start Date End Date Taking? Authorizing Provider  cetirizine (ZYRTEC ALLERGY) 10 MG tablet    Yes [provider]  Cholecalciferol (VITAMIN D3) 2000 units TABS Take 2,000 Units by mouth daily.   Yes [provider]  clonazePAM (KLONOPIN) 0.5 MG tablet Take 0.5 mg by mouth 2 (two) times daily as needed for anxiety.   Yes [provider]  guaiFENesin (MUCINEX) 600 MG 12 hr tablet    Yes [provider]  lamoTRIgine (LAMICTAL) 200 MG tablet  07/02/17  Yes [provider]  LAVENDER OIL PO    Yes [provider]  mirabegron  ER (MYRBETRIQ) 25 MG TB24 tablet Take 1 tablet (25 mg total) by mouth daily. 10/30/17  Yes McGowan, Carollee HerterShannon A, PA-C  Multiple Vitamin (MULTI-VITAMIN) tablet Take by mouth.   Yes [provider]  Multiple Vitamins-Minerals (MULTIVITAMIN PO) Take 1 tablet by mouth daily.   Yes [provider]  primidone (MYSOLINE) 50 MG tablet Take 100 mg by mouth 2 (two) times daily.   Yes [provider]  REXULTI 2 MG TABS Take 2 mg by mouth daily. 07/29/18  Yes Karamalegos, Netta NeatAlexander J, DO  sertraline (ZOLOFT) 100 MG tablet Take 200 mg by mouth daily.   Yes [provider]   traZODone (DESYREL) 100 MG tablet  04/12/17  Yes [provider]  venlafaxine XR (EFFEXOR-XR) 150 MG 24 hr capsule  07/22/17  Yes [provider]  vitamin E 1000 UNIT capsule  05/16/17  Yes [provider]    Family History Family History  Problem Relation Age of Onset  . Depression Mother        bipolar disorder  . Arthritis Mother        osteoprosis  . Heart disease Father   . Heart attack Father 2660  . Heart disease Paternal Grandmother   . Cancer Paternal Grandmother        breast cancer  . Colon cancer Neg Hx   . Bladder Cancer Neg Hx   . Kidney cancer Neg Hx     Social History Social History   Tobacco Use  . Smoking status: Never Smoker  . Smokeless tobacco: Never Used  Substance Use Topics  . Alcohol use: Yes    Comment: occ  . Drug use: No     Allergies   Latex   Review of Systems Review of Systems  Constitutional: Positive for fatigue and fever.  HENT:       Loss of taste & smell.  Respiratory: Positive for cough. Negative for shortness of breath.   Gastrointestinal: Positive for nausea.  Neurological: Positive for headaches.   Physical Exam Triage Vital Signs ED Triage Vitals  Enc Vitals Group     BP 08/28/18 1250 113/73     Pulse Rate 08/28/18 1250 97     Resp 08/28/18 1250 18     Temp 08/28/18 1250 99.2 F (37.3 C)     Temp Source 08/28/18 1250 Oral     SpO2 08/28/18 1250 97 %     Weight 08/28/18 1254 195 lb (88.5 kg)     Height 08/28/18 1254 5\' 9"  (1.753 m)     Head Circumference --      Peak Flow --      Pain Score 08/28/18 1254 4     Pain Loc --      Pain Edu? --      Excl. in GC? --    Updated Vital Signs BP 113/73 (BP Location: Right Arm)   Pulse 97   Temp 99.2 F (37.3 C) (Oral)   Resp 18   Ht 5\' 9"  (1.753 m)   Wt 88.5 kg   SpO2 97%   BMI 28.80 kg/m   Visual Acuity Right Eye Distance:   Left Eye Distance:   Bilateral Distance:    Right Eye Near:   Left Eye Near:    Bilateral Near:      Physical Exam Vitals signs and nursing note reviewed.  Constitutional:      General: She is not in acute distress.    Appearance: Normal appearance.  HENT:  Head: Normocephalic and atraumatic.     Mouth/Throat:     Mouth: Mucous membranes are moist.     Pharynx: Oropharynx is clear. No oropharyngeal exudate or posterior oropharyngeal erythema.  Eyes:     General: No scleral icterus.       Right eye: No discharge.        Left eye: No discharge.     Conjunctiva/sclera: Conjunctivae normal.  Cardiovascular:     Rate and Rhythm: Normal rate and regular rhythm.     Heart sounds: Normal heart sounds, S1 normal and S2 normal. No murmur.  Pulmonary:     Effort: Pulmonary effort is normal. No tachypnea.     Breath sounds: Normal breath sounds. No wheezing or rales.  Abdominal:     General: There is no distension.     Palpations: Abdomen is soft.     Tenderness: There is no abdominal tenderness.  Neurological:     General: No focal deficit present.     Mental Status: She is alert and oriented to person, place, and time.  Psychiatric:     Comments: Flat affect. Depressed mood.    UC Treatments / Results  Labs (all labs ordered are listed, but only abnormal results are displayed) Labs Reviewed - No data to display  EKG   Radiology Dg Chest 2 View  Result Date: 08/28/2018 CLINICAL DATA:  Fever and cough.  COVID-19 positive. EXAM: CHEST - 2 VIEW COMPARISON:  None. FINDINGS: Lungs are adequately inflated and otherwise clear. Cardiomediastinal silhouette is normal. Bones and soft tissues are normal. IMPRESSION: No active cardiopulmonary disease. Electronically Signed   By: Elberta Fortisaniel  Boyle M.D.   On: 08/28/2018 13:22    Procedures Procedures (including critical care time)  Medications Ordered in UC Medications - No data to display  Initial Impression / Assessment and Plan / UC Course  I have reviewed the triage vital signs and the nursing notes.  Pertinent labs & imaging  results that were available during my care of the patient were reviewed by me and considered in my medical decision making (see chart for details).    43 year old female presents with continued fever and symptoms related to COVID-19.  Chest x-ray obtained and was negative.  Patient is well-appearing clinically.  Advised continued symptomatic treatment with Tylenol and ibuprofen.  Supportive care.   Final Clinical Impressions(s) / UC Diagnoses   Final diagnoses:  COVID-19     Discharge Instructions     Rest.  Fluids.  Chest xray negative.  Take care  Dr. Adriana Simasook     ED Prescriptions    None     Controlled Substance Prescriptions [place] Controlled Substance Registry consulted? Not Applicable   Tommie SamsCook, William Schake G, DO 08/28/18 [id number]

## 2018-08-28 NOTE — Discharge Instructions (Signed)
Rest.  Fluids.  Chest xray negative.  Take care  Dr. Lacinda Axon

## 2018-08-28 NOTE — Progress Notes (Addendum)
Based on what you shared with me, I feel your condition warrants further evaluation and I recommend that you be seen for a face to face office visit to ensure you do not have a secondary bacterial infection such as a sinus infection or pneumonia.  Further testing such as a chest x-ray may be performed in a face-to-face visit.   NOTE: If you entered your credit card information for this eVisit, you will not be charged. You may see a "hold" on your card for the $35 but that hold will drop off and you will not have a charge processed.  If you are having a true medical emergency please call 911.     For an urgent face to face visit, [facility] has five urgent care centers for your convenience:   [email or web address] to reserve your spot online an avoid wait times  [place], [place], [place] [address] *Just off [address], across the road from [place] hours of operation: Monday-Friday, 12 PM to 6 PM  Closed Saturday & Sunday    . [facility]    [phone]                  Get Driving Directions  [address], [place] [address] . 10 am to 8 pm Monday-Friday . 12 pm to 8 pm Saturday-Sunday   . [facility] at [place]                  Get Driving Directions  [address], [place], [place] [address] . 8 am to 8 pm Monday-Friday . 9 am to 6 pm Saturday . 11 am to 6 pm Sunday   . [facility] at [place]                  Get Driving Directions   [address] [place], [place] [address] . 8 am to 8 pm Monday-Friday . 8 am to 4 pm Saturday-Sunday    . [facility] at [place]                    Get Driving Directions  [phone]  [address] [place], [place] [address]  . Monday-Friday, 12 PM to 6 PM    Your e-visit answers were reviewed by a  board certified advanced clinical practitioner to complete your personal care plan.  Thank you for using e-Visits.  Approximately 5 minutes was spent documenting and reviewing patient's chart.

## 2018-08-28 NOTE — ED Triage Notes (Signed)
Patient was tested for COVID on Tuesday 08/18 and it came back positive.Patient developed symptoms on 08/12. Yesterday she started having a temp. Highest was 100.9. She has been taking Motrin and Tylenol. She did a virtual visit this morning and was told to come here to be checked for a secondary infection. She states now she has a cough, headache, nausea and low grade fever, loss of taste and loss of smell.

## 2018-09-10 DIAGNOSIS — F322 Major depressive disorder, single episode, severe without psychotic features: Secondary | ICD-10-CM | POA: Diagnosis not present

## 2018-09-12 ENCOUNTER — Ambulatory Visit: Payer: 59 | Admitting: Urology

## 2018-09-16 DIAGNOSIS — F332 Major depressive disorder, recurrent severe without psychotic features: Secondary | ICD-10-CM | POA: Diagnosis not present

## 2018-09-16 DIAGNOSIS — F900 Attention-deficit hyperactivity disorder, predominantly inattentive type: Secondary | ICD-10-CM | POA: Diagnosis not present

## 2018-09-16 DIAGNOSIS — F410 Panic disorder [episodic paroxysmal anxiety] without agoraphobia: Secondary | ICD-10-CM | POA: Diagnosis not present

## 2018-09-19 ENCOUNTER — Ambulatory Visit: Payer: Self-pay | Admitting: Certified Nurse Midwife

## 2018-09-24 DIAGNOSIS — F322 Major depressive disorder, single episode, severe without psychotic features: Secondary | ICD-10-CM | POA: Diagnosis not present

## 2018-10-02 NOTE — Progress Notes (Signed)
10/03/2018 2:47 PM   Carolyn [name] 1975/08/16 [id number]  Referring provider: Olin Hauser, DO [address] [place],  [place] [address]  Chief Complaint  Patient presents with  . Over Active Bladder    HPI: Patient is a 43 year old female who presents today for a yearly follow up for OAB and cystocele.  The patient is  experiencing urgency x 0-3 (stable), frequency x 4-7 (stable), not restricting fluids to avoid visits to the restroom, not engaging in toilet mapping, incontinence x 0-3 (stable) and nocturia x (stable).   Her BP is 122/84.   Her PVR is 153 mL.    She is experiencing frequency, urgency, incontinence and straining to urinate.  Patient denies any gross hematuria, dysuria or suprapubic/flank pain.  Patient denies any fevers, chills, nausea or vomiting.  She is having constipation and painful intercourse as well.    She is currently on Myrbetriq 25 mg daily.   She did complete her PT in 12/2017.   PMH: Past Medical History:  Diagnosis Date  . Anemia    a. low-normal H/H  . Anxiety   . Arthritis    osteopenia  . Benign essential tremor    a. bilateral upper and lower extremities; b. followed by [facility]; c. felt to be 2/2 antidepressants   . Carpal tunnel syndrome   . Deafness in left ear   . Palpitations     Surgical History: Past Surgical History:  Procedure Laterality Date  . axillary lymph node removal Right 1982   enlarged lymph node  . IUD REMOVAL N/A 04/23/2014   Procedure: INTRAUTERINE DEVICE (IUD) REMOVAL;  Surgeon: Everlene Farrier, MD;  Location: [facility];  Service: Gynecology;  Laterality: N/A;  . LAPAROSCOPIC TUBAL LIGATION Bilateral 04/23/2014   Procedure: LAPAROSCOPIC TUBAL LIGATION with Filshie Clips;  Surgeon: Everlene Farrier, MD;  Location: [facility];  Service: Gynecology;  Laterality: Bilateral;    Home Medications:  Allergies as of 10/03/2018      Reactions   Latex Itching, Rash   sensitivity      Medication List       Accurate as of  October 03, 2018 11:59 PM. If you have any questions, ask your nurse or doctor.        STOP taking these medications   LAVENDER OIL PO Stopped by: Mathius Birkeland, PA-C   Multi-Vitamin tablet Stopped by: Zara Council, PA-C     TAKE these medications   clonazePAM 0.5 MG tablet Commonly known as: KLONOPIN Take 0.5 mg by mouth 2 (two) times daily as needed for anxiety.   lamoTRIgine 200 MG tablet Commonly known as: LAMICTAL   mirabegron ER 25 MG Tb24 tablet Commonly known as: MYRBETRIQ Take 1 tablet (25 mg total) by mouth daily.   Mucinex 600 MG 12 hr tablet Generic drug: guaiFENesin   MULTIVITAMIN PO Take 1 tablet by mouth daily.   primidone 50 MG tablet Commonly known as: MYSOLINE Take 100 mg by mouth 2 (two) times daily.   Rexulti 2 MG Tabs tablet Generic drug: brexpiprazole Take 2 mg by mouth daily.   sertraline 100 MG tablet Commonly known as: ZOLOFT Take 200 mg by mouth daily.   traZODone 100 MG tablet Commonly known as: DESYREL   venlafaxine XR 150 MG 24 hr capsule Commonly known as: EFFEXOR-XR   Vitamin D3 50 MCG (2000 UT) Tabs Take 2,000 Units by mouth daily.   vitamin E 1000 UNIT capsule   ZyrTEC Allergy 10 MG tablet Generic drug: cetirizine  Allergies:  Allergies  Allergen Reactions  . Latex Itching and Rash    sensitivity    Family History: Family History  Problem Relation Age of Onset  . Depression Mother        bipolar disorder  . Arthritis Mother        osteoprosis  . Heart disease Father   . Heart attack Father 34  . Heart disease Paternal Grandmother   . Cancer Paternal Grandmother        breast cancer  . Colon cancer Neg Hx   . Bladder Cancer Neg Hx   . Kidney cancer Neg Hx     Social History:  reports that she has never smoked. She has never used smokeless tobacco. She reports current alcohol use. She reports that she does not use drugs.  ROS: UROLOGY Frequent Urination?: Yes Hard to postpone urination?:  Yes Burning/pain with urination?: No Get up at night to urinate?: No Leakage of urine?: Yes Urine stream starts and stops?: No Trouble starting stream?: No Do you have to strain to urinate?: Yes Blood in urine?: No Urinary tract infection?: No Sexually transmitted disease?: No Injury to kidneys or bladder?: No Painful intercourse?: Yes Weak stream?: No Currently pregnant?: No Vaginal bleeding?: No Last menstrual period?: n  Gastrointestinal Nausea?: No Vomiting?: No Indigestion/heartburn?: No Diarrhea?: No Constipation?: Yes  Constitutional Fever: No Night sweats?: Yes Weight loss?: No Fatigue?: No  Skin Skin rash/lesions?: No Itching?: No  Eyes Blurred vision?: No Double vision?: No  Ears/Nose/Throat Sore throat?: No Sinus problems?: No  Hematologic/Lymphatic Swollen glands?: No Easy bruising?: Yes  Cardiovascular Leg swelling?: No Chest pain?: No  Respiratory Cough?: No Shortness of breath?: No  Endocrine Excessive thirst?: No  Musculoskeletal Back pain?: No Joint pain?: No  Neurological Headaches?: No Dizziness?: No  Psychologic Depression?: Yes Anxiety?: Yes  Physical Exam: BP 122/84 (BP Location: Left Arm, Patient Position: Sitting, Cuff Size: Normal)   Pulse 85   Ht 5\' 9"  (1.753 m)   Wt 196 lb 8 oz (89.1 kg)   BMI 29.02 kg/m   Constitutional:  Well nourished. Alert and oriented, No acute distress. HEENT: [place] AT, moist mucus membranes.  Trachea midline, no masses. Cardiovascular: No clubbing, cyanosis, or edema. Respiratory: Normal respiratory effort, no increased work of breathing. GI: Abdomen is soft, non tender, non distended, no abdominal masses. Liver and spleen not palpable.  No hernias appreciated.  Stool sample for occult testing is not indicated.   GU: No CVA tenderness.  No bladder fullness or masses.  Nromal external genitalia, normal pubic hair distribution, no lesions.  Normal urethral meatus, no lesions, no prolapse,  no discharge.   No urethral masses, tenderness and/or tenderness. No bladder fullness, tenderness or masses. normal vagina mucosa, good estrogen effect, no discharge, no lesions, fair pelvic support, grade II cystocele and no rectocele noted.  No cervical motion tenderness.  Uterus is freely mobile and non-fixed.  No adnexal/parametria masses or tenderness noted.  Anus and perineum are without rashes or lesions.    Skin: No rashes, bruises or suspicious lesions. Lymph: No inguinal adenopathy. Neurologic: Grossly intact, no focal deficits, moving all 4 extremities. Psychiatric: Normal mood and affect.   Laboratory Data: Lab Results  Component Value Date   WBC 5.6 07/22/2018   HGB 12.5 07/22/2018   HCT 37.5 07/22/2018   MCV 98.4 07/22/2018   PLT 316 07/22/2018    Lab Results  Component Value Date   CREATININE 0.66 07/22/2018    No results found  for: PSA  No results found for: TESTOSTERONE  Lab Results  Component Value Date   HGBA1C 5.1 07/22/2018    Lab Results  Component Value Date   TSH 0.64 07/22/2018       Component Value Date/Time   CHOL 248 (H) 07/22/2018 0828   HDL 85 07/22/2018 0828   CHOLHDL 2.9 07/22/2018 0828   VLDL 34 07/28/2015 0757   LDLCALC 143 (H) 07/22/2018 0828    Lab Results  Component Value Date   AST 27 07/22/2018   Lab Results  Component Value Date   ALT 17 07/22/2018   No components found for: ALKALINEPHOPHATASE No components found for: BILIRUBINTOTAL  No results found for: ESTRADIOL  I have reviewed the labs.   Pertinent Imaging: Results for Avon GullyHEIDEL, Janisha L (MRN [id number]) as of 10/13/2018 14:56  Ref. Range 10/03/2018 09:25  Scan Result Unknown 153     Assessment & Plan:    1. Frequency/urgency Not 100% at goal with Myrbetriq 25 mg daily, but hesitant to increase dose due to moderate residual She will restart her PT exercises at home and see if they improve her urinary symptoms and lower her residual RTC in one month for I PSS  and PVR  2. Cystocele See above   Return in about 1 month (around 11/02/2018) for PVR and OAB questionnaire.  These notes generated with voice recognition software. I apologize for typographical errors.  Michiel CowboySHANNON Imogine Carvell, PA-C  [facility] [address] [place], [place] [address] [phone]

## 2018-10-03 ENCOUNTER — Encounter: Payer: Self-pay | Admitting: Urology

## 2018-10-03 ENCOUNTER — Other Ambulatory Visit: Payer: Self-pay

## 2018-10-03 ENCOUNTER — Ambulatory Visit (INDEPENDENT_AMBULATORY_CARE_PROVIDER_SITE_OTHER): Payer: 59 | Admitting: Urology

## 2018-10-03 VITALS — BP 122/84 | HR 85 | Ht 69.0 in | Wt 196.5 lb

## 2018-10-03 DIAGNOSIS — R350 Frequency of micturition: Secondary | ICD-10-CM | POA: Diagnosis not present

## 2018-10-03 DIAGNOSIS — N8111 Cystocele, midline: Secondary | ICD-10-CM

## 2018-10-03 LAB — BLADDER SCAN AMB NON-IMAGING: Scan Result: 153

## 2018-10-06 DIAGNOSIS — F410 Panic disorder [episodic paroxysmal anxiety] without agoraphobia: Secondary | ICD-10-CM | POA: Diagnosis not present

## 2018-10-06 DIAGNOSIS — F332 Major depressive disorder, recurrent severe without psychotic features: Secondary | ICD-10-CM | POA: Diagnosis not present

## 2018-10-06 DIAGNOSIS — F900 Attention-deficit hyperactivity disorder, predominantly inattentive type: Secondary | ICD-10-CM | POA: Diagnosis not present

## 2018-10-08 DIAGNOSIS — F322 Major depressive disorder, single episode, severe without psychotic features: Secondary | ICD-10-CM | POA: Diagnosis not present

## 2018-10-21 DIAGNOSIS — F322 Major depressive disorder, single episode, severe without psychotic features: Secondary | ICD-10-CM | POA: Diagnosis not present

## 2018-10-27 NOTE — Progress Notes (Signed)
Gynecology Annual Exam  PCP: Olin Hauser, DO  Chief Complaint:  Chief Complaint  Patient presents with  . Gynecologic Exam    History of Present Illness: Carolyn [name] is a 43 y.o. G1 P1001 WF who presents for a NP annual gyn exam. She is transferring her care from [facility] in [place]. The patient complains of irregular menses and increased hot flashes. She was started on HRT in 2018 after experiencing amenorrhea x 1 year and having an elevated FSH. The hormone therapy was used primarily to see if it would help her mood as she was having increased depression and anxiety. Her psychiatrist changed her medications, her moods improved and she stopped the HRT after about 1 year. Her menses resumed spontaneously, but have been irregular.   Her menses are irregular, they usually occur every 1-2 months , and they last 7 days, however her last menstrual period was 5 months ago (05/11/2018). Her flow is light. She does not have intermenstrual bleeding. She denies dysmenorrhea. She has hot flashes.  Last pap smear: 09/10/2016, results were NIL. Remote hx of abnormal Pap with negative colposcopy   The patient is  sexually active. She currently uses BTL for contraception. She does not have dyspareunia.  Since her last annual 11/05/2017, she has had no significant changes in her health. 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  Her past medical history is remarkable for anxiety, depression, mixed urinary incontinence, and seasonal allergies.  The patient does not perform self breast exams. Her last mammogram was 11/05/2017, results were Bir[place] 1.   There is a family history of breast cancer in her paternal grandmother Genetic testing has not been done.  There is no family history of ovarian cancer.   The patient denies smoking.  She reports drinking alcohol. She reports have 3 drinks per week.  She denies illegal drug use.  She had a DEXA scan  11/06/2018 which revealed mild osteopenia in her left hip (T score -1.2, -1.4).   The patient does not exercise. She does get adequate calcium in her diet and she takes vitamin D3 supplements.  Last lipid panel by her PCP was abnormal. She is having a repeat panel in 3 days.   The patient reports current symptoms of depression.    Review of Systems: Review of Systems  Constitutional: Negative for chills, fever and weight loss.  HENT: Positive for congestion. Negative for sinus pain and sore throat.   Eyes: Negative for blurred vision and pain.  Respiratory: Negative for hemoptysis, shortness of breath and wheezing.   Cardiovascular: Negative for chest pain, palpitations and leg swelling.  Gastrointestinal: Positive for constipation (chronic). Negative for abdominal pain, blood in stool, diarrhea, heartburn, nausea and vomiting.  Genitourinary: Positive for frequency. Negative for dysuria, hematuria and urgency.       Positive for irregular menses and mixed incontinence  Musculoskeletal: Negative for back pain, joint pain and myalgias.  Skin: Negative for itching and rash.  Neurological: Negative for dizziness, tingling and headaches.  Endo/Heme/Allergies: Positive for environmental allergies. Negative for polydipsia. Does not bruise/bleed easily.       Positive for hot flashes   Psychiatric/Behavioral: Positive for depression. The patient is nervous/anxious and has insomnia.     Past Medical History:  Past Medical History:  Diagnosis Date  . Anemia    a. low-normal H/H  . Anxiety   . Arthritis    osteopenia  . Benign essential tremor  a. bilateral upper and lower extremities; b. followed by [facility] c. felt to be 2/2 antidepressants   . Carpal tunnel syndrome   . Deafness in left ear   . Depression   . Mixed incontinence   . Palpitations     Past Surgical History:  Past Surgical History:  Procedure Laterality Date  . axillary lymph node removal Right 1982   enlarged  lymph node  . IUD REMOVAL N/A 04/23/2014   Procedure: INTRAUTERINE DEVICE (IUD) REMOVAL;  Surgeon: Harold Hedge, MD;  Location: [facility];  Service: Gynecology;  Laterality: N/A;  . LAPAROSCOPIC TUBAL LIGATION Bilateral 04/23/2014   Procedure: LAPAROSCOPIC TUBAL LIGATION with Filshie Clips;  Surgeon: Harold Hedge, MD;  Location: [facility];  Service: Gynecology;  Laterality: Bilateral;    Family History:  Family History  Problem Relation Age of Onset  . Depression Mother        bipolar disorder  . Arthritis Mother        osteoprosis  . Heart disease Father   . Heart attack Father 27  . Heart disease Paternal Grandmother   . Cancer Paternal Grandmother        breast cancer  . Colon cancer Neg Hx   . Bladder Cancer Neg Hx   . Kidney cancer Neg Hx     Social History:  Social History   Socioeconomic History  . Marital status: Married    Spouse name: Not on file  . Number of children: 1  . Years of education: Not on file  . Highest education level: Not on file  Occupational History  . 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 Financial resource strain: Not on file  . Food insecurity    Worry: Not on file    Inability: Not on file  . Transportation needs    Medical: Not on file    Non-medical: Not on file  Tobacco Use  . Smoking status: Never Smoker  . Smokeless tobacco: Never Used  Substance and Sexual Activity  . Alcohol use: Yes    Comment: occ  . Drug use: No  . Sexual activity: Yes    Birth control/protection: Surgical  Lifestyle  . Physical activity    Days per week: Not on file    Minutes per session: Not on file  . Stress: Not on file  Relationships  . Social Musician on phone: Not on file    Gets together: Not on file    Attends religious service: Not on file    Active member of club or organization: Not on file    Attends meetings of clubs or organizations: Not on file    Relationship status: Not on file  .  Intimate partner violence    Fear of current or ex partner: Not on file    Emotionally abused: Not on file    Physically abused: Not on file    Forced sexual activity: Not on file  Other Topics Concern  . Not on file  Social History Narrative  . Not on file    Allergies:  Allergies  Allergen Reactions  . Latex Itching and Rash    sensitivity    Medications: Prior to Admission medications   Medication Sig Start Date End Date Taking? Authorizing Provider  cetirizine (ZYRTEC ALLERGY) 10 MG tablet     [provider]  Cholecalciferol (VITAMIN D3) 2000 units TABS Take 2,000 Units by mouth daily.    [provider]  clonazePAM (KLONOPIN) 0.5 MG tablet Take 0.5 mg by mouth 2 (two) times daily as needed for anxiety.    [provider]  guaiFENesin (MUCINEX) 600 MG 12 hr tablet     [provider]  lamoTRIgine (LAMICTAL) 200 MG tablet  07/02/17   [provider]  mirabegron ER (MYRBETRIQ) 25 MG TB24 tablet Take 1 tablet (25 mg total) by mouth daily. 10/30/17   Michiel CowboyMcGowan, Shannon A, PA-C  Multiple Vitamins-Minerals (MULTIVITAMIN PO) Take 1 tablet by mouth daily.    [provider]  primidone (MYSOLINE) 50 MG tablet Take 100 mg by mouth 2 (two) times daily.    [provider]  REXULTI 2 MG TABS Take 2 mg by mouth daily. 07/29/18   Karamalegos, Netta NeatAlexander J, DO  sertraline (ZOLOFT) 100 MG tablet Take 200 mg by mouth daily.    [provider]  traZODone (DESYREL) 100 MG tablet  04/12/17   [provider]  venlafaxine XR (EFFEXOR-XR) 150 MG 24 hr capsule  07/22/17   [provider]  vitamin E 1000 UNIT capsule  05/16/17   [provider]    Physical Exam Vitals: BP 118/74   Pulse 86   Ht 5\' 9"  (1.753 m)   Wt 197 lb (89.4 kg)   BMI 29.09 kg/m   General: WF in NAD HEENT: normocephalic, anicteric Neck: no thyroid enlargement, no palpable nodules, no cervical lymphadenopathy  Pulmonary: No increased  work of breathing, CTAB Cardiovascular: RRR, without murmur  Breast: Breast symmetrical, no tenderness, no palpable nodules or masses, no skin or nipple retraction present, no nipple discharge.  No axillary, infraclavicular or supraclavicular lymphadenopathy. Abdomen: Soft, non-tender, non-distended.  Umbilicus without lesions.  No hepatomegaly or masses palpable. No evidence of hernia. Genitourinary:  External: Normal external female genitalia.  Normal urethral meatus, normal Bartholin's and Skene's glands.    Vagina: Normal vaginal mucosa, no evidence of prolapse, clear mucoid discharge    Cervix: Grossly normal in appearance, no bleeding, non-tender  Uterus: Anteverted, normal size, shape, and consistency, mobile, and non-tender  Adnexa: No adnexal masses, non-tender  Rectal: deferred  Lymphatic: no evidence of inguinal lymphadenopathy Extremities: no edema, erythema, or tenderness Neurologic: Grossly intact Psychiatric: mood appropriate, affect full     Assessment: 43 y.o. G1 P1001 for NP annual gyn  Exam Perimenopausal bleeding pattern/ vasomotor symptoms  Declines HT at this time  Plan:   1) Breast cancer screening - recommend monthly self breast exam and annual screening mammograms.. Mammogram was ordered today. [facility].  2) Colon cancer screening: offered colon cancer screening via FIT test, Cologuard, or colonoscopy. She desires to postpone  3) Cervical cancer screening - Pap was done. ASCCP guidelines and rational discussed.  Patient opts for yearly screening interval  4) Contraception -BTL  5) Routine healthcare maintenance including cholesterol and diabetes screening managed by PCP   6) RTO in 1 year and prn.  Farrel Connersolleen Ercel Normoyle, CNM

## 2018-10-28 ENCOUNTER — Other Ambulatory Visit: Payer: Self-pay

## 2018-10-28 ENCOUNTER — Encounter: Payer: Self-pay | Admitting: Certified Nurse Midwife

## 2018-10-28 ENCOUNTER — Other Ambulatory Visit (HOSPITAL_COMMUNITY)
Admission: RE | Admit: 2018-10-28 | Discharge: 2018-10-28 | Disposition: A | Discharge status: Home / Self Care | Payer: 59 | Source: Ambulatory Visit | Attending: Certified Nurse Midwife | Admitting: Certified Nurse Midwife

## 2018-10-28 ENCOUNTER — Ambulatory Visit (INDEPENDENT_AMBULATORY_CARE_PROVIDER_SITE_OTHER): Payer: 59 | Admitting: Certified Nurse Midwife

## 2018-10-28 VITALS — BP 118/74 | HR 86 | Ht 69.0 in | Wt 197.0 lb

## 2018-10-28 DIAGNOSIS — Z124 Encounter for screening for malignant neoplasm of cervix: Secondary | ICD-10-CM | POA: Insufficient documentation

## 2018-10-28 DIAGNOSIS — N951 Menopausal and female climacteric states: Secondary | ICD-10-CM

## 2018-10-28 DIAGNOSIS — Z01419 Encounter for gynecological examination (general) (routine) without abnormal findings: Secondary | ICD-10-CM

## 2018-10-28 DIAGNOSIS — Z1231 Encounter for screening mammogram for malignant neoplasm of breast: Secondary | ICD-10-CM | POA: Diagnosis not present

## 2018-10-28 DIAGNOSIS — N926 Irregular menstruation, unspecified: Secondary | ICD-10-CM

## 2018-10-30 ENCOUNTER — Other Ambulatory Visit: Payer: Self-pay | Admitting: Urology

## 2018-10-30 DIAGNOSIS — N8111 Cystocele, midline: Secondary | ICD-10-CM

## 2018-10-30 DIAGNOSIS — R350 Frequency of micturition: Secondary | ICD-10-CM

## 2018-10-30 LAB — CYTOLOGY - PAP
Comment: NEGATIVE
Diagnosis: NEGATIVE
High risk HPV: NEGATIVE

## 2018-10-31 ENCOUNTER — Other Ambulatory Visit: Payer: 59

## 2018-10-31 ENCOUNTER — Other Ambulatory Visit: Payer: Self-pay

## 2018-10-31 DIAGNOSIS — E7800 Pure hypercholesterolemia, unspecified: Secondary | ICD-10-CM | POA: Diagnosis not present

## 2018-11-01 LAB — LIPID PANEL
Cholesterol: 211 mg/dL — ABNORMAL HIGH (ref <200)
HDL: 69 mg/dL (ref >=50)
LDL Cholesterol (Calc): 117 mg/dL (calc) — ABNORMAL HIGH
Non-HDL Cholesterol (Calc): 142 mg/dL (calc) — ABNORMAL HIGH (ref <130)
Total CHOL/HDL Ratio: 3.1 (calc) (ref <5.0)
Triglycerides: 137 mg/dL (ref <150)

## 2018-11-03 ENCOUNTER — Ambulatory Visit: Payer: 59 | Attending: Urology | Admitting: Physical Therapy

## 2018-11-03 ENCOUNTER — Other Ambulatory Visit: Payer: Self-pay

## 2018-11-03 DIAGNOSIS — R278 Other lack of coordination: Secondary | ICD-10-CM | POA: Insufficient documentation

## 2018-11-03 DIAGNOSIS — M62838 Other muscle spasm: Secondary | ICD-10-CM | POA: Diagnosis not present

## 2018-11-03 DIAGNOSIS — R29898 Other symptoms and signs involving the musculoskeletal system: Secondary | ICD-10-CM | POA: Insufficient documentation

## 2018-11-03 NOTE — Patient Instructions (Signed)
Open book to the R while lying on her L side  15 reps x 2 x day  ___  Deep core level 1  ( 10 reps)  2 x day  ___   At work every 1 hours  At the doorway, blocking R PSIS, rotating R slightly , feel doorway corner nudge behind scapula  15 reps  To increase R trunk rotation and counteract repeated L rotation at your computer

## 2018-11-04 NOTE — Therapy (Signed)
[place] MAIN [facility] SERVICES [place], [place], [address] Phone: [phone]   Fax:  [phone]  Physical Therapy Evaluation  Patient Details  Name: Carolyn [name] MRN: [id number] Date of Birth: 10/31/1975 No data recorded  Encounter Date: 11/03/2018  PT End of Session - 11/03/18 0829    Visit Number  1    Number of Visits  10    Date for PT Re-Evaluation  01/12/19    PT Start Time  0803    PT Stop Time  0900    PT Time Calculation (min)  57 min    Activity Tolerance  Patient tolerated treatment well    Behavior During Therapy  [facility] for tasks assessed/performed       Past Medical History:  Diagnosis Date  . Anemia    a. low-normal H/H  . Anxiety   . Arthritis    osteopenia  . Benign essential tremor    a. bilateral upper and lower extremities; b. followed by [facility]; c. felt to be 2/2 antidepressants   . Carpal tunnel syndrome   . Deafness in left ear   . Depression   . Mixed incontinence   . Palpitations     Past Surgical History:  Procedure Laterality Date  . axillary lymph node removal Right 1982   enlarged lymph node  . IUD REMOVAL N/A 04/23/2014   Procedure: INTRAUTERINE DEVICE (IUD) REMOVAL;  Surgeon: Everlene Farrier, MD;  Location: [place];  Service: Gynecology;  Laterality: N/A;  . LAPAROSCOPIC TUBAL LIGATION Bilateral 04/23/2014   Procedure: LAPAROSCOPIC TUBAL LIGATION with Filshie Clips;  Surgeon: Everlene Farrier, MD;  Location: [place] [facility];  Service: Gynecology;  Laterality: Bilateral;    There were no vitals filed for this visit.   Subjective Assessment - 11/03/18 0816    Subjective  Pt reports her SUI returned to a lesser degree after 6 months of completing pelvic PT.  Pt wears a pad all the time and did not notice when she leaked compared to post PT where she noticed when she leaked. Pt continued to take Mybetric the entire time pre PT, during PT, and post PT which has helped but pt wanted to discontinue taking  because it causes retention. The urine stream is verring to the R and occasionally to the L. Her bladder has to be so full before she has to go. Pt is able to make it to the bathroom in time before leakage. " It feels like I have to go all the time".  She drinks lots of water and goes 1 x every 2 hours.  Pt has to strain to get it all out and even then, it does not feel like it emptied.    In terms of prior issues that she came to PT for previously,her LBP, constipation, and L shoulder continues to be resolved. Pt had changed her desk station at work as recommended at prior PT sessions last year and notices her neck and shoulders are not as tight at the end of the day. Pt still has to turn L repeatedly at her desk . Pt feels tightness comes from her pillow now but has changed it over the weekend and she can tell a difference. 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    Pertinent History  Perimenopausal, osteopenia, anxiety and depression are being managed with medication, Gyn Hx: tubal ligation, episiotomy with first and only child, Physical activity: "very little". Pt prefers to  exercise with other people. Pt is not feeling motivated. Walking her dog a couple of blocks is the extent of her activity she reports.    Patient Stated Goals  being able to stop taking Mybertriq         [facility] - 11/04/18 0803      Observation/Other Assessments   Observations  less forward head and throacic kyphosis.  earlobe to acromium 21 cm B , R shoulder slightly lower than L       Coordination   Gross Motor Movements are Fluid and Coordinated  --   deep core coordination without cues, pelvic floor minor lift     Single Leg Stance   Comments  L SLS w/ perturbations after 20 sec, R SLS 30 reps with proper stability.  No trendeleberg       AROM   Overall AROM Comments  R trunk rotation 40% deg , L 60%        Strength   Overall Strength Comments  R hip flex 3/5, L 4+/5,  knee/ext B 5/5        Ambulation/Gait   Gait Comments  preTx: minimal upper T spine rotation, Post Tx, improved with arm swings                Objective measurements completed on examination: See above findings.      [facility] Adult PT Treatment/Exercise - 11/04/18 0803      Neuro Re-ed    Neuro Re-ed Details   alignment with new stretch at work breaks       Manual Therapy   Manual therapy comments  PA mob Grade III T3-10  , STM. medial scap mm                   PT Long Term Goals - 11/03/18 [id number]      PT LONG TERM GOAL #1   Title  Pt will decrease her NIH-CPSI score from 35 % to <17  % in order to improve pelvic floor function    Time  10    Period  Weeks    Status  New    Target Date  01/12/19      PT LONG TERM GOAL #2   Title  Pt will report dry pad by the end of the day across 5 days in order to be continent and maintain hygiene    Time  6    Period  Weeks    Status  New    Target Date  12/15/18      PT LONG TERM GOAL #3   Title  Pt will demo increased thoracic mobility in gait to improve mobility at diaphragm forb etter pelvic floor mobility.    Time  2    Period  Weeks    Status  New      PT LONG TERM GOAL #4   Title  earlobe to acromium 21 cm B to increase to 22 cm B to minimize forward head posture and optimize deep core function    Time  10    Period  Weeks    Status  New    Target Date  01/13/19      PT LONG TERM GOAL #5   Title  Pt will demo increased  R thoracic rotation from 40% to 60%  in order to minimze L posterior rotation of pelvis and promote better pelvic functions    Time  2    Period  Weeks  Status  New    Target Date  11/17/18      PT LONG TERM GOAL #6   Title  Pt will report complete emptying 100% of the time with urination to minimize risks for prolapse of the bladder    Time  8    Period  Weeks    Status  New    Target Date  12/29/18      PT LONG TERM GOAL #7   Title  --             Plan - 11/03/18  0830    Clinical Impression Statement Pt is a 43 yo female who returns to PT with report of urinary incontinence, incomplete emptying of urine, and deivated urine stream. Pt had completed Pelvic PT with good progress 10 months ago and her Sx with urinary incontinence improved. Pt continued to take Mybetric throughout this entire duration but pt would like to wean off this medication because she noticed it caused urinary retention. Pt was advised to communicate with her provider about her questions regarding weaning off her Mybertric medication.    In terms of prior issues that she came to PT for previously, her LBP, constipation, and L shoulder radicular pain continues to be resolved. Pt had changed her desk station at work as recommended at prior PT sessions last year and notices her neck and shoulders are not as tight at the end of the day. Pt still has to turn L repeatedly at her desk .  Today's assessment showed good carry over with spinal/ pelvic girdle alignment, less forward head posture, mm imbalance of back/neck/ shoulder, increased deep core coordination/ strength, and no more leg length difference nor asymmetrical pelvic floor mm tightness as seen on her evaluation last year. However, pt does still showed some degree of forward head posture along with thoracic spine hypomobility, limited R spinal rotation, limited arm swings in gait, R hip flexor strength weakness. Suspect pt's engagement with repeated L trunk rotation at work and her hobby with stain-glass art are associated with her limited thoracic hypomobility which restricts diaphragmatic excursion and consequently, restricts pelvic floor mobility. Plan to assess pelvic floor closely at anterior triangle at upcoming sessions. Following Tx today, pt demo'd increased arm swing in gait, more upper thoracic mobility and IND with work stretches that counteracts L trunk rotation , her repeated task at work. Pt benefits from skilled PT with use of a  regional interdependent approach to yield greater outcomes.      Examination-Activity Limitations  Toileting    Stability/Clinical Decision Making  Stable/Uncomplicated    Clinical Decision Making  Low    Rehab Potential  Good    PT Frequency  1x / week    PT Duration  --   10   PT Treatment/Interventions  ADLs/Self Care Home Management;Electrical Stimulation;Cryotherapy;Ultrasound;Traction;Moist Heat;Iontophoresis 4mg /ml Dexamethasone;Functional mobility training;Therapeutic activities;Therapeutic exercise;Patient/family education;Neuromuscular re-education;Manual techniques;Scar mobilization;Passive range of motion;Visual/perceptual remediation/compensation;Taping;Splinting;Energy conservation;Dry needling;Biofeedback;Aquatic Therapy;Manual lymph drainage;Balance training;Stair training    Consulted and Agree with Plan of Care  Patient       Patient will benefit from skilled therapeutic intervention in order to improve the following deficits and impairments:  Abnormal gait, Decreased activity tolerance, Decreased coordination, Decreased range of motion, Decreased strength, Hypomobility, Impaired flexibility, Impaired perceived functional ability, Impaired UE functional use, Postural dysfunction, Improper body mechanics, Pain, Decreased mobility, Decreased scar mobility, Increased muscle spasms  Visit Diagnosis: Other lack of coordination  Other symptoms and signs involving the musculoskeletal system  Other  muscle spasm     Problem List Patient Active Problem List   Diagnosis Date Noted  . Hyperlipidemia 05/17/2017  . Perimenopausal 02/13/2016  . 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 Numbness of right foot 11/29/2015  . Osteopenia 11/29/2015  . Vitamin D insufficiency 11/29/2015  . Palpitations   . Recurrent major depression in full remission (HCC)   . Deafness in left ear   . Anxiety 04/08/2013  . Benign essential tremor 04/08/2013  . Carpal tunnel  syndrome 04/08/2013    Mariane Masters ,PT, DPT, E-RYT  11/04/2018, 8:11 AM  [facility] MAIN [facility] SERVICES [address] [place], [place], [address] Phone: [phone]   Fax:  [phone]  Name: Carolyn [name] MRN: [id number] Date of Birth: 04-Feb-1975

## 2018-11-06 ENCOUNTER — Ambulatory Visit: Payer: 59 | Admitting: Urology

## 2018-11-12 ENCOUNTER — Ambulatory Visit: Payer: 59 | Attending: Urology | Admitting: Physical Therapy

## 2018-11-12 ENCOUNTER — Other Ambulatory Visit: Payer: Self-pay

## 2018-11-12 DIAGNOSIS — R293 Abnormal posture: Secondary | ICD-10-CM | POA: Diagnosis not present

## 2018-11-12 DIAGNOSIS — M62838 Other muscle spasm: Secondary | ICD-10-CM | POA: Diagnosis not present

## 2018-11-12 DIAGNOSIS — R278 Other lack of coordination: Secondary | ICD-10-CM | POA: Diagnosis not present

## 2018-11-12 DIAGNOSIS — R29898 Other symptoms and signs involving the musculoskeletal system: Secondary | ICD-10-CM | POA: Diagnosis not present

## 2018-11-12 NOTE — Patient Instructions (Addendum)
Hip flexor stretch  Hip flexor with a twist and shoulder roll backwards   Yoga strap at hips downward facing dog   Seated: twist    __   Practice proper pelvic floor coordination  Inhale: expand pelvic floor muscles Exhale" "j" scoop, allow pelvic floor to close, lift first before belly sinks   ( not "draw abdominal muscle to spine" or strain with abdominal muscles")

## 2018-11-12 NOTE — Therapy (Signed)
[facility] MAIN [facility] SERVICES [address] [place], [place], [address] Phone: [phone]   Fax:  [phone]  Physical Therapy Treatment  Patient Details  Name: Carolyn [name] MRN: [id number] Date of Birth: [birth year] No data recorded  Encounter Date: 11/12/2018  PT End of Session - 11/12/18 0911    Visit Number  2    Number of Visits  10    Date for PT Re-Evaluation  01/12/19    PT Start Time  0800    PT Stop Time  0855    PT Time Calculation (min)  55 min    Activity Tolerance  Patient tolerated treatment well    Behavior During Therapy  [facility] for tasks assessed/performed       Past Medical History:  Diagnosis Date  . Anemia    a. low-normal H/H  . Anxiety   . Arthritis    osteopenia  . Benign essential tremor    a. bilateral upper and lower extremities; b. followed by [facility]; c. felt to be 2/2 antidepressants   . Carpal tunnel syndrome   . Deafness in left ear   . Depression   . Mixed incontinence   . Palpitations     Past Surgical History:  Procedure Laterality Date  . axillary lymph node removal Right 1982   enlarged lymph node  . IUD REMOVAL N/A 04/23/2014   Procedure: INTRAUTERINE DEVICE (IUD) REMOVAL;  Surgeon: Harold Hedge, MD;  Location: [facility];  Service: Gynecology;  Laterality: N/A;  . LAPAROSCOPIC TUBAL LIGATION Bilateral 04/23/2014   Procedure: LAPAROSCOPIC TUBAL LIGATION with Filshie Clips;  Surgeon: Harold Hedge, MD;  Location: [facility];  Service: Gynecology;  Laterality: Bilateral;    There were no vitals filed for this visit.  Subjective Assessment - 11/12/18 0804    Subjective  Pt thinks she did it too zealously because she feels sore. Pt spoke to her urologist about stopping Mybetriq and she was advised to stop it cold [place]. That 3 days ago. Pt notioced yesterday l;eakage with coughing.    Pertinent History  Perimenopausal, osteopenia, anxiety and depression are being managed with medication, Gyn Hx: tubal  ligation, episiotomy with first and only child, Physical activity: "very little". Pt prefers to exercise with other people. Pt is not feeling motivated. Walking her dog a couple of blocks is the extent of her activity she reports.    Patient Stated Goals  being able to stop taking Mybertriq         [facility] PT Assessment - 11/12/18 0951      Coordination   Gross Motor Movements are Fluid and Coordinated  --   excessive ab overuse with deep core   Fine Motor Movements are Fluid and Coordinated  --   pelvic tilts w/ adductor/ ab overuse     Palpation   SI assessment   hypomobility at B SIJ, L > R, lacking counternutation                 Pelvic Floor Special Questions - 11/12/18 0912    External Perineal Exam  through clothing     External Palpation  excessive ab straining .  L ischiocavernosus tight ( improved post Tx)         [facility] Adult PT Treatment/Exercise - 11/12/18 5916      Neuro Re-ed    Neuro Re-ed Details   excessive cues , tactile and verbal for sequential co-activation of deep core, pelvic rocking without overuse of ab/  adductors       Modalities   Modalities  Moist Heat      Moist Heat Therapy   Number Minutes Moist Heat  5 Minutes    Moist Heat Location  --   sacrum, pelvic floor      Manual Therapy   Manual therapy comments  PA mob at proximal border of SIJ B. external release at L ischiocavernosus MWW                    PT Long Term Goals - 11/03/18 0354      PT LONG TERM GOAL #1   Title  Pt will decrease her NIH-CPSI score from 35 % to <17  % in order to improve pelvic floor function    Time  10    Period  Weeks    Status  New    Target Date  01/12/19      PT LONG TERM GOAL #2   Title  Pt will report dry pad by the end of the day across 5 days in order to be continent and maintain hygiene    Time  6    Period  Weeks    Status  New    Target Date  12/15/18      PT LONG TERM GOAL #3   Title  Pt will demo increased thoracic  mobility in gait to improve mobility at diaphragm forb etter pelvic floor mobility.    Time  2    Period  Weeks    Status  New      PT LONG TERM GOAL #4   Title  earlobe to acromium 21 cm B to increase to 22 cm B to minimize forward head posture and optimize deep core function    Time  10    Period  Weeks    Status  New    Target Date  01/13/19      PT LONG TERM GOAL #5   Title  Pt will demo increased  R thoracic rotation from 40% to 60%  in order to minimze L posterior rotation of pelvis and promote better pelvic functions    Time  2    Period  Weeks    Status  New    Target Date  11/17/18      PT LONG TERM GOAL #6   Title  Pt will report complete emptying 100% of the time with urination to minimize risks for prolapse of the bladder    Time  8    Period  Weeks    Status  New    Target Date  12/29/18      PT LONG TERM GOAL #7   Title  --            Plan - 11/12/18 0911    Clinical Impression Statement  Pt demo'd good carry over with decreased thoracic mm tightness from last session. Today focused on decreasing SIJ hypomobility, L ischiocavernosus mm, proper sequence with deep core to minimize overuse of adductor/ abdominal mm. Pt tolerated Tx and demo'd decreased mm tightness and proper coordination post training. Pt continues to benefit from skilled PT.    Examination-Activity Limitations  Toileting    Stability/Clinical Decision Making  Stable/Uncomplicated    Rehab Potential  Good    PT Frequency  1x / week    PT Duration  --   10   PT Treatment/Interventions  ADLs/Self Care Home Management;Electrical Stimulation;Cryotherapy;Ultrasound;Traction;Moist Heat;Iontophoresis 4mg /ml Dexamethasone;Functional mobility training;Therapeutic  activities;Therapeutic exercise;Patient/family education;Neuromuscular re-education;Manual techniques;Scar mobilization;Passive range of motion;Visual/perceptual remediation/compensation;Taping;Splinting;Energy conservation;Dry  needling;Biofeedback;Aquatic Therapy;Manual lymph drainage;Balance training;Stair training    Consulted and Agree with Plan of Care  Patient       Patient will benefit from skilled therapeutic intervention in order to improve the following deficits and impairments:  Abnormal gait, Decreased activity tolerance, Decreased coordination, Decreased range of motion, Decreased strength, Hypomobility, Impaired flexibility, Impaired perceived functional ability, Impaired UE functional use, Postural dysfunction, Improper body mechanics, Pain, Decreased mobility, Decreased scar mobility, Increased muscle spasms  Visit Diagnosis: Other lack of coordination  Other muscle spasm  Other symptoms and signs involving the musculoskeletal system     Problem List Patient Active Problem List   Diagnosis Date Noted  . Hyperlipidemia 05/17/2017  . Perimenopausal 02/13/2016  . 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 Numbness of right foot 11/29/2015  . Osteopenia 11/29/2015  . Vitamin D insufficiency 11/29/2015  . Palpitations   . Recurrent major depression in full remission (HCC)   . Deafness in left ear   . Anxiety 04/08/2013  . Benign essential tremor 04/08/2013  . Carpal tunnel syndrome 04/08/2013    Mariane MastersYeung,Shin Yiing ,PT, DPT, E-RYT  11/12/2018, 9:57 AM  [facility] [address] [place], [place], [address] Phone: [phone]   Fax:  [phone]  Name: Carolyn [name] MRN: [id number] Date of Birth: 07/02/1975

## 2018-11-18 DIAGNOSIS — F322 Major depressive disorder, single episode, severe without psychotic features: Secondary | ICD-10-CM | POA: Diagnosis not present

## 2018-11-19 ENCOUNTER — Ambulatory Visit: Payer: 59 | Admitting: Physical Therapy

## 2018-11-19 ENCOUNTER — Other Ambulatory Visit: Payer: Self-pay

## 2018-11-19 DIAGNOSIS — R29898 Other symptoms and signs involving the musculoskeletal system: Secondary | ICD-10-CM

## 2018-11-19 DIAGNOSIS — R278 Other lack of coordination: Secondary | ICD-10-CM | POA: Diagnosis not present

## 2018-11-19 DIAGNOSIS — R293 Abnormal posture: Secondary | ICD-10-CM | POA: Diagnosis not present

## 2018-11-19 DIAGNOSIS — M62838 Other muscle spasm: Secondary | ICD-10-CM

## 2018-11-19 NOTE — Patient Instructions (Addendum)
Stretch for pelvic floor   "v heels slide away and then back toward buttocks and then rock knee to slight ,  slide heel along at 11 o clock away from buttocks   10 reps    On belly: edge of mattress  knee bent like riding a horse, move knee towards armpit and out  10 reps  ___  Deep core level 1  ( breathing, notice the diference in movement of pelvic floor  Deep core level 2  ( 6 min)   ___   After walking stretches _ hip flexor ( foot on chair)  _hamstring ( straighten knee but not locked)   _quad stretch - standing w, grabbing ankle , knee back _calf stretch ( lunge position, back knee bends, lower heel)   _seated: figure -4  -seated: thigh cross over thigh with a twist towards knee    ____  Heel raises on both feet Navel forward more, soft knees, lower heel slow and not back  20 reps x 3 ( followed by calf stretches)     __  Cough with exhale/ squeeze

## 2018-11-20 NOTE — Therapy (Signed)
[place] [facility] MAIN [facility] [address] [place], [place], [address] Phone: [phone]   Fax:  [phone]  Physical Therapy Treatment  Patient Details  Name: Carolyn [name] MRN: [id number] Date of Birth: 02/16/1975 No data recorded  Encounter Date: 11/19/2018  PT End of Session - 11/20/18 1127    Visit Number  3    Number of Visits  10    Date for PT Re-Evaluation  01/12/19    PT Start Time  0800    PT Stop Time  0900    PT Time Calculation (min)  60 min    Activity Tolerance  Patient tolerated treatment well    Behavior During Therapy  [facility] for tasks assessed/performed       Past Medical History:  Diagnosis Date  . Anemia    a. low-normal H/H  . Anxiety   . Arthritis    osteopenia  . Benign essential tremor    a. bilateral upper and lower extremities; [facility]; c. felt to be 2/2 antidepressants   . Carpal tunnel syndrome   . Deafness in left ear   . Depression   . Mixed incontinence   . Palpitations     Past Surgical History:  Procedure Laterality Date  . axillary lymph node removal Right 1982   enlarged lymph node  . IUD REMOVAL N/A 04/23/2014   Procedure: INTRAUTERINE DEVICE (IUD) REMOVAL;  Surgeon: Harold HedgeJames Tomblin, MD;  Location: [facility];  Service: Gynecology;  Laterality: N/A;  . LAPAROSCOPIC TUBAL LIGATION Bilateral 04/23/2014   Procedure: LAPAROSCOPIC TUBAL LIGATION with Filshie Clips;  Surgeon: Harold HedgeJames Tomblin, MD;  Location: [facility];  Service: Gynecology;  Laterality: Bilateral;    There were no vitals filed for this visit.  Subjective Assessment - 11/19/18 0804    Subjective  Pt reported since she stopped the Mybertric, she noticed more pressure in the pelvic area when coughing and a little leakage. Pt does not necessarily feeling but she notices her liner will be wet    Pertinent History  Perimenopausal, osteopenia, anxiety and depression are being managed with medication, Gyn Hx: tubal ligation, episiotomy  with first and only child, Physical activity: "very little". Pt prefers to exercise with other people. Pt is not feeling motivated. Walking her dog a couple of blocks is the extent of her activity she reports.    Patient Stated Goals  being able to stop taking Mybertriq         [facility] - 11/20/18 1124      Observation/Other Assessments   Observations  L ankle with slightly more medial collapse than R      Strength   Overall Strength Comments  PF without UE support: R 3reps, MMT 4/5. L zero, instability on both. ( Post Tx: 7 reps on L).  DF/EV B 5/5          Palpation   Palpation comment  L hypomobility limited in DF/EV ( improved post Tx)                 Pelvic Floor Special Questions - 11/20/18 1128    External Perineal Exam  through clothing     External Palpation  L obt int thightness ( decreased post Tx)         [facility] Adult PT Treatment/Exercise - 11/20/18 1125      Therapeutic Activites    Therapeutic Activities  --   discussed foot placement when driving  Neuro Re-ed    Neuro Re-ed Details   cued for co-activation of transverse arch, deep core in heel raises and new stretches       Manual Therapy   Manual therapy comments  External: MWM /STM at L obt int,  AP/PA mob at midfoot joint to promote DF/EV L                   PT Long Term Goals - 11/20/18 1126      PT LONG TERM GOAL #1   Title  Pt will decrease her NIH-CPSI score from 35 % to <17  % in order to improve pelvic floor function    Time  10    Period  Weeks    Status  On-going      PT LONG TERM GOAL #2   Title  Pt will report dry pad by the end of the day across 5 days in order to be continent and maintain hygiene    Time  6    Period  Weeks    Status  On-going      PT LONG TERM GOAL #3   Title  Pt will demo increased thoracic mobility in gait to improve mobility at diaphragm forb etter pelvic floor mobility.    Time  2    Period  Weeks    Status  On-going       PT LONG TERM GOAL #4   Title  earlobe to acromium 21 cm B to increase to 22 cm B to minimize forward head posture and optimize deep core function    Time  10    Period  Weeks    Status  On-going      PT LONG TERM GOAL #5   Title  Pt will demo increased  R thoracic rotation from 40% to 60%  in order to minimze L posterior rotation of pelvis and promote better pelvic functions    Time  2    Period  Weeks    Status  On-going      PT LONG TERM GOAL #6   Title  Pt will report complete emptying 100% of the time with urination to minimize risks for prolapse of the bladder    Time  8    Period  Weeks    Status  On-going      PT LONG TERM GOAL #7   Title  Pt will demo increased PF strength without UE support on wall > 10 reps B in order to minimize overuse of pelvic floor and optimize lower kinetic chain in balanace and stepping    Time  10    Period  Weeks    Status  New    Target Date  01/29/19            Plan - 11/20/18 1127    Clinical Impression Statement  Pt continued to require external manual Tx to release L pelvic floor.  Today it was required at a deeper layer which showed good carry over from last session that was focused on superficial layers. Regional interdependent approach was applied. Assessment at L foot showed significantly more hypomobility ( restrictedpronation/ DF/EV) and manual Tx facilitated more mobility. Discussed her foot placement when driving as pt drives stick shift and places her foot on a platform when not on the clutch. Pt also showed weakness of plantarflexors and required need of hand support. Added PF strengthening. Pt showed improved posture with less forward head which is a  good carry over from past sessions. Pt continues to benefit from skilled PT.    Examination-Activity Limitations  Toileting    Stability/Clinical Decision Making  Stable/Uncomplicated    Rehab Potential  Good    PT Frequency  1x / week    PT Duration  --   10   PT  Treatment/Interventions  ADLs/Self Care Home Management;Electrical Stimulation;Cryotherapy;Ultrasound;Traction;Moist Heat;Iontophoresis 4mg /ml Dexamethasone;Functional mobility training;Therapeutic activities;Therapeutic exercise;Patient/family education;Neuromuscular re-education;Manual techniques;Scar mobilization;Passive range of motion;Visual/perceptual remediation/compensation;Taping;Splinting;Energy conservation;Dry needling;Biofeedback;Aquatic Therapy;Manual lymph drainage;Balance training;Stair training    Consulted and Agree with Plan of Care  Patient       Patient will benefit from skilled therapeutic intervention in order to improve the following deficits and impairments:  Abnormal gait, Decreased activity tolerance, Decreased coordination, Decreased range of motion, Decreased strength, Hypomobility, Impaired flexibility, Impaired perceived functional ability, Impaired UE functional use, Postural dysfunction, Improper body mechanics, Pain, Decreased mobility, Decreased scar mobility, Increased muscle spasms  Visit Diagnosis: Other lack of coordination  Other muscle spasm  Other symptoms and signs involving the musculoskeletal system     Problem List Patient Active Problem List   Diagnosis Date Noted  . Hyperlipidemia 05/17/2017  . Perimenopausal 02/13/2016  . 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 Numbness of right foot 11/29/2015  . Osteopenia 11/29/2015  . Vitamin D insufficiency 11/29/2015  . Palpitations   . Recurrent major depression in full remission (HCC)   . Deafness in left ear   . Anxiety 04/08/2013  . Benign essential tremor 04/08/2013  . Carpal tunnel syndrome 04/08/2013    06/08/2013 ,PT, DPT, E-RYT  11/20/2018, 11:31 AM  [place] [facility] MAIN [facility] SERVICES [address] [place], [place], [place] Phone: [phone]   Fax:  [phone]  Name: APURVA REILY MRN: Carolyn Gully Date of Birth:  01-31-1975

## 2018-11-24 ENCOUNTER — Ambulatory Visit: Payer: 59 | Admitting: Physical Therapy

## 2018-11-24 ENCOUNTER — Other Ambulatory Visit: Payer: Self-pay

## 2018-11-24 DIAGNOSIS — R293 Abnormal posture: Secondary | ICD-10-CM | POA: Diagnosis not present

## 2018-11-24 DIAGNOSIS — R29898 Other symptoms and signs involving the musculoskeletal system: Secondary | ICD-10-CM

## 2018-11-24 DIAGNOSIS — M62838 Other muscle spasm: Secondary | ICD-10-CM | POA: Diagnosis not present

## 2018-11-24 DIAGNOSIS — R278 Other lack of coordination: Secondary | ICD-10-CM

## 2018-11-24 NOTE — Patient Instructions (Signed)
Feet care :  Self -feet massage   Handshake : fingers between toes, moving ballmounds/toes back and forth several times while other hand anchors at arch. Do the same at the hind/mid foot.  Heel to toes upward to a letter Big Letter T strokes to spread ballmounds and toes, several times, pinch between webs of toes  Run finger tips along top of foot between long bones "comb between the bones"    Wiggle toes and spread them out when relaxing   ___    Spreading more transverse arches with heel raises   ___  coughing, sneezing, laughing with "J" scoop ( pelvic floor lift, low belly in not pushed out)

## 2018-11-24 NOTE — Therapy (Signed)
[place] MAIN [facility] SERVICES [place], [place], [address] Phone: [phone]   Fax:  [phone]  Physical Therapy Treatment  Patient Details  Name: Carolyn [name] MRN: [id number] Date of Birth: 1975-11-18 No data recorded  Encounter Date: 11/24/2018  PT End of Session - 11/24/18 0905    Visit Number  4    Number of Visits  10    Date for PT Re-Evaluation  01/12/19    Activity Tolerance  Patient tolerated treatment well    Behavior During Therapy  [facility] for tasks assessed/performed       Past Medical History:  Diagnosis Date  . Anemia    a. low-normal H/H  . Anxiety   . Arthritis    osteopenia  . Benign essential tremor    a. bilateral upper and lower extremities; b. followed by [facility]; c. felt to be 2/2 antidepressants   . Carpal tunnel syndrome   . Deafness in left ear   . Depression   . Mixed incontinence   . Palpitations     Past Surgical History:  Procedure Laterality Date  . axillary lymph node removal Right 1982   enlarged lymph node  . IUD REMOVAL N/A 04/23/2014   Procedure: INTRAUTERINE DEVICE (IUD) REMOVAL;  Surgeon: Everlene Farrier, MD;  Location: [place] [facility];  Service: Gynecology;  Laterality: N/A;  . LAPAROSCOPIC TUBAL LIGATION Bilateral 04/23/2014   Procedure: LAPAROSCOPIC TUBAL LIGATION with Filshie Clips;  Surgeon: Everlene Farrier, MD;  Location: [place] [facility];  Service: Gynecology;  Laterality: Bilateral;    There were no vitals filed for this visit.  Subjective Assessment - 11/24/18 0803    Subjective  Pt reported she has noticed she does not get a strong enough clearance of her throat when she tried to exhale before the pelvic floor squeeze with the cough    Pertinent History  Perimenopausal, osteopenia, anxiety and depression are being managed with medication, Gyn Hx: tubal ligation, episiotomy with first and only child, Physical activity: "very little". Pt prefers to exercise with other people. Pt is not feeling  motivated. Walking her dog a couple of blocks is the extent of her activity she reports.    Patient Stated Goals  being able to stop taking Mybertriq         [facility] PT Assessment - 11/24/18 0001      Coordination   Gross Motor Movements are Fluid and Coordinated  --   upper ab overuse, dyscoordination of pelvic floor / ab    Fine Motor Movements are Fluid and Coordinated  --   with simulated cough      Strength   Overall Strength Comments  PF without UE support: B 7reps, MMT 4/5      Palpation   Palpation comment  transverse arch tightness R > L,  hypomobility at cuneiform lateral and cuiboid L foot limited in DF/EV                  Pelvic Floor Special Questions - 11/24/18 3343    External Palpation  no tightness at obt int B         [facility] Adult PT Treatment/Exercise - 11/24/18 0855      Neuro Re-ed    Neuro Re-ed Details   cued for lower abdominal use not straining with simulated cough, sneezing, and laughing in upright position , with self- tactile cues        Modalities   Modalities  Moist Heat  Moist Heat Therapy   Number Minutes Moist Heat  5 Minutes    Moist Heat Location  --   feet     Manual Therapy   Manual therapy comments  STM/MWM at transerse arch mm ( adductor hallucis) ,  medial glide at L cuneform lateral/ cuboid                    PT Long Term Goals - 11/24/18 0905      PT LONG TERM GOAL #1   Title  Pt will decrease her NIH-CPSI score from 35 % to <17  % in order to improve pelvic floor function    Time  10    Period  Weeks    Status  On-going      PT LONG TERM GOAL #2   Title  Pt will report dry pad by the end of the day across 5 days in order to be continent and maintain hygiene    Time  6    Period  Weeks    Status  On-going      PT LONG TERM GOAL #3   Title  Pt will demo increased thoracic mobility in gait to improve mobility at diaphragm forb etter pelvic floor mobility.    Time  2    Period  Weeks    Status   Achieved      PT LONG TERM GOAL #4   Title  earlobe to acromium 21 cm B to increase to 22 cm B to minimize forward head posture and optimize deep core function    Time  10    Period  Weeks    Status  Partially Met      PT LONG TERM GOAL #5   Title  Pt will demo increased  R thoracic rotation from 40% to 60%  in order to minimze L posterior rotation of pelvis and promote better pelvic functions    Time  2    Period  Weeks    Status  Achieved      PT LONG TERM GOAL #6   Title  Pt will report complete emptying 100% of the time with urination to minimize risks for prolapse of the bladder    Time  8    Period  Weeks    Status  On-going      PT LONG TERM GOAL #7   Title  Pt will demo increased PF strength without UE support on wall > 10 reps B in order to minimize overuse of pelvic floor and optimize lower kinetic chain in balanace and stepping    Time  10    Period  Weeks    Status  On-going      PT LONG TERM GOAL #8   Title  Pt will demo proper coordination of pelvic floor low abdominal mm co-activation with simulated cough without straining/ pushing of abdomen outward and without cues required in order to minimize SUI    Time  5    Period  Weeks    Status  New    Target Date  12/29/18            Plan - 11/24/18 0900    Clinical Impression Statement  Pt is progressing well with good carry over with increased thoracic mobility, less forward heard with self-correction of cervical retraction. Pt demo'd no more pelvic floor tighness with regional interdependent approach the past two sessions which focused on minimizing intrinsic feet mm tightness, decreased strength  in PF, hypombility in mid/foorefoot. Pt demo'd increasing strength in PF today and mobility into DF/EV and less supination. Pt demo'd dyscoordination of pelvic flor and abdominal straining with simulated cough which is associated with her SUI.  Pt showed proper technique post Tx. Pt continues to benefit from skilled PT.      Examination-Activity Limitations  Toileting    Stability/Clinical Decision Making  Stable/Uncomplicated    Rehab Potential  Good    PT Frequency  1x / week    PT Duration  --   10   PT Treatment/Interventions  ADLs/Self Care Home Management;Electrical Stimulation;Cryotherapy;Ultrasound;Traction;Moist Heat;Iontophoresis 42m/ml Dexamethasone;Functional mobility training;Therapeutic activities;Therapeutic exercise;Patient/family education;Neuromuscular re-education;Manual techniques;Scar mobilization;Passive range of motion;Visual/perceptual remediation/compensation;Taping;Splinting;Energy conservation;Dry needling;Biofeedback;Aquatic Therapy;Manual lymph drainage;Balance training;Stair training    Consulted and Agree with Plan of Care  Patient       Patient will benefit from skilled therapeutic intervention in order to improve the following deficits and impairments:  Abnormal gait, Decreased activity tolerance, Decreased coordination, Decreased range of motion, Decreased strength, Hypomobility, Impaired flexibility, Impaired perceived functional ability, Impaired UE functional use, Postural dysfunction, Improper body mechanics, Pain, Decreased mobility, Decreased scar mobility, Increased muscle spasms  Visit Diagnosis: Other lack of coordination  Other muscle spasm  Other symptoms and signs involving the musculoskeletal system  Abnormal posture     Problem List Patient Active Problem List   Diagnosis Date Noted  . Hyperlipidemia 05/17/2017  . Perimenopausal 02/13/2016  . 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 Numbness of right foot 11/29/2015  . Osteopenia 11/29/2015  . Vitamin D insufficiency 11/29/2015  . Palpitations   . Recurrent major depression in full remission ([facility]   . Deafness in left ear   . Anxiety 04/08/2013  . Benign essential tremor 04/08/2013  . Carpal tunnel syndrome 04/08/2013    YJerl Mina,PT, DPT, E-RYT  11/24/2018, 9:07  AM  [place] [facility] [address] [place] [address]Phone: [phone]  Fax:  [phone] Name: Carolyn CULMERMRN: [id number] of Birth: [birth year]

## 2018-11-27 DIAGNOSIS — G250 Essential tremor: Secondary | ICD-10-CM | POA: Diagnosis not present

## 2018-12-01 DIAGNOSIS — F410 Panic disorder [episodic paroxysmal anxiety] without agoraphobia: Secondary | ICD-10-CM | POA: Diagnosis not present

## 2018-12-01 DIAGNOSIS — F900 Attention-deficit hyperactivity disorder, predominantly inattentive type: Secondary | ICD-10-CM | POA: Diagnosis not present

## 2018-12-01 DIAGNOSIS — F332 Major depressive disorder, recurrent severe without psychotic features: Secondary | ICD-10-CM | POA: Diagnosis not present

## 2018-12-02 DIAGNOSIS — F322 Major depressive disorder, single episode, severe without psychotic features: Secondary | ICD-10-CM | POA: Diagnosis not present

## 2018-12-09 ENCOUNTER — Other Ambulatory Visit: Payer: Self-pay

## 2018-12-09 ENCOUNTER — Ambulatory Visit: Payer: 59 | Attending: Urology | Admitting: Physical Therapy

## 2018-12-09 DIAGNOSIS — R293 Abnormal posture: Secondary | ICD-10-CM | POA: Diagnosis not present

## 2018-12-09 DIAGNOSIS — R29898 Other symptoms and signs involving the musculoskeletal system: Secondary | ICD-10-CM | POA: Insufficient documentation

## 2018-12-09 DIAGNOSIS — R278 Other lack of coordination: Secondary | ICD-10-CM | POA: Insufficient documentation

## 2018-12-09 DIAGNOSIS — M62838 Other muscle spasm: Secondary | ICD-10-CM | POA: Insufficient documentation

## 2018-12-09 DIAGNOSIS — R279 Unspecified lack of coordination: Secondary | ICD-10-CM | POA: Insufficient documentation

## 2018-12-10 NOTE — Therapy (Signed)
[place] MAIN [facility] SERVICES [place], [place], [address] Phone: [phone]   Fax:  [phone]  Physical Therapy Treatment  Patient Details  Name: Carolyn [name] MRN: [id number] Date of Birth: Mar 19, 1975 No data recorded  Encounter Date: 12/09/2018  PT End of Session - 12/10/18 8466    Visit Number  5    Number of Visits  10    Date for PT Re-Evaluation  01/12/19    PT Start Time  0902    PT Stop Time  1000    PT Time Calculation (min)  58 min    Activity Tolerance  Patient tolerated treatment well    Behavior During Therapy  [facility] for tasks assessed/performed       Past Medical History:  Diagnosis Date  . Anemia    a. low-normal H/H  . Anxiety   . Arthritis    osteopenia  . Benign essential tremor    a. bilateral upper and lower extremities; b. followed by [facility]; c. felt to be 2/2 antidepressants   . Carpal tunnel syndrome   . Deafness in left ear   . Depression   . Mixed incontinence   . Palpitations     Past Surgical History:  Procedure Laterality Date  . axillary lymph node removal Right 1982   enlarged lymph node  . IUD REMOVAL N/A 04/23/2014   Procedure: INTRAUTERINE DEVICE (IUD) REMOVAL;  Surgeon: Everlene Farrier, MD;  Location: [facility];  Service: Gynecology;  Laterality: N/A;  . LAPAROSCOPIC TUBAL LIGATION Bilateral 04/23/2014   Procedure: LAPAROSCOPIC TUBAL LIGATION with Filshie Clips;  Surgeon: Everlene Farrier, MD;  Location: [place] [facility];  Service: Gynecology;  Laterality: Bilateral;    There were no vitals filed for this visit.  Subjective Assessment - 12/09/18 0924    Subjective  Pt notices she is not quite completely emptying her bladder and sometimes has to strain. When coughing, if she does not squeeze upward, there is a feeling of pressure and a lowered sensation.   Pt notices she can stand longer on one leg when drying her foot after showering. Going up stairs to her bedroom, sometiems there is clicking in the  L hip. Pt had a long episiotomy with her 1st and only birth 20 years ago.    Pertinent History  Perimenopausal, osteopenia, anxiety and depression are being managed with medication, Gyn Hx: tubal ligation, episiotomy with first and only child, Physical activity: "very little". Pt prefers to exercise with other people. Pt is not feeling motivated. Walking her dog a couple of blocks is the extent of her activity she reports.    Patient Stated Goals  being able to stop taking Mybertriq         [facility] PT Assessment - 12/10/18 0929      Observation/Other Assessments   Observations  L ankle without medial collapse than R.  Step up, decreased stance on L with more difficulty up and down without UE support     Skin Integrity         AROM   Overall AROM Comments  Hip ext 20 deg B standing       Strength   Overall Strength Comments  PF without UE support:L 5reps, R 8 reps , MMT 4/5      Palpation   Palpation comment  hypomobilty at L navicular/lateral/medial cuneiform, R dorsal aspect DAB muscles between digit II-III         Ambulation/Gait   Gait Comments  short strides, minimal hip flexion, push off ( post Tx : increaesd stride length, increased hip flexion, push off                Pelvic Floor Special Questions - 12/10/18 0929    External Perineal Exam  without clothing     External Palpation  increased tightness at R transverse perineal,  bulbospongiosus, L bulbospongious, uretha compressae, ischiocavernosus/ deep transverse perineal mm ( Post Tx: decreased tightness)         [facility] Adult PT Treatment/Exercise - 12/10/18 0929      Therapeutic Activites    Therapeutic Activities  --   gait training, toileting position to minimize pushing     Neuro Re-ed    Neuro Re-ed Details   cued for gait mechanics for less pelvic floor overactivity, more lower kinetic chain co-activation       Modalities   Modalities  Moist Heat      Moist Heat Therapy   Number Minutes Moist Heat  5  Minutes    Moist Heat Location  --   perineum ( througg pillow case/ sheet)      Manual Therapy   Manual therapy comments  External: STM/MWM  R transverse perineal,  bulbospongiosus, L bulbospongious, uretha compressae, ischiocavernosus/ deep transverse perineal mm                   PT Long Term Goals - 11/24/18 0905      PT LONG TERM GOAL #1   Title  Pt will decrease her NIH-CPSI score from 35 % to <17  % in order to improve pelvic floor function    Time  10    Period  Weeks    Status  On-going      PT LONG TERM GOAL #2   Title  Pt will report dry pad by the end of the day across 5 days in order to be continent and maintain hygiene    Time  6    Period  Weeks    Status  On-going      PT LONG TERM GOAL #3   Title  Pt will demo increased thoracic mobility in gait to improve mobility at diaphragm forb etter pelvic floor mobility.    Time  2    Period  Weeks    Status  Achieved      PT LONG TERM GOAL #4   Title  earlobe to acromium 21 cm B to increase to 22 cm B to minimize forward head posture and optimize deep core function    Time  10    Period  Weeks    Status  Partially Met      PT LONG TERM GOAL #5   Title  Pt will demo increased  R thoracic rotation from 40% to 60%  in order to minimze L posterior rotation of pelvis and promote better pelvic functions    Time  2    Period  Weeks    Status  Achieved      PT LONG TERM GOAL #6   Title  Pt will report complete emptying 100% of the time with urination to minimize risks for prolapse of the bladder    Time  8    Period  Weeks    Status  On-going      PT LONG TERM GOAL #7   Title  Pt will demo increased PF strength without UE support on wall > 10 reps B in order to  minimize overuse of pelvic floor and optimize lower kinetic chain in balanace and stepping    Time  10    Period  Weeks    Status  On-going      PT LONG TERM GOAL #8   Title  Pt will demo proper coordination of pelvic floor low abdominal mm  co-activation with simulated cough without straining/ pushing of abdomen outward and without cues required in order to minimize SUI    Time  5    Period  Weeks    Status  New    Target Date  12/29/18            Plan - 12/10/18 3010    Clinical Impression Statement  Pt showed significantly increased feet mobility, less supination and stronger push off and stride length in gait post Tx today. Focused on decreasing pelvic floor mm tightness at the anterior mm w/ external manual technique to minimize straining with initiating urine. Pt demo'd increased pelvic excursion/ contraction post Tx. Plan to progress with lower kinetic chain strengthening at upcoming session to minimize overuse of pelvic floor mm.  Pt continues to benefit from skilled PT.    Examination-Activity Limitations  Toileting    Stability/Clinical Decision Making  Stable/Uncomplicated    Rehab Potential  Good    PT Frequency  1x / week    PT Duration  --   10   PT Treatment/Interventions  ADLs/Self Care Home Management;Electrical Stimulation;Cryotherapy;Ultrasound;Traction;Moist Heat;Iontophoresis 37m/ml Dexamethasone;Functional mobility training;Therapeutic activities;Therapeutic exercise;Patient/family education;Neuromuscular re-education;Manual techniques;Scar mobilization;Passive range of motion;Visual/perceptual remediation/compensation;Taping;Splinting;Energy conservation;Dry needling;Biofeedback;Aquatic Therapy;Manual lymph drainage;Balance training;Stair training    Consulted and Agree with Plan of Care  Patient       Patient will benefit from skilled therapeutic intervention in order to improve the following deficits and impairments:  Abnormal gait, Decreased activity tolerance, Decreased coordination, Decreased range of motion, Decreased strength, Hypomobility, Impaired flexibility, Impaired perceived functional ability, Impaired UE functional use, Postural dysfunction, Improper body mechanics, Pain, Decreased  mobility, Decreased scar mobility, Increased muscle spasms  Visit Diagnosis: Other muscle spasm  Other symptoms and signs involving the musculoskeletal system  Other lack of coordination     Problem List Patient Active Problem List   Diagnosis Date Noted  . Hyperlipidemia 05/17/2017  . Perimenopausal 02/13/2016  . 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 Numbness of right foot 11/29/2015  . Osteopenia 11/29/2015  . Vitamin D insufficiency 11/29/2015  . Palpitations   . Recurrent major depression in full remission (HDelta   . Deafness in left ear   . Anxiety 04/08/2013  . Benign essential tremor 04/08/2013  . Carpal tunnel syndrome 04/08/2013    YJerl Mina,PT, DPT, E-RYT  12/10/2018, 9:42 AM  [place] [facility] [address] [place] [address]Phone: [phone]  Fax:  [phone] Name: AELLYSE ROTOLOMRN: [id number] of Birth: [birth year]

## 2018-12-10 NOTE — Patient Instructions (Signed)
Step up on R, then L  Step down on R then L  20 reps each at bottom of your stairs   Progress to single leg heel raises instead of both feet  30 reps  Stretch calves afterwards   Walking with longer stride, higher knees, like a bicycle    Sitting on toilet with raised stool or squatty potty Breathing to relax pelvic floor

## 2018-12-16 ENCOUNTER — Ambulatory Visit
Admission: RE | Admit: 2018-12-16 | Discharge: 2018-12-16 | Disposition: A | Discharge status: Home / Self Care | Payer: 59 | Source: Ambulatory Visit | Attending: Certified Nurse Midwife | Admitting: Certified Nurse Midwife

## 2018-12-16 DIAGNOSIS — Z1231 Encounter for screening mammogram for malignant neoplasm of breast: Secondary | ICD-10-CM | POA: Insufficient documentation

## 2018-12-16 DIAGNOSIS — F322 Major depressive disorder, single episode, severe without psychotic features: Secondary | ICD-10-CM | POA: Diagnosis not present

## 2018-12-16 NOTE — Progress Notes (Signed)
12/17/2018 11:42 AM   Carolyn [name] 10/10/1975 [id number]  Referring provider: Smitty CordsKaramalegos, Alexander J, DO [address] [place],  [place] [id number]  Chief Complaint  Patient presents with  . Urinary Frequency    38month    HPI: Patient is a 43 year old female who presents today for follow up for OAB and cystocele.  The patient is  experiencing urgency x 0-3 (unchanged), frequency x 4-7 (unchanged), not restricting fluids to avoid visits to the restroom, not engaging in toilet mapping, incontinence x 4-7 (worse) and nocturia x  0-3 (unchanged).   Her BP is 132/78.   Her PVR is 0 mL.    She is experiencing frequency, incontinence, straining to urinate, weak stream and constipation.  She is wearing pads and noticed them getting wet without awareness.   Patient denies any gross hematuria, dysuria or suprapubic/flank pain.  Patient denies any fevers, chills, nausea or vomiting.   She had some improvement on Myrbetriq, but she was having an increase in her residuals and more to strain to urinate, so we have discontinued that medication.    She is not on OAB at this time.  She is has continues PT at this time.    PMH: Past Medical History:  Diagnosis Date  . Anemia    a. low-normal H/H  . Anxiety   . Arthritis    osteopenia  . Benign essential tremor    a. bilateral upper and lower extremities; [facility]; c. felt to be 2/2 antidepressants   . Carpal tunnel syndrome   . Deafness in left ear   . Depression   . Mixed incontinence   . Palpitations     Surgical History: Past Surgical History:  Procedure Laterality Date  . axillary lymph node removal Right 1982   enlarged lymph node  . IUD REMOVAL N/A 04/23/2014   Procedure: INTRAUTERINE DEVICE (IUD) REMOVAL;  Surgeon: Harold HedgeJames Tomblin, MD;  Location: [facility];  Service: Gynecology;  Laterality: N/A;  . LAPAROSCOPIC TUBAL LIGATION Bilateral 04/23/2014   Procedure: LAPAROSCOPIC TUBAL LIGATION with Filshie Clips;  Surgeon: Harold HedgeJames  Tomblin, MD;  Location: [facility];  Service: Gynecology;  Laterality: Bilateral;    Home Medications:  Allergies as of 12/17/2018      Reactions   Latex Itching, Rash   sensitivity      Medication List       Accurate as of December 17, 2018 11:42 AM. If you have any questions, ask your nurse or doctor.        STOP taking these medications   mirabegron ER 25 MG Tb24 tablet Commonly known as: MYRBETRIQ Stopped by: Paylin Hailu, PA-C   Mucinex 600 MG 12 hr tablet Generic drug: guaiFENesin Stopped by: Zebastian Carico, PA-C   vitamin E 1000 UNIT capsule Stopped by: Ashtian Villacis, PA-C   ZyrTEC Allergy 10 MG tablet Generic drug: cetirizine Stopped by: Michiel CowboySHANNON Wolfgang Finigan, PA-C     TAKE these medications   clonazePAM 0.5 MG tablet Commonly known as: KLONOPIN Take 0.5 mg by mouth 2 (two) times daily as needed for anxiety.   lamoTRIgine 200 MG tablet Commonly known as: LAMICTAL   MULTIVITAMIN PO Take 1 tablet by mouth daily.   primidone 50 MG tablet Commonly known as: MYSOLINE Take 100 mg by mouth 2 (two) times daily.   Rexulti 3 MG Tabs Generic drug: Brexpiprazole Take 1 tablet by mouth daily.   sertraline 100 MG tablet Commonly known as: ZOLOFT Take 200 mg by mouth daily.  traZODone 100 MG tablet Commonly known as: DESYREL   venlafaxine XR 150 MG 24 hr capsule Commonly known as: EFFEXOR-XR   Vitamin D3 50 MCG (2000 UT) Tabs Take 2,000 Units by mouth daily.       Allergies:  Allergies  Allergen Reactions  . Latex Itching and Rash    sensitivity    Family History: Family History  Problem Relation Age of Onset  . Depression Mother        bipolar disorder  . Arthritis Mother        osteoprosis  . Heart disease Father   . Heart attack Father 69  . Heart disease Paternal Grandmother   . Cancer Paternal Grandmother        breast cancer  . Colon cancer Neg Hx   . Bladder Cancer Neg Hx   . Kidney cancer Neg Hx     Social History:  reports that  she has never smoked. She has never used smokeless tobacco. She reports current alcohol use of about 3.0 standard drinks of alcohol per week. She reports that she does not use drugs.  ROS: UROLOGY Frequent Urination?: Yes Hard to postpone urination?: No Burning/pain with urination?: No Get up at night to urinate?: No Leakage of urine?: Yes Urine stream starts and stops?: No Trouble starting stream?: No Do you have to strain to urinate?: Yes Blood in urine?: No Urinary tract infection?: No Sexually transmitted disease?: No Injury to kidneys or bladder?: No Painful intercourse?: No Weak stream?: Yes Currently pregnant?: No Vaginal bleeding?: No Last menstrual period?: n  Gastrointestinal Nausea?: No Vomiting?: No Indigestion/heartburn?: No Diarrhea?: No Constipation?: Yes  Constitutional Fever: No Night sweats?: No Weight loss?: No Fatigue?: No  Skin Skin rash/lesions?: No Itching?: No  Eyes Blurred vision?: No Double vision?: No  Ears/Nose/Throat Sore throat?: No Sinus problems?: No  Hematologic/Lymphatic Swollen glands?: No Easy bruising?: No  Cardiovascular Leg swelling?: No Chest pain?: No  Respiratory Cough?: No Shortness of breath?: No  Endocrine Excessive thirst?: No  Musculoskeletal Back pain?: No Joint pain?: No  Neurological Headaches?: No Dizziness?: No  Psychologic Depression?: Yes Anxiety?: Yes  Physical Exam: BP 132/78   Pulse (!) 111   Ht 5\' 9"  (1.753 m)   Wt 198 lb (89.8 kg)   BMI 29.24 kg/m   Constitutional:  Well nourished. Alert and oriented, No acute distress. HEENT: [place] AT, moist mucus membranes.  Trachea midline, no masses. Cardiovascular: No clubbing, cyanosis, or edema. Respiratory: Normal respiratory effort, no increased work of breathing. Neurologic: Grossly intact, no focal deficits, moving all 4 extremities. Psychiatric: Normal mood and affect.   Laboratory Data: Lab Results  Component Value Date   WBC  5.6 07/22/2018   HGB 12.5 07/22/2018   HCT 37.5 07/22/2018   MCV 98.4 07/22/2018   PLT 316 07/22/2018    Lab Results  Component Value Date   CREATININE 0.66 07/22/2018    No results found for: PSA  No results found for: TESTOSTERONE  Lab Results  Component Value Date   HGBA1C 5.1 07/22/2018    Lab Results  Component Value Date   TSH 0.64 07/22/2018       Component Value Date/Time   CHOL 211 (H) 10/31/2018 0757   HDL 69 10/31/2018 0757   CHOLHDL 3.1 10/31/2018 0757   VLDL 34 07/28/2015 0757   LDLCALC 117 (H) 10/31/2018 0757    Lab Results  Component Value Date   AST 27 07/22/2018   Lab Results  Component Value Date  ALT 17 07/22/2018   No components found for: ALKALINEPHOPHATASE No components found for: BILIRUBINTOTAL  No results found for: ESTRADIOL  I have reviewed the labs.   Pertinent Imaging: Results for AUDRE, CENCI (MRN [id number]) as of 12/17/2018 11:40  Ref. Range 12/17/2018 08:32  Scan Result Unknown 35ml    Assessment & Plan:    1. Incontinence She states she is noticing her pads becoming wet without her awareness.  She feels that the leakage may be stress induced, but she cannot be sure. I will refer her on for urodynamic studies so that we may get a better understanding of her bladder functions. I explained to the patient that urodynamics is a study that assesses how the bladder and urethra are performing their job of storing and releasing urine.  Urodynamic tests can help explain symptoms such as: incontinence, frequent urination, urgency, hesitancy, dysuria, urinary retention and/or recurrent UTI's.  To perform the test, special catheter is placed into the urethra, a rectal catheter is placed and electrodes are placed around the perineum.  The bladder is filled with water and the patient will be asked to void, it they can.  There may be a video component involved at some center, where the bladder activity is monitored by a video  2.  Cystocele See above   Return for refer to [facility] for UDS .  These notes generated with voice recognition software. I apologize for typographical errors.  Zara Council, PA-C  [facility] [address] [place], [place] [address] [phone]

## 2018-12-17 ENCOUNTER — Other Ambulatory Visit: Payer: Self-pay

## 2018-12-17 ENCOUNTER — Encounter: Payer: Self-pay | Admitting: Urology

## 2018-12-17 ENCOUNTER — Ambulatory Visit (INDEPENDENT_AMBULATORY_CARE_PROVIDER_SITE_OTHER): Payer: 59 | Admitting: Urology

## 2018-12-17 VITALS — BP 132/78 | HR 111 | Ht 69.0 in | Wt 198.0 lb

## 2018-12-17 DIAGNOSIS — R350 Frequency of micturition: Secondary | ICD-10-CM | POA: Diagnosis not present

## 2018-12-17 DIAGNOSIS — N3946 Mixed incontinence: Secondary | ICD-10-CM | POA: Diagnosis not present

## 2018-12-17 DIAGNOSIS — N8111 Cystocele, midline: Secondary | ICD-10-CM

## 2018-12-17 LAB — BLADDER SCAN AMB NON-IMAGING

## 2018-12-23 ENCOUNTER — Other Ambulatory Visit: Payer: Self-pay

## 2018-12-23 ENCOUNTER — Ambulatory Visit: Payer: 59 | Admitting: Physical Therapy

## 2018-12-23 DIAGNOSIS — R29898 Other symptoms and signs involving the musculoskeletal system: Secondary | ICD-10-CM

## 2018-12-23 DIAGNOSIS — R278 Other lack of coordination: Secondary | ICD-10-CM | POA: Diagnosis not present

## 2018-12-23 DIAGNOSIS — M62838 Other muscle spasm: Secondary | ICD-10-CM | POA: Diagnosis not present

## 2018-12-23 DIAGNOSIS — R279 Unspecified lack of coordination: Secondary | ICD-10-CM | POA: Diagnosis not present

## 2018-12-23 DIAGNOSIS — R293 Abnormal posture: Secondary | ICD-10-CM

## 2018-12-23 NOTE — Patient Instructions (Signed)
Maintain Stretches   ____  When doing Deep core level 1 and 2   1) Pillow under hips  2)  Practice proper pelvic floor coordination  Inhale: expand pelvic floor muscles Exhale" "j" scoop, allow pelvic floor to close, lift first before belly sinks   ( not "draw abdominal muscle to spine" or strain with abdominal muscles")    ____  Step ups on R, L, step down R slowly then L  30 reps  ____  Work break:   heel lifts, jostle heels down to shake loose body to soften relax body with lymph flow aftersitting for long periods and to not overuseadductors

## 2018-12-23 NOTE — Therapy (Signed)
[place] MAIN [facility] SERVICES [place], [place], [address] Phone: [phone]   Fax:  [phone]  Physical Therapy Treatment  Patient Details  Name: Carolyn [name] MRN: [id number] Date of Birth: 20-Aug-1975 No data recorded  Encounter Date: 12/23/2018  PT End of Session - 12/23/18 1056    Visit Number  6    Number of Visits  10    Date for PT Re-Evaluation  01/12/19    PT Start Time  0900    PT Stop Time  1000    PT Time Calculation (min)  60 min    Activity Tolerance  Patient tolerated treatment well    Behavior During Therapy  [facility] for tasks assessed/performed       Past Medical History:  Diagnosis Date  . Anemia    a. low-normal H/H  . Anxiety   . Arthritis    osteopenia  . Benign essential tremor    a. bilateral upper and lower extremities; b. followed by [facility]; c. felt to be 2/2 antidepressants   . Carpal tunnel syndrome   . Deafness in left ear   . Depression   . Mixed incontinence   . Palpitations     Past Surgical History:  Procedure Laterality Date  . axillary lymph node removal Right 1982   enlarged lymph node  . IUD REMOVAL N/A 04/23/2014   Procedure: INTRAUTERINE DEVICE (IUD) REMOVAL;  Surgeon: Everlene Farrier, MD;  Location: [facility];  Service: Gynecology;  Laterality: N/A;  . LAPAROSCOPIC TUBAL LIGATION Bilateral 04/23/2014   Procedure: LAPAROSCOPIC TUBAL LIGATION with Filshie Clips;  Surgeon: Everlene Farrier, MD;  Location: [facility];  Service: Gynecology;  Laterality: Bilateral;    There were no vitals filed for this visit.  Subjective Assessment - 12/23/18 0903    Subjective  Pt reports she is no longer straining as much to completely empty urine, now only 1-2 x a day instead of every time she urinates. Pt will get a urodynamic study this week. Pt noticed no more popping of her L hip with stair climbing since the last 2 weeks.    Pertinent History  Perimenopausal, osteopenia, anxiety and depression are being  managed with medication, Gyn Hx: tubal ligation, episiotomy with first and only child, Physical activity: "very little". Pt prefers to exercise with other people. Pt is not feeling motivated. Walking her dog a couple of blocks is the extent of her activity she reports.    Patient Stated Goals  being able to stop taking Mybertriq         [facility] PT Assessment - 12/23/18 1042      Coordination   Gross Motor Movements are Fluid and Coordinated  --   more feet co-activation , but with adductor use on inhalatio               Pelvic Floor Special Questions - 12/23/18 0954    Pelvic Floor Internal Exam  pt consented verbally without contraindications     Palpation  tightness at L bulbospongiosus/ urethra compressae, R obt int, 7-5 o'clock 1-3rd layers ( decreased post Tx)     Strength  weak squeeze, no lift   bladder slightly lowered at pubic symphysis    Strength # of reps  --   post Tx, 3/5 , still withheld kegels        [facility] Adult PT Treatment/Exercise - 12/23/18 1041      Neuro Re-ed    Neuro Re-ed Details  cued for sequential activation of pelvic floor contraciton, less adductor overuse in inhalation      Modalities   Modalities  Moist Heat      Moist Heat Therapy   Number Minutes Moist Heat  5 Minutes    Moist Heat Location  --   perineum/ low ab      Manual Therapy   Internal Pelvic Floor  L bulbospongiosus/ urethra compressae, R obt int, 7-5 o'clock 1-3rd layers , MWM                   PT Long Term Goals - 11/24/18 0905      PT LONG TERM GOAL #1   Title  Pt will decrease her NIH-CPSI score from 35 % to <17  % in order to improve pelvic floor function    Time  10    Period  Weeks    Status  On-going      PT LONG TERM GOAL #2   Title  Pt will report dry pad by the end of the day across 5 days in order to be continent and maintain hygiene    Time  6    Period  Weeks    Status  On-going      PT LONG TERM GOAL #3   Title  Pt will demo increased  thoracic mobility in gait to improve mobility at diaphragm forb etter pelvic floor mobility.    Time  2    Period  Weeks    Status  Achieved      PT LONG TERM GOAL #4   Title  earlobe to acromium 21 cm B to increase to 22 cm B to minimize forward head posture and optimize deep core function    Time  10    Period  Weeks    Status  Partially Met      PT LONG TERM GOAL #5   Title  Pt will demo increased  R thoracic rotation from 40% to 60%  in order to minimze L posterior rotation of pelvis and promote better pelvic functions    Time  2    Period  Weeks    Status  Achieved      PT LONG TERM GOAL #6   Title  Pt will report complete emptying 100% of the time with urination to minimize risks for prolapse of the bladder    Time  8    Period  Weeks    Status  On-going      PT LONG TERM GOAL #7   Title  Pt will demo increased PF strength without UE support on wall > 10 reps B in order to minimize overuse of pelvic floor and optimize lower kinetic chain in balanace and stepping    Time  10    Period  Weeks    Status  On-going      PT LONG TERM GOAL #8   Title  Pt will demo proper coordination of pelvic floor low abdominal mm co-activation with simulated cough without straining/ pushing of abdomen outward and without cues required in order to minimize SUI    Time  5    Period  Weeks    Status  New    Target Date  12/29/18            Plan - 12/23/18 1057    Clinical Impression Statement Pt demo'd less pelvic floor tightness compared to last session with report of decreased straining with emptying bladder.  Internal pelvic assessment showed increased posterior mm tightness and scar restrictions which decreased post Tx. Pt demo'd overuse of adductor with inhalation which was corrected following cues. Pt also achieved increased pelvic floor contraction strength and sequential activation post Tx but kegel strengthening are still being withheld. Pt demo'd improved pelvic lengthening postTx  which will continue to help pt eliminate urine completely with less straining. Pt continues to benefit from skilled PT.    Examination-Activity Limitations  Toileting    Stability/Clinical Decision Making  Stable/Uncomplicated    Rehab Potential  Good    PT Frequency  1x / week    PT Duration  --   10   PT Treatment/Interventions  ADLs/Self Care Home Management;Electrical Stimulation;Cryotherapy;Ultrasound;Traction;Moist Heat;Iontophoresis 61m/ml Dexamethasone;Functional mobility training;Therapeutic activities;Therapeutic exercise;Patient/family education;Neuromuscular re-education;Manual techniques;Scar mobilization;Passive range of motion;Visual/perceptual remediation/compensation;Taping;Splinting;Energy conservation;Dry needling;Biofeedback;Aquatic Therapy;Manual lymph drainage;Balance training;Stair training    Consulted and Agree with Plan of Care  Patient       Patient will benefit from skilled therapeutic intervention in order to improve the following deficits and impairments:  Abnormal gait, Decreased activity tolerance, Decreased coordination, Decreased range of motion, Decreased strength, Hypomobility, Impaired flexibility, Impaired perceived functional ability, Impaired UE functional use, Postural dysfunction, Improper body mechanics, Pain, Decreased mobility, Decreased scar mobility, Increased muscle spasms  Visit Diagnosis: Other symptoms and signs involving the musculoskeletal system  Other lack of coordination  Abnormal posture     Problem List Patient Active Problem List   Diagnosis Date Noted  . Hyperlipidemia 05/17/2017  . Perimenopausal 02/13/2016  . 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 Numbness of right foot 11/29/2015  . Osteopenia 11/29/2015  . Vitamin D insufficiency 11/29/2015  . Deafness in left ear   . Anxiety 04/08/2013  . Benign essential tremor 04/08/2013  . Carpal tunnel syndrome 04/08/2013    YJerl Mina,PT, DPT,  E-RYT  12/23/2018, 11:28 AM  [facility]MAIN [facility] HospitalSERV[facility] [address] [address]Phone: [phone]  Fax:  [phone] Name: ABREANDA GREENLAWMRN: [id number] of Birth: [birth year]

## 2018-12-25 DIAGNOSIS — N3941 Urge incontinence: Secondary | ICD-10-CM | POA: Diagnosis not present

## 2018-12-26 ENCOUNTER — Other Ambulatory Visit: Payer: Self-pay | Admitting: Urology

## 2018-12-31 ENCOUNTER — Telehealth: Payer: 59 | Admitting: Urology

## 2018-12-31 ENCOUNTER — Ambulatory Visit: Payer: 59 | Admitting: Physical Therapy

## 2018-12-31 ENCOUNTER — Other Ambulatory Visit: Payer: Self-pay

## 2018-12-31 DIAGNOSIS — R293 Abnormal posture: Secondary | ICD-10-CM | POA: Diagnosis not present

## 2018-12-31 DIAGNOSIS — R29898 Other symptoms and signs involving the musculoskeletal system: Secondary | ICD-10-CM

## 2018-12-31 DIAGNOSIS — R279 Unspecified lack of coordination: Secondary | ICD-10-CM | POA: Diagnosis not present

## 2018-12-31 DIAGNOSIS — R278 Other lack of coordination: Secondary | ICD-10-CM | POA: Diagnosis not present

## 2018-12-31 DIAGNOSIS — M62838 Other muscle spasm: Secondary | ICD-10-CM

## 2018-12-31 NOTE — Therapy (Signed)
[place] MAIN [facility] SERVICES [place], [place], [address] Phone: [phone]   Fax:  [phone]  Physical Therapy Treatment  Patient Details  Name: Carolyn [name] MRN: [id number] Date of Birth: 1975-12-16 No data recorded  Encounter Date: 12/31/2018  PT End of Session - 12/31/18 1618    Visit Number  7    Number of Visits  10    Date for PT Re-Evaluation  01/12/19    PT Start Time  0802    PT Stop Time  0900    PT Time Calculation (min)  58 min    Activity Tolerance  Patient tolerated treatment well    Behavior During Therapy  [facility] for tasks assessed/performed       Past Medical History:  Diagnosis Date  . Anemia    a. low-normal H/H  . Anxiety   . Arthritis    osteopenia  . Benign essential tremor    a. bilateral upper and lower extremities; b. followed by [facility]; c. felt to be 2/2 antidepressants   . Carpal tunnel syndrome   . Deafness in left ear   . Depression   . Mixed incontinence   . Palpitations     Past Surgical History:  Procedure Laterality Date  . axillary lymph node removal Right 1982   enlarged lymph node  . IUD REMOVAL N/A 04/23/2014   Procedure: INTRAUTERINE DEVICE (IUD) REMOVAL;  Surgeon: Everlene Farrier, MD;  Location: [facility];  Service: Gynecology;  Laterality: N/A;  . LAPAROSCOPIC TUBAL LIGATION Bilateral 04/23/2014   Procedure: LAPAROSCOPIC TUBAL LIGATION with Filshie Clips;  Surgeon: Everlene Farrier, MD;  Location: [facility];  Service: Gynecology;  Laterality: Bilateral;    There were no vitals filed for this visit.  Subjective Assessment - 12/31/18 0803    Subjective  Pt had her urodynamic test and she had high PVR and had a hard time eliminating even with straining. Pt tried to go this morning, and the stream was start stop. Pt has not noticed the pad has been wet in her panty liner of 75% amount of the pad until the end of the day and is not sensing when the leakage is happening except when twisting  left in seated position. Pt used to twist left repeated at her desk due to her computer set up .  Pt has been delaying urination when she first senses urge due to business at work. Pt does go once every 90-120 mins which includes delaying her urge when bladder feels full. At that time, she feel urgency that she has to get there before there could be a change of leakage. "I better go now."    Pertinent History  Perimenopausal, osteopenia, anxiety and depression are being managed with medication, Gyn Hx: tubal ligation, episiotomy with first and only child, Physical activity: "very little". Pt prefers to exercise with other people. Pt is not feeling motivated. Walking her dog a couple of blocks is the extent of her activity she reports.    Patient Stated Goals  being able to stop taking Mybertriq                    Pelvic Floor Special Questions - 12/31/18 0855    Pelvic Floor Internal Exam  pt consented verbally without contraindications     Palpation  no tightness at posterior muscles.  limited upward mobility behind pubic symphysis, lowered position of urethra. bladder in more caudal position compared to last session  Strength  fair squeeze, definite lift    Strength # of reps  10        [facility] Adult PT Treatment/Exercise - 12/31/18 0856      Therapeutic Activites    Therapeutic Activities  --   explanation of Bradley loops, bladder training, anatomy     Modalities   Modalities  Moist Heat      Moist Heat Therapy   Number Minutes Moist Heat  5 Minutes    Moist Heat Location  --   perineum through sheets     Manual Therapy   Internal Pelvic Floor  fascial releases lateral border of urethra , external and internal technique to promote more upward mobility of anterior/ abdominal pelvic wall                   PT Long Term Goals - 12/31/18 1618      PT LONG TERM GOAL #1   Title  Pt will decrease her NIH-CPSI score from 35 % to <17  % in order to improve  pelvic floor function    Time  10    Period  Weeks    Status  On-going      PT LONG TERM GOAL #2   Title  Pt will report dry pad by the end of the day across 5 days in order to be continent and maintain hygiene    Time  6    Period  Weeks    Status  On-going      PT LONG TERM GOAL #3   Title  Pt will demo increased thoracic mobility in gait to improve mobility at diaphragm forb etter pelvic floor mobility.    Time  2    Period  Weeks    Status  Achieved      PT LONG TERM GOAL #4   Title  earlobe to acromium 21 cm B to increase to 22 cm B to minimize forward head posture and optimize deep core function    Time  10    Period  Weeks    Status  Partially Met      PT LONG TERM GOAL #5   Title  Pt will demo increased  R thoracic rotation from 40% to 60%  in order to minimze L posterior rotation of pelvis and promote better pelvic functions    Time  2    Period  Weeks    Status  Achieved      PT LONG TERM GOAL #6   Title  Pt will report complete emptying 100% of the time with urination to minimize risks for prolapse of the bladder    Time  8    Period  Weeks    Status  On-going      PT LONG TERM GOAL #7   Title  Pt will demo increased PF strength without UE support on wall > 10 reps B in order to minimize overuse of pelvic floor and optimize lower kinetic chain in balanace and stepping    Time  10    Period  Weeks    Status  On-going      PT LONG TERM GOAL #8   Title  Pt will demo proper coordination of pelvic floor low abdominal mm co-activation with simulated cough without straining/ pushing of abdomen outward and without cues required in order to minimize SUI    Time  5    Period  Weeks    Status  Achieved  Plan - 12/31/18 1618    Clinical Impression Statement  Pt demo'd a more cranial position of her bladder and no more tightness at posterior pelvic floor mm and perineal scars restrictions today which indicates effectiveness of last session's Tx. Focused  on facilitating a more cranial position of urethra with internal and external manual Tx which helped pt achieved a greater pelvic floor contraction strength. Withholding quick contraction of pelvic floor from HEP until pt reports no more delaying of urination, urgency.  Provided explanation of Bradley Loops and micturition functions. Encouraged pt that avoid delaying urination at work and to prioritize bladder health and sensation of fullness of bladder. Pt demonstrated improved pelvic floor lengthening as well which will help pt empty her bladder. Pt continues to benefit from skilled PT.  Plan to progress with multifidis/ thoracolumbar strengthening to promote more pelvic neutral position for better deep core activation.    Examination-Activity Limitations  Toileting    Stability/Clinical Decision Making  Stable/Uncomplicated    Rehab Potential  Good    PT Frequency  1x / week    PT Duration  --   10   PT Treatment/Interventions  ADLs/Self Care Home Management;Electrical Stimulation;Cryotherapy;Ultrasound;Traction;Moist Heat;Iontophoresis 59m/ml Dexamethasone;Functional mobility training;Therapeutic activities;Therapeutic exercise;Patient/family education;Neuromuscular re-education;Manual techniques;Scar mobilization;Passive range of motion;Visual/perceptual remediation/compensation;Taping;Splinting;Energy conservation;Dry needling;Biofeedback;Aquatic Therapy;Manual lymph drainage;Balance training;Stair training    Consulted and Agree with Plan of Care  Patient       Patient will benefit from skilled therapeutic intervention in order to improve the following deficits and impairments:  Abnormal gait, Decreased activity tolerance, Decreased coordination, Decreased range of motion, Decreased strength, Hypomobility, Impaired flexibility, Impaired perceived functional ability, Impaired UE functional use, Postural dysfunction, Improper body mechanics, Pain, Decreased mobility, Decreased scar mobility,  Increased muscle spasms  Visit Diagnosis: Other symptoms and signs involving the musculoskeletal system  Other lack of coordination  Abnormal posture  Other muscle spasm     Problem List Patient Active Problem List   Diagnosis Date Noted  . Hyperlipidemia 05/17/2017  . Perimenopausal 02/13/2016  . 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 Numbness of right foot 11/29/2015  . Osteopenia 11/29/2015  . Vitamin D insufficiency 11/29/2015  . Deafness in left ear   . Anxiety 04/08/2013  . Benign essential tremor 04/08/2013  . Carpal tunnel syndrome 04/08/2013    YJerl Mina,PT, DPT, E-RYT  12/31/2018, 4:19 PM  [facility]MAIN [facility] HospitalSERV[facility] [address] [place] [address]Phone: [phone]  Fax:  [phone] Name: AKANCHAN GALMRN: [id number] of Birth: [birth year]

## 2018-12-31 NOTE — Patient Instructions (Signed)
Continue with bladder retraining Follow urge to go and not delay   _____   Practice proper pelvic floor coordination  Inhale: expand pelvic floor muscles Exhale" "j" scoop, allow pelvic floor to close, lift first before belly sinks   ( not "draw abdominal muscle to spine" or strain with abdominal muscles")

## 2019-01-05 ENCOUNTER — Other Ambulatory Visit: Payer: Self-pay | Admitting: Physical Therapy

## 2019-01-05 ENCOUNTER — Other Ambulatory Visit: Payer: Self-pay | Admitting: Urology

## 2019-01-05 MED ORDER — FESOTERODINE FUMARATE ER 4 MG PO TB24: 4.0000 mg | ORAL_TABLET | Freq: Every day | ORAL | 0 refills | Status: DC

## 2019-01-05 NOTE — Progress Notes (Signed)
Discussed UDS report with Dr. Matilde Sprang and he recommends an antimuscarinic.  I have given her Toviaz 4 mg daily, # 28 samples.  She will follow up in three weeks for PVR and OAB questionnaire.

## 2019-01-07 ENCOUNTER — Ambulatory Visit: Payer: 59 | Admitting: Physical Therapy

## 2019-01-12 ENCOUNTER — Ambulatory Visit: Payer: 59 | Attending: Urology | Admitting: Physical Therapy

## 2019-01-12 ENCOUNTER — Other Ambulatory Visit: Payer: Self-pay

## 2019-01-12 DIAGNOSIS — R293 Abnormal posture: Secondary | ICD-10-CM | POA: Insufficient documentation

## 2019-01-12 DIAGNOSIS — R29898 Other symptoms and signs involving the musculoskeletal system: Secondary | ICD-10-CM | POA: Diagnosis not present

## 2019-01-12 DIAGNOSIS — R278 Other lack of coordination: Secondary | ICD-10-CM | POA: Insufficient documentation

## 2019-01-12 DIAGNOSIS — M62838 Other muscle spasm: Secondary | ICD-10-CM | POA: Insufficient documentation

## 2019-01-12 NOTE — Patient Instructions (Addendum)
    ____   PELVIC FLOOR / KEGEL EXERCISES   Pelvic floor/ Kegel exercises are used to strengthen the muscles in the base of your pelvis that are responsible for supporting your pelvic organs and preventing urine/feces leakage. Based on your therapist's recommendations, they can be performed while standing, sitting, or lying down. Imagine pelvic floor area as a diamond with pelvic landmarks: top =pubic bone, bottom tip=tailbone, sides=sitting bones (ischial tuberosities).    Make yourself aware of this muscle group by using these cues while coordinating your breath:  Inhale, feel pelvic floor diamond area lower like hammock towards your feet and ribcage/belly expanding. Pause. Let the exhale naturally and feel your belly sink, abdominal muscles hugging in around you and you may notice the pelvic diamond draws upward towards your head forming a umbrella shape. Give a squeeze during the exhalation like you are stopping the flow of urine. If you are squeezing the buttock muscles, try to give 50% less effort.   Common Errors:  Breath holding: If you are holding your breath, you may be bearing down against your bladder instead of pulling it up. If you belly bulges up while you are squeezing, you are holding your breath. Be sure to breathe gently in and out while exercising. Counting out loud may help you avoid holding your breath.  Accessory muscle use: You should not see or feel other muscle movement when performing pelvic floor exercises. When done properly, no one can tell that you are performing the exercises. Keep the buttocks, belly and inner thighs relaxed.  Overdoing it: Your muscles can fatigue and stop working for you if you over-exercise. You may actually leak more or feel soreness at the lower abdomen or rectum.  YOUR HOME EXERCISE PROGRAM    SHORT HOLDS: Position: on back,   Inhale and then exhale. Then squeeze the muscle.  (Be sure to let belly sink in with exhales and not push  outward)  Perform 10 repetitions with deep core level 1 , 2  Times/day

## 2019-01-12 NOTE — Therapy (Signed)
[facility] SERVICES [address] [place], [place], [address] Phone: [phone]   Fax:  [phone]  Physical Therapy Treatment  Patient Details  Name: Carolyn [name] MRN: [id number] Date of Birth: 03/07/1975 No data recorded  Encounter Date: 01/12/2019  PT End of Session - 01/12/19 0818    Visit Number  8    Number of Visits  10    Date for PT Re-Evaluation  01/12/19    PT Start Time  0800    PT Stop Time  0858    PT Time Calculation (min)  58 min    Activity Tolerance  Patient tolerated treatment well    Behavior During Therapy  WFL for tasks assessed/performed       Past Medical History:  Diagnosis Date  . Anemia    a. low-normal H/H  . Anxiety   . Arthritis    osteopenia  . Benign essential tremor    a. bilateral upper and lower extremities; b. followed by [facility]; c. felt to be 2/2 antidepressants   . Carpal tunnel syndrome   . Deafness in left ear   . Depression   . Mixed incontinence   . Palpitations     Past Surgical History:  Procedure Laterality Date  . axillary lymph node removal Right 1982   enlarged lymph node  . IUD REMOVAL N/A 04/23/2014   Procedure: INTRAUTERINE DEVICE (IUD) REMOVAL;  Surgeon: James Tomblin, MD;  Location: [facility];  Service: Gynecology;  Laterality: N/A;  . LAPAROSCOPIC TUBAL LIGATION Bilateral 04/23/2014   Procedure: LAPAROSCOPIC TUBAL LIGATION with Filshie Clips;  Surgeon: James Tomblin, MD;  Location: [facility];  Service: Gynecology;  Laterality: Bilateral;    There were no vitals filed for this visit.  Subjective Assessment - 01/12/19 0808    Subjective  Pt played pickle ball and noticed she leaked especially when R leg is crossed in front of L.  Pt started a new medication Toviaz for one week and noticed retention again. Pt is paying attention when working and not holding  the first urge to urinate as long as she was.  Stream is still slow to start and weak.    Pertinent History   Perimenopausal, osteopenia, anxiety and depression are being managed with medication, Gyn Hx: tubal ligation, episiotomy with first and only child, Physical activity: "very little". Pt prefers to exercise with other people. Pt is not feeling motivated. Walking her dog a couple of blocks is the extent of her activity she reports.    Patient Stated Goals  being able to stop taking Mybertriq                    Pelvic Floor Special Questions - 01/12/19 0854    Pelvic Floor Internal Exam  pt consented verbally without contraindications     Palpation  R ischiocavernosus, R side of urethra wall lowered, position of urethra, bladder more caudal position     ( Post Tx: increased upward motion of pelvic floor )    Strength  good squeeze, good lift, able to hold agaisnt strong resistance    Strength # of reps  10        [facility] Adult PT Treatment/Exercise - 01/12/19 0858      Therapeutic Activites    Therapeutic Activities  --   squatty potty/ raised foot on stool when toileting      Neuro Re-ed    Neuro Re-ed Details   pelvic tilt with   toileting       Modalities   Modalities  Moist Heat      Moist Heat Therapy   Number Minutes Moist Heat  5 Minutes    Moist Heat Location  --   perineum through sheets     Manual Therapy   Internal Pelvic Floor  fascial releases lateral border of urethra , external and internal technique to promote more upward mobility of anterior/ abdominal pelvic wall        SLS heel raises without UE support B 15 reps            PT Long Term Goals - 12/31/18 1618      PT LONG TERM GOAL #1   Title  Pt will decrease her NIH-CPSI score from 35 % to <17  % in order to improve pelvic floor function    Time  10    Period  Weeks    Status  On-going      PT LONG TERM GOAL #2   Title  Pt will report dry pad by the end of the day across 5 days in order to be continent and maintain hygiene    Time  6    Period  Weeks    Status  On-going      PT  LONG TERM GOAL #3   Title  Pt will demo increased thoracic mobility in gait to improve mobility at diaphragm forb etter pelvic floor mobility.    Time  2    Period  Weeks    Status  Achieved      PT LONG TERM GOAL #4   Title  earlobe to acromium 21 cm B to increase to 22 cm B to minimize forward head posture and optimize deep core function    Time  10    Period  Weeks    Status  Partially Met      PT LONG TERM GOAL #5   Title  Pt will demo increased  R thoracic rotation from 40% to 60%  in order to minimze L posterior rotation of pelvis and promote better pelvic functions    Time  2    Period  Weeks    Status  Achieved      PT LONG TERM GOAL #6   Title  Pt will report complete emptying 100% of the time with urination to minimize risks for prolapse of the bladder    Time  8    Period  Weeks    Status  On-going      PT LONG TERM GOAL #7   Title  Pt will demo increased PF strength without UE support on wall > 10 reps B in order to minimize overuse of pelvic floor and optimize lower kinetic chain in balanace and stepping    Time  10    Period  Weeks    Status  On-going      PT LONG TERM GOAL #8   Title  Pt will demo proper coordination of pelvic floor low abdominal mm co-activation with simulated cough without straining/ pushing of abdomen outward and without cues required in order to minimize SUI    Time  5    Period  Weeks    Status  Achieved            Plan - 01/12/19 1040    Clinical Impression Statement  Pt achieved to greater pelvic floor strength today with more complete upward lift of deeper anterior mm after today's  manual Tx to minimize tightness of R anterior mm and facilitate a more upward position of uretha. Suspect this R sided tightness may be related to leakage complaints with R hip adduction over L when playing pickle ball.   Added pelvic floor quick contraction today as pt demo'd significantly less mm tightness.  Anticipate today's achievements will  continue to help pt demonstrate a more cranial position of her urethra.   Pt also demo'd proper pelvic tilt , eliciting stronger pelvic floor contraction in posterior tilt.  Pt reports she will communicate with her MD re: her new medication which is again causing retention. Pt continues to practice no delaying urination/ overriding her urge of bladder fullness this past week.   Plan to simulate pickleball movements to better understand leakage issues.   Pt continues to benefit from skilled PT.    Examination-Activity Limitations  Toileting    Stability/Clinical Decision Making  Stable/Uncomplicated    Rehab Potential  Good    PT Frequency  1x / week    PT Duration  --   10   PT Treatment/Interventions  ADLs/Self Care Home Management;Electrical Stimulation;Cryotherapy;Ultrasound;Traction;Moist Heat;Iontophoresis 4mg/ml Dexamethasone;Functional mobility training;Therapeutic activities;Therapeutic exercise;Patient/family education;Neuromuscular re-education;Manual techniques;Scar mobilization;Passive range of motion;Visual/perceptual remediation/compensation;Taping;Splinting;Energy conservation;Dry needling;Biofeedback;Aquatic Therapy;Manual lymph drainage;Balance training;Stair training    Consulted and Agree with Plan of Care  Patient       Patient will benefit from skilled therapeutic intervention in order to improve the following deficits and impairments:  Abnormal gait, Decreased activity tolerance, Decreased coordination, Decreased range of motion, Decreased strength, Hypomobility, Impaired flexibility, Impaired perceived functional ability, Impaired UE functional use, Postural dysfunction, Improper body mechanics, Pain, Decreased mobility, Decreased scar mobility, Increased muscle spasms  Visit Diagnosis: Other symptoms and signs involving the musculoskeletal system  Other lack of coordination  Abnormal posture  Other muscle spasm     Problem List Patient Active Problem List    Diagnosis Date Noted  . Hyperlipidemia 05/17/2017  . Perimenopausal 02/13/2016  . 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 Numbness of right foot 11/29/2015  . Osteopenia 11/29/2015  . Vitamin D insufficiency 11/29/2015  . Deafness in left ear   . Anxiety 04/08/2013  . Benign essential tremor 04/08/2013  . Carpal tunnel syndrome 04/08/2013    ,Shin Yiing ,PT, DPT, E-RYT  01/12/2019, 10:59 AM  [facility] [address] [place], [place], [address] Phone: [phone]   Fax:  [phone]  Name: Carolyn [name] MRN: [id number] Date of Birth: 05/05/1975   

## 2019-01-14 DIAGNOSIS — F322 Major depressive disorder, single episode, severe without psychotic features: Secondary | ICD-10-CM | POA: Diagnosis not present

## 2019-01-16 DIAGNOSIS — F332 Major depressive disorder, recurrent severe without psychotic features: Secondary | ICD-10-CM | POA: Diagnosis not present

## 2019-01-16 DIAGNOSIS — F900 Attention-deficit hyperactivity disorder, predominantly inattentive type: Secondary | ICD-10-CM | POA: Diagnosis not present

## 2019-01-16 DIAGNOSIS — F410 Panic disorder [episodic paroxysmal anxiety] without agoraphobia: Secondary | ICD-10-CM | POA: Diagnosis not present

## 2019-01-20 ENCOUNTER — Other Ambulatory Visit: Payer: Self-pay

## 2019-01-20 ENCOUNTER — Ambulatory Visit: Payer: 59 | Admitting: Physical Therapy

## 2019-01-20 DIAGNOSIS — R293 Abnormal posture: Secondary | ICD-10-CM | POA: Diagnosis not present

## 2019-01-20 DIAGNOSIS — R29898 Other symptoms and signs involving the musculoskeletal system: Secondary | ICD-10-CM | POA: Diagnosis not present

## 2019-01-20 DIAGNOSIS — R278 Other lack of coordination: Secondary | ICD-10-CM | POA: Diagnosis not present

## 2019-01-20 DIAGNOSIS — M62838 Other muscle spasm: Secondary | ICD-10-CM

## 2019-01-20 NOTE — Therapy (Signed)
[place] [facility] SERVICES [address] [place], [place], [address] Phone: [phone]   Fax:  [phone]  Physical Therapy Treatment / Progress Note  Reporting 10/28/18 - 01/20/19  Across 9 visit   Patient Details  Name: Carolyn [name] MRN: [id number] Date of Birth: 1975/09/06 No data recorded  Encounter Date: 01/20/2019  PT End of Session - 01/20/19 1512    Visit Number  9    Number of Visits  10    Date for PT Re-Evaluation  03/31/19    PT Start Time  0805    PT Stop Time  0900    PT Time Calculation (min)  55 min    Activity Tolerance  Patient tolerated treatment well    Behavior During Therapy  [facility] for tasks assessed/performed       Past Medical History:  Diagnosis Date  . Anemia    a. low-normal H/H  . Anxiety   . Arthritis    osteopenia  . Benign essential tremor    a. bilateral upper and lower extremities; b. followed by [facility]; c. felt to be 2/2 antidepressants   . Carpal tunnel syndrome   . Deafness in left ear   . Depression   . Mixed incontinence   . Palpitations     Past Surgical History:  Procedure Laterality Date  . axillary lymph node removal Right 1982   enlarged lymph node  . IUD REMOVAL N/A 04/23/2014   Procedure: INTRAUTERINE DEVICE (IUD) REMOVAL;  Surgeon: Everlene Farrier, MD;  Location: [facility];  Service: Gynecology;  Laterality: N/A;  . LAPAROSCOPIC TUBAL LIGATION Bilateral 04/23/2014   Procedure: LAPAROSCOPIC TUBAL LIGATION with Filshie Clips;  Surgeon: Everlene Farrier, MD;  Location: [facility];  Service: Gynecology;  Laterality: Bilateral;    There were no vitals filed for this visit.  Subjective Assessment - 01/20/19 0816    Subjective  Pt report emptying urine has improved as she is able to completely empty 75% of the time instead of 50% of the time, Pt plans to meet with her urologist to talk about Topiaz medication.  Pt had stopped it for 3 days on her own and during this time, pt noticed she went to the  bathroom 4 x within 3 hours. Pt started back on it. Pt is trying to go to the bathroom when she feels the urge and not delay.    Pertinent History  Perimenopausal, osteopenia, anxiety and depression are being managed with medication, Gyn Hx: tubal ligation, episiotomy with first and only child, Physical activity: "very little". Pt prefers to exercise with other people. Pt is not feeling motivated. Walking her dog a couple of blocks is the extent of her activity she reports.    Patient Stated Goals  being able to stop taking Mybertriq         [facility] PT Assessment - 01/20/19 0959      Palpation   Palpation comment  low abdominal mm fascial tightness                Pelvic Floor Special Questions - 01/20/19 0911    Pelvic Floor Internal Exam  pt consented verbally without contraindications     Palpation  no tightness.  urethra slighlt lowered,     Strength  good squeeze, good lift, able to hold agaisnt strong resistance   with cues for anterior tilt of pelvis    Strength # of reps  3    Strength # of seconds  3        [facility] Adult PT Treatment/Exercise - 01/20/19 0955      Neuro Re-ed    Neuro Re-ed Details   cued for endurance pelvic floor exercise , self massage technique       Modalities   Modalities  --      Moist Heat Therapy   Number Minutes Moist Heat  --    Moist Heat Location  --      Manual Therapy   Manual therapy comments  fascial release over abdomen     Internal Pelvic Floor  facilitation lateral to urethra for more upward mobility                   PT Long Term Goals - 01/20/19 1513      PT LONG TERM GOAL #1   Title  Pt will decrease her NIH-CPSI score from 35 % to <17  % in order to improve pelvic floor function    Time  10    Period  Weeks    Status  On-going      PT LONG TERM GOAL #2   Title  Pt will report dry pad by the end of the day across 5 days in order to be continent and maintain hygiene    Time  6    Period  Weeks     Status  Partially Met      PT LONG TERM GOAL #3   Title  Pt will demo increased thoracic mobility in gait to improve mobility at diaphragm forb etter pelvic floor mobility.    Time  2    Period  Weeks    Status  Achieved      PT LONG TERM GOAL #4   Title  earlobe to acromium 21 cm B to increase to 22 cm B to minimize forward head posture and optimize deep core function    Time  10    Period  Weeks    Status  Achieved      PT LONG TERM GOAL #5   Title  Pt will demo increased  R thoracic rotation from 40% to 60%  in order to minimze L posterior rotation of pelvis and promote better pelvic functions    Time  2    Period  Weeks    Status  Achieved      PT LONG TERM GOAL #6   Title  Pt will report complete emptying 100% of the time with urination to minimize risks for prolapse of the bladder  ( 12: 75% of the time)    Time  8    Period  Weeks    Status  Partially Met      PT LONG TERM GOAL #7   Title  Pt will demo increased PF strength without UE support on wall > 10 reps B in order to minimize overuse of pelvic floor and optimize lower kinetic chain in balanace and stepping    Time  10    Period  Weeks    Status  Partially Met      PT LONG TERM GOAL #8   Title  Pt will demo proper coordination of pelvic floor low abdominal mm co-activation with simulated cough without straining/ pushing of abdomen outward and without cues required in order to minimize SUI    Time  5    Period  Weeks    Status  Achieved      PT LONG TERM GOAL  #  9   TITLE  Pt will demo more cranial position of urethra and bladder with pelvic floor strength 4/5 and progress from 3 sec, 4 reps contractions to  5 sec, 5 reps in order to minimize lowered pelvic organs and better elimination of urine    Time  10    Period  Weeks    Status  New    Target Date  03/31/19            Plan - 01/20/19 1517    Clinical Impression Statement  Pt has achieved 4/9 goals and is progressing well towards remaining goals.  Pt is able to completely urination about 75% of the time with no more straining.  Pt is paying attention urge sensation and not delaying urination.  Pt has demonstrated more cranial position of urethra and bladder and proper lengthening and contractions of pelvic floor with improved pelvci floor mobility, less tightness of perineal scar restrictions, and better deep core coordination, and more fascial mobility over suprapubic / abdominal areas.  Pt progressed to endurance pelvic floor training today. These improvements  will help preserve the tensigrity of pelvic organs in proper position and further help pt's urinary Sx.   Pt plans to communicate with her urologist re: Topiaz medication. Pt continues to benefit from skilled PT.     Examination-Activity Limitations  Toileting    Stability/Clinical Decision Making  Stable/Uncomplicated    Rehab Potential  Good    PT Frequency  1x / week    PT Duration  --   10   PT Treatment/Interventions  ADLs/Self Care Home Management;Electrical Stimulation;Cryotherapy;Ultrasound;Traction;Moist Heat;Iontophoresis 25m/ml Dexamethasone;Functional mobility training;Therapeutic activities;Therapeutic exercise;Patient/family education;Neuromuscular re-education;Manual techniques;Scar mobilization;Passive range of motion;Visual/perceptual remediation/compensation;Taping;Splinting;Energy conservation;Dry needling;Biofeedback;Aquatic Therapy;Manual lymph drainage;Balance training;Stair training    Consulted and Agree with Plan of Care  Patient       Patient will benefit from skilled therapeutic intervention in order to improve the following deficits and impairments:  Abnormal gait, Decreased activity tolerance, Decreased coordination, Decreased range of motion, Decreased strength, Hypomobility, Impaired flexibility, Impaired perceived functional ability, Impaired UE functional use, Postural dysfunction, Improper body mechanics, Pain, Decreased mobility, Decreased scar  mobility, Increased muscle spasms  Visit Diagnosis: Other symptoms and signs involving the musculoskeletal system  Other lack of coordination  Abnormal posture  Other muscle spasm     Problem List Patient Active Problem List   Diagnosis Date Noted  . Hyperlipidemia 05/17/2017  . Perimenopausal 02/13/2016  . 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 Numbness of right foot 11/29/2015  . Osteopenia 11/29/2015  . Vitamin D insufficiency 11/29/2015  . Deafness in left ear   . Anxiety 04/08/2013  . Benign essential tremor 04/08/2013  . Carpal tunnel syndrome 04/08/2013    YJerl Mina,PT, DPT, E-RYT  01/20/2019, 3:20 PM  [facility] [address] [address]Phone: [phone]  Fax:  [phone] Name: AKYLEAH PENSABENEMRN: [id number] of Birth: [birth year]

## 2019-01-20 NOTE — Patient Instructions (Signed)
Belly massage ( see handout)   PELVIC FLOOR / KEGEL EXERCISES   Pelvic floor/ Kegel exercises are used to strengthen the muscles in the base of your pelvis that are responsible for supporting your pelvic organs and preventing urine/feces leakage. Based on your therapist's recommendations, they can be performed while standing, sitting, or lying down. Imagine pelvic floor area as a diamond with pelvic landmarks: top =pubic bone, bottom tip=tailbone, sides=sitting bones (ischial tuberosities).    Make yourself aware of this muscle group by using these cues while coordinating your breath:  Inhale, feel pelvic floor diamond area lower like hammock towards your feet and ribcage/belly expanding. Pause. Let the exhale naturally and feel your belly sink, abdominal muscles hugging in around you and you may notice the pelvic diamond draws upward towards your head forming a umbrella shape. Give a squeeze during the exhalation like you are stopping the flow of urine. If you are squeezing the buttock muscles, try to give 50% less effort.   Common Errors:  Breath holding: If you are holding your breath, you may be bearing down against your bladder instead of pulling it up. If you belly bulges up while you are squeezing, you are holding your breath. Be sure to breathe gently in and out while exercising. Counting out loud may help you avoid holding your breath.  Accessory muscle use: You should not see or feel other muscle movement when performing pelvic floor exercises. When done properly, no one can tell that you are performing the exercises. Keep the buttocks, belly and inner thighs relaxed.  Overdoing it: Your muscles can fatigue and stop working for you if you over-exercise. You may actually leak more or feel soreness at the lower abdomen or rectum.  YOUR HOME EXERCISE PROGRAM  LONG HOLDS:   Position: on back or reclined in car seat ( do not lift head to sit up, instead make sure to use the handle to  raise the car seat up and keep spine/head relaxed to not place load on pelvic floor/ abdominal muscle)     Inhale and then exhale. Then squeeze the muscle and count aloud for 3 seconds. Rest with three long breaths. (Be sure to let belly sink in with exhales and not push outward)  Perform 3 repetitions, 4  times/day  SHORT HOLDS: Position: on back,   Inhale and then exhale. Then squeeze the muscle.  (Be sure to let belly sink in with exhales and not push outward)  Perform 10 repetitions, 2  Times/day                      DECREASE DOWNWARD PRESSURE ON  YOUR PELVIC FLOOR, ABDOMINAL, LOW BACK MUSCLES       PRESERVE YOUR PELVIC HEALTH LONG-TERM   ** SQUEEZE pelvic floor BEFORE YOUR SNEEZE, COUGH, LAUGH   ** EXHALE BEFORE YOU RISE AGAINST GRAVITY (lifting, sit to stand, from squat to stand)   ** LOG ROLL OUT OF BED INSTEAD OF CRUNCH/SIT-UP

## 2019-01-23 ENCOUNTER — Other Ambulatory Visit: Payer: Self-pay

## 2019-01-23 ENCOUNTER — Ambulatory Visit (INDEPENDENT_AMBULATORY_CARE_PROVIDER_SITE_OTHER): Payer: 59 | Admitting: Urology

## 2019-01-23 ENCOUNTER — Encounter: Payer: Self-pay | Admitting: Urology

## 2019-01-23 VITALS — BP 132/80 | HR 116 | Wt 198.0 lb

## 2019-01-23 DIAGNOSIS — N39498 Other specified urinary incontinence: Secondary | ICD-10-CM

## 2019-01-23 DIAGNOSIS — R350 Frequency of micturition: Secondary | ICD-10-CM

## 2019-01-23 LAB — BLADDER SCAN AMB NON-IMAGING: Scan Result: 5

## 2019-01-23 MED ORDER — TROSPIUM CHLORIDE ER 60 MG PO CP24: 1.0000 | ORAL_CAPSULE | Freq: Every day | ORAL | 3 refills | Status: DC

## 2019-01-23 NOTE — Progress Notes (Signed)
01/23/2019 5:19 PM   Carolyn [name] December 17, 1975 [id number]  Referring provider: Olin Hauser, DO [address] [place],  [place] [address]  Chief Complaint  Patient presents with  . Urinary Frequency    HPI: Patient is a 44 year old female who presents today for follow up for OAB and cystocele after undergoing UDS and a trial of Toviaz 4 mg daily.    She underwent UDS on 12/25/2018 at [facility], [place].  On urodynamics, she did not void for 2-2.5 hours and was catheterized for 320 mL.  Maximum bladder capacity was 790 mL.    Her first sensation occurred at 83 mL, but it was felt that this was due to the urethral catheter versus a true sensation.  Normal desire occurred at 193 mL.  Strong desire occurred at 641 mL.  The bladder was unstable noting low amplitude contractions.   Her first unstable contraction occurred at 619 mL and maximum unstable detrussor contraction was 6 cm H2O.  She was aware of the contractions coming and going, but she was able to inhibit the leaking.    Leak point pressures were assessed in a seating position.  No leakage was noted with abdominal pressures ranging from 127 to 156 cm H2O.   She was able to generate some voluntary contractions ranging from 10 to 15 cm H2O, but they were not well sustained and she could not get her flow started.  She ended up straining to initiate her void which she states is her typical pattern.  Her voided volume was 593 mL with a max flow of 21 mL/s.  Detrussor pressure at peak flow was 30 cm H2O.  Max detrussor pressure was 40 cm H2O.  PVR was 197 mL and she did not feel empty at that volume but was unable to void anymore.  Mild increased EMG activity was noted during voiding.  During coughing and Valsalva the bladder distended approximately 2 cm and some bulging of the bladder neck was noted during voiding with some mild trabeculation.  No reflux was seen.    She was given a trial of Toviaz 4 mg daily.  She still  experiences frequency, occasional nocturia, incontinence, intermittency, hesitancy, straining to urinate and a weak urinary stream.  Her PVR in the office today was 5 mL.  She initially responded well to Myrbetriq 25 mg daily and PT, but started to experience worsening in her urinary hesitancy and straining to urinate.  Her PVR had also increased to 153 mL from negligible amounts.  The Myrbetriq was discontinued and she was referred back to physical therapy.  She finds the physical therapy is helpful, but she still noticing her incontinence pads becoming wet without her awareness.    Today, she is experiencing frequency, occasionally nocturia. Incontinence, intermittency, hesitancy, straining to urinate and a weak urinary stream.  Her PVR is 5 mL.    This urinary leakage was not improved on the Toviaz 4 mg daily.  The Toviaz also caused dry mouth.    The incontinence is her most bothersome symptom.  Patient denies any modifying or aggravating factors.  Patient denies any gross hematuria, dysuria or suprapubic/flank pain.  Patient denies any fevers, chills, nausea or vomiting.   PMH: Past Medical History:  Diagnosis Date  . Anemia    a. low-normal H/H  . Anxiety   . Arthritis    osteopenia  . Benign essential tremor    a. bilateral upper and lower extremities; b. followed by  [facility]; c. felt to be 2/2 antidepressants   . Carpal tunnel syndrome   . Deafness in left ear   . Depression   . Mixed incontinence   . Palpitations     Surgical History: Past Surgical History:  Procedure Laterality Date  . axillary lymph node removal Right 1982   enlarged lymph node  . IUD REMOVAL N/A 04/23/2014   Procedure: INTRAUTERINE DEVICE (IUD) REMOVAL;  Surgeon: Harold Hedge, MD;  Location: [facility];  Service: Gynecology;  Laterality: N/A;  . LAPAROSCOPIC TUBAL LIGATION Bilateral 04/23/2014   Procedure: LAPAROSCOPIC TUBAL LIGATION with Filshie Clips;  Surgeon: Harold Hedge, MD;  Location: [facility];   Service: Gynecology;  Laterality: Bilateral;    Home Medications:  Allergies as of 01/23/2019      Reactions   Latex Itching, Rash   sensitivity      Medication List       Accurate as of January 23, 2019 11:59 PM. If you have any questions, ask your nurse or doctor.        clonazePAM 0.5 MG tablet Commonly known as: KLONOPIN Take 0.5 mg by mouth 2 (two) times daily as needed for anxiety.   fesoterodine 4 MG Tb24 tablet Commonly known as: Toviaz Take 1 tablet (4 mg total) by mouth daily.   lamoTRIgine 200 MG tablet Commonly known as: LAMICTAL   MULTIVITAMIN PO Take 1 tablet by mouth daily.   primidone 50 MG tablet Commonly known as: MYSOLINE Take 100 mg by mouth 2 (two) times daily.   Rexulti 3 MG Tabs Generic drug: Brexpiprazole Take 1 tablet by mouth daily.   sertraline 100 MG tablet Commonly known as: ZOLOFT Take 200 mg by mouth daily.   traZODone 100 MG tablet Commonly known as: DESYREL   Trospium Chloride 60 MG Cp24 Take 1 capsule (60 mg total) by mouth daily. Started by: Michiel Cowboy, PA-C   venlafaxine XR 150 MG 24 hr capsule Commonly known as: EFFEXOR-XR   Vitamin D3 50 MCG (2000 UT) Tabs Take 2,000 Units by mouth daily.   vitamin E 1000 UNIT capsule       Allergies:  Allergies  Allergen Reactions  . Latex Itching and Rash    sensitivity    Family History: Family History  Problem Relation Age of Onset  . Depression Mother        bipolar disorder  . Arthritis Mother        osteoprosis  . Heart disease Father   . Heart attack Father 52  . Heart disease Paternal Grandmother   . Cancer Paternal Grandmother        breast cancer  . Colon cancer Neg Hx   . Bladder Cancer Neg Hx   . Kidney cancer Neg Hx     Social History:  reports that she has never smoked. She has never used smokeless tobacco. She reports current alcohol use of about 3.0 standard drinks of alcohol per week. She reports that she does not use  drugs.  ROS: UROLOGY Frequent Urination?: Yes Hard to postpone urination?: No Burning/pain with urination?: No Get up at night to urinate?: Yes Leakage of urine?: Yes Urine stream starts and stops?: Yes Trouble starting stream?: Yes Do you have to strain to urinate?: Yes Blood in urine?: No Urinary tract infection?: No Sexually transmitted disease?: No Injury to kidneys or bladder?: No Painful intercourse?: No Weak stream?: Yes Currently pregnant?: No Vaginal bleeding?: No Last menstrual period?: n  Gastrointestinal Nausea?: No Vomiting?: No Indigestion/heartburn?: No Diarrhea?:  No Constipation?: No  Constitutional Fever: No Night sweats?: Yes Weight loss?: No Fatigue?: No  Skin Skin rash/lesions?: No Itching?: No  Eyes Blurred vision?: No Double vision?: No  Ears/Nose/Throat Sore throat?: No Sinus problems?: No  Hematologic/Lymphatic Swollen glands?: No Easy bruising?: No  Cardiovascular Leg swelling?: No Chest pain?: No  Respiratory Cough?: No Shortness of breath?: No  Endocrine Excessive thirst?: Yes  Musculoskeletal Back pain?: No Joint pain?: No  Neurological Headaches?: No Dizziness?: No  Psychologic Depression?: Yes Anxiety?: Yes  Physical Exam: BP 132/80   Pulse (!) 116   Wt 198 lb (89.8 kg)   BMI 29.24 kg/m   Constitutional:  Well nourished. Alert and oriented, No acute distress. HEENT: [place] AT, mask in place.  Trachea midline, no masses. Cardiovascular: No clubbing, cyanosis, or edema. Respiratory: Normal respiratory effort, no increased work of breathing. Neurologic: Grossly intact, no focal deficits, moving all 4 extremities. Psychiatric: Normal mood and affect.   Laboratory Data: Lab Results  Component Value Date   WBC 5.6 07/22/2018   HGB 12.5 07/22/2018   HCT 37.5 07/22/2018   MCV 98.4 07/22/2018   PLT 316 07/22/2018    Lab Results  Component Value Date   CREATININE 0.66 07/22/2018    Lab Results   Component Value Date   HGBA1C 5.1 07/22/2018    Lab Results  Component Value Date   TSH 0.64 07/22/2018       Component Value Date/Time   CHOL 211 (H) 10/31/2018 0757   HDL 69 10/31/2018 0757   CHOLHDL 3.1 10/31/2018 0757   VLDL 34 07/28/2015 0757   LDLCALC 117 (H) 10/31/2018 0757    Lab Results  Component Value Date   AST 27 07/22/2018   Lab Results  Component Value Date   ALT 17 07/22/2018   I have reviewed the labs.   Pertinent Imaging: Results for DEMYAH, SMYRE (MRN [id number]) as of 02/22/2019 17:20  Ref. Range 01/23/2019 08:17  Scan Result Unknown 5     Assessment & Plan:    1. Incontinence Medications has not afforded Mrs. Berland control of her urinary leakage.  While medications appeared to reduce her frequency, she still found herself wet at times. We will switch to another OAB agent, trospium XL 60 mg daily to see if it offers more control over her urinary symptoms and she will have an appointment with Dr. Sherron Monday.     Return for please schedule an appointment with Dr. Sherron Monday .  These notes generated with voice recognition software. I apologize for typographical errors.  Michiel Cowboy, PA-C  [facility] [address] [place], [place] [address] [phone]

## 2019-01-26 ENCOUNTER — Ambulatory Visit: Payer: 59 | Admitting: Physical Therapy

## 2019-01-26 ENCOUNTER — Other Ambulatory Visit: Payer: Self-pay

## 2019-01-26 DIAGNOSIS — M62838 Other muscle spasm: Secondary | ICD-10-CM | POA: Diagnosis not present

## 2019-01-26 DIAGNOSIS — F322 Major depressive disorder, single episode, severe without psychotic features: Secondary | ICD-10-CM | POA: Diagnosis not present

## 2019-01-26 DIAGNOSIS — R278 Other lack of coordination: Secondary | ICD-10-CM | POA: Diagnosis not present

## 2019-01-26 DIAGNOSIS — R293 Abnormal posture: Secondary | ICD-10-CM

## 2019-01-26 DIAGNOSIS — R29898 Other symptoms and signs involving the musculoskeletal system: Secondary | ICD-10-CM | POA: Diagnosis not present

## 2019-01-26 NOTE — Patient Instructions (Addendum)
Pelvic floor long holds 3 sec, 6 reps this week lying on back x 3 -5 times   Seated pelvic floor quick contractions, 6 reps, 3- 5 x times  But do not do these back to back     ____  Strengthening back/ glut/ leg muscles    Bridging series w/ resistive band other side of doorknob:  Level 1:  Position:  Elbows bent, knees hip width apart, heels under knees on top of stable  foot stool   Stabilization points: shoulders, upper arms, back of head pressed into floor. Heel press downward.   Movement: inhale do nothing, exhale pull band by side, lower fists to floor completely while lifting hips.Keep stabilization points engaged when you allow the band to go back to starting position  10 x 2 reps    Level 2:  Position:  Elbows straight, arms raised to ceiling at shoulder height, knees apart like a ballerina,heels together, heels under knees, on top of stable  foot stool   Stabilization points: shoulders, upper arms, back of head pressed into floor. Heel press downward.   Movement: inhale do nothing, exhale pull band by side, lower fists to floor completely while lifting hips. Keep stabilization points engaged when you allow the band to go back to starting position   10 x 2 reps  Shoulder training: Try to imagine you are squeezing a pencil under your armpit and your shoulder blades are down away from your ears and towards each other    ___ Complimentary stretches   Eagle arms in chair pose  ( mini squat)  3 reps each side    Dolphin plank up and down against the wall ( forearm slides)  10 reps    ___ Cardio:  Step up with faster speed in 2 min ( L up/ down ) ,   2 min ( R up/ down)   Then stretch    Or standing marches 6 min

## 2019-01-26 NOTE — Therapy (Addendum)
[place] MAIN [facility] SERVICES [place], [place], [address] Phone: [phone]   Fax:  [phone]  Physical Therapy Treatment  Patient Details  Name: Carolyn [name] MRN: [id number] Date of Birth: March 28, 1975 No data recorded  Encounter Date: 01/26/2019  PT End of Session - 01/26/19 0829    Visit Number  10   Number of Visits  19    Date for PT Re-Evaluation  03/31/19   11/03/18 eval, progress on 01/20/19 #9   PT Start Time  0805    PT Stop Time  0838    PT Time Calculation (min)  33 min    Activity Tolerance  Patient tolerated treatment well    Behavior During Therapy  [facility] for tasks assessed/performed       Past Medical History:  Diagnosis Date  . Anemia    a. low-normal H/H  . Anxiety   . Arthritis    osteopenia  . Benign essential tremor    a. bilateral upper and lower extremities; b. followed by [facility]; c. felt to be 2/2 antidepressants   . Carpal tunnel syndrome   . Deafness in left ear   . Depression   . Mixed incontinence   . Palpitations     Past Surgical History:  Procedure Laterality Date  . axillary lymph node removal Right 1982   enlarged lymph node  . IUD REMOVAL N/A 04/23/2014   Procedure: INTRAUTERINE DEVICE (IUD) REMOVAL;  Surgeon: Everlene Farrier, MD;  Location: [facility];  Service: Gynecology;  Laterality: N/A;  . LAPAROSCOPIC TUBAL LIGATION Bilateral 04/23/2014   Procedure: LAPAROSCOPIC TUBAL LIGATION with Filshie Clips;  Surgeon: Everlene Farrier, MD;  Location: [facility];  Service: Gynecology;  Laterality: Bilateral;    There were no vitals filed for this visit.  Subjective Assessment - 01/26/19 0809    Subjective  Pt reported her urologist will change her medications soon.    Pertinent History  Perimenopausal, osteopenia, anxiety and depression are being managed with medication, Gyn Hx: tubal ligation, episiotomy with first and only child, Physical activity: "very little". Pt prefers to exercise with other  people. Pt is not feeling motivated. Walking her dog a couple of blocks is the extent of her activity she reports.    Patient Stated Goals  being able to stop taking Mybertriq         [facility] PT Assessment - 01/26/19 0826      Observation/Other Assessments   Observations  minor cues for scap retraction / cervical retraction       Coordination   Gross Motor Movements are Fluid and Coordinated  --   cued for diaphragmatic excursion               Pelvic Floor Special Questions - 01/26/19 0825    External Palpation  4/5 strength, 3 sec, 6 reps    cued for lengthening pelvioc floor/ inhalation       [facility] Adult PT Treatment/Exercise - 01/26/19 9924      Neuro Re-ed    Neuro Re-ed Details   cued for diaphragmatic excursion/ pelvic floor lengthening .  Cued for thoracolumbar strengthening ( scap retraction/cervical retraction )       Exercises   Exercises  --   see new HEP for thoracolu,bar strengtehning                 PT Long Term Goals - 01/26/19 0844      PT LONG TERM GOAL #1  Title  Pt will decrease her NIH-CPSI score from 35 % to <17  % in order to improve pelvic floor function  ( 1/18: 25% ) 2    Time  10    Period  Weeks    Status  Partially Met      PT LONG TERM GOAL #2   Title  Pt will report dry pad by the end of the day across 5 days in order to be continent and maintain hygiene    Time  6    Period  Weeks    Status  Partially Met      PT LONG TERM GOAL #3   Title  Pt will demo increased thoracic mobility in gait to improve mobility at diaphragm forb etter pelvic floor mobility.    Time  2    Period  Weeks    Status  Achieved      PT LONG TERM GOAL #4   Title  earlobe to acromium 21 cm B to increase to 22 cm B to minimize forward head posture and optimize deep core function    Time  10    Period  Weeks    Status  Achieved      PT LONG TERM GOAL #5   Title  Pt will demo increased  R thoracic rotation from 40% to 60%  in order to  minimze L posterior rotation of pelvis and promote better pelvic functions    Time  2    Period  Weeks    Status  Achieved      PT LONG TERM GOAL #6   Title  Pt will report complete emptying 100% of the time with urination to minimize risks for prolapse of the bladder  ( 12: 75% of the time)    Time  8    Period  Weeks    Status  Partially Met      PT LONG TERM GOAL #7   Title  Pt will demo increased PF strength without UE support on wall > 10 reps B in order to minimize overuse of pelvic floor and optimize lower kinetic chain in balanace and stepping    Time  10    Period  Weeks    Status  Partially Met      PT LONG TERM GOAL #8   Title  Pt will demo proper coordination of pelvic floor low abdominal mm co-activation with simulated cough without straining/ pushing of abdomen outward and without cues required in order to minimize SUI    Time  5    Period  Weeks    Status  Achieved      PT LONG TERM GOAL  #9   TITLE  Pt will demo more cranial position of urethra and bladder with pelvic floor strength 4/5 and progress from 3 sec, 4 reps contractions to  5 sec, 5 reps in order to minimize lowered pelvic organs and better elimination of urine    Time  10    Period  Weeks    Status  New            Plan - 01/26/19 0902    Clinical Impression Statement  Pt progressed to thoracolumbar strengthening exercises with moderate cues. Progressed to seated pelvic floor quick contractions, and more reps with sustained holds in supine. MOderate cues for more pelvic floor lengthening. Pt continues to benefit from skilled PT    Examination-Activity Limitations  Toileting    Stability/Clinical Decision Making  Stable/Uncomplicated    Rehab Potential  Good    PT Frequency  1x / week    PT Duration  --   10   PT Treatment/Interventions  ADLs/Self Care Home Management;Electrical Stimulation;Cryotherapy;Ultrasound;Traction;Moist Heat;Iontophoresis 4mg/ml Dexamethasone;Functional mobility  training;Therapeutic activities;Therapeutic exercise;Patient/family education;Neuromuscular re-education;Manual techniques;Scar mobilization;Passive range of motion;Visual/perceptual remediation/compensation;Taping;Splinting;Energy conservation;Dry needling;Biofeedback;Aquatic Therapy;Manual lymph drainage;Balance training;Stair training    Consulted and Agree with Plan of Care  Patient       Patient will benefit from skilled therapeutic intervention in order to improve the following deficits and impairments:  Abnormal gait, Decreased activity tolerance, Decreased coordination, Decreased range of motion, Decreased strength, Hypomobility, Impaired flexibility, Impaired perceived functional ability, Impaired UE functional use, Postural dysfunction, Improper body mechanics, Pain, Decreased mobility, Decreased scar mobility, Increased muscle spasms  Visit Diagnosis: Other lack of coordination  Other symptoms and signs involving the musculoskeletal system  Abnormal posture  Other muscle spasm     Problem List Patient Active Problem List   Diagnosis Date Noted  . Hyperlipidemia 05/17/2017  . Perimenopausal 02/13/2016  . 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 Numbness of right foot 11/29/2015  . Osteopenia 11/29/2015  . Vitamin D insufficiency 11/29/2015  . Deafness in left ear   . Anxiety 04/08/2013  . Benign essential tremor 04/08/2013  . Carpal tunnel syndrome 04/08/2013    ,Shin Yiing ,PT, DPT, E-RYT  01/26/2019, 9:03 AM  [facility] [address] [place], [place], [address] Phone: [phone]   Fax:  [phone]  Name: Carolyn [name] MRN: [id number] Date of Birth: 10/29/1975   

## 2019-01-29 ENCOUNTER — Ambulatory Visit: Payer: 59 | Admitting: Urology

## 2019-02-03 ENCOUNTER — Ambulatory Visit: Payer: 59 | Admitting: Physical Therapy

## 2019-02-03 ENCOUNTER — Other Ambulatory Visit: Payer: Self-pay

## 2019-02-03 DIAGNOSIS — R293 Abnormal posture: Secondary | ICD-10-CM

## 2019-02-03 DIAGNOSIS — M62838 Other muscle spasm: Secondary | ICD-10-CM | POA: Diagnosis not present

## 2019-02-03 DIAGNOSIS — R29898 Other symptoms and signs involving the musculoskeletal system: Secondary | ICD-10-CM

## 2019-02-03 DIAGNOSIS — R278 Other lack of coordination: Secondary | ICD-10-CM | POA: Diagnosis not present

## 2019-02-03 NOTE — Patient Instructions (Addendum)
GI health to minimize straining pelvic floor   Start the morning before coffee with a room temp ( lemon juice squeeze)    Ice freezes up the GI function Room temp glass before meals would help with digestion and bowels    Complete food diary  dont skip meals Ensure breakfast ( variate your breakfast options)  Snacks with more fiber:  Celery/ carrot sticks with hummus, almond butter  Dried fruits ( prunes)  Nuts   __________  Progress pelvic floor strengthening 3-4 x a day  5 sec, 5 reps  With complete inhalation ( full expansion of posterior ribs) during rest breaths of 3    __________  3 x up/ down stairs for cardio with stretches on days you could not get a walk in

## 2019-02-03 NOTE — Therapy (Signed)
[place] MAIN [facility] SERVICES [place], [place], [address] Phone: [phone]   Fax:  [phone]  Physical Therapy Treatment  Patient Details  Name: Carolyn [name] MRN: [id number] Date of Birth: [birth year] No data recorded  Encounter Date: 02/03/2019  PT End of Session - 02/03/19 0816    Visit Number  11    Number of Visits  19    Date for PT Re-Evaluation  03/31/19   11/03/18 eval, progress on 01/20/19 #9   PT Start Time  0807    PT Stop Time  0900    PT Time Calculation (min)  53 min    Activity Tolerance  Patient tolerated treatment well    Behavior During Therapy  [facility] for tasks assessed/performed       Past Medical History:  Diagnosis Date  . Anemia    a. low-normal H/H  . Anxiety   . Arthritis    osteopenia  . Benign essential tremor    a. bilateral upper and lower extremities; b. followed by [facility]; c. felt to be 2/2 antidepressants   . Carpal tunnel syndrome   . Deafness in left ear   . Depression   . Mixed incontinence   . Palpitations     Past Surgical History:  Procedure Laterality Date  . axillary lymph node removal Right 1982   enlarged lymph node  . IUD REMOVAL N/A 04/23/2014   Procedure: INTRAUTERINE DEVICE (IUD) REMOVAL;  Surgeon: Everlene Farrier, MD;  Location: [facility];  Service: Gynecology;  Laterality: N/A;  . LAPAROSCOPIC TUBAL LIGATION Bilateral 04/23/2014   Procedure: LAPAROSCOPIC TUBAL LIGATION with Filshie Clips;  Surgeon: Everlene Farrier, MD;  Location: [facility];  Service: Gynecology;  Laterality: Bilateral;    There were no vitals filed for this visit.  Subjective Assessment - 02/03/19 0815    Subjective  Pt changed her bladder medication again and this new one is helping her with teh leakage and not going to the bathroom often. Pt reported emptying urine is better the past few days. Pt started using the Squatty Potty the past 1-2 weeks.  Tilting pelvis forward helps with elimination.  Bowel movements  occur 3 x times. Her last bowel movement was large in size and she bled. At home she is not drinking as much water.  Pt is not eating breakfast, just coffee. For lunch, a protein bar, For dinner, normal meal . Pt states she is not eating alot of fruit. Pt is trying to lose weight.    Pertinent History  Perimenopausal, osteopenia, anxiety and depression are being managed with medication, Gyn Hx: tubal ligation, episiotomy with first and only child, Physical activity: "very little". Pt prefers to exercise with other people. Pt is not feeling motivated. Walking her dog a couple of blocks is the extent of her activity she reports.    Patient Stated Goals  being able to stop taking Mybertriq                    Pelvic Floor Special Questions - 02/03/19 1315    External Palpation  No pelvic floor tightness noted B, which made pt appropriate for progression of endurance pelvic floor training Seated , 5 sec 5 reps with cued for proper lengthening         [facility] Adult PT Treatment/Exercise - 02/03/19 1255      Therapeutic Activites    Therapeutic Activities  Other Therapeutic Activities    Other Therapeutic Activities  discussed optimal bowel health, improving stool consistency and regularity, food diary to complete       Neuro Re-ed    Neuro Re-ed Details   cued for optimal pelvic floor lengthen with progresion of 5 reps, 5 sec in seated position                   PT Long Term Goals - 01/26/19 0844      PT LONG TERM GOAL #1   Title  Pt will decrease her NIH-CPSI score from 35 % to <17  % in order to improve pelvic floor function  ( 1/18: 25% ) 2    Time  10    Period  Weeks    Status  Partially Met      PT LONG TERM GOAL #2   Title  Pt will report dry pad by the end of the day across 5 days in order to be continent and maintain hygiene    Time  6    Period  Weeks    Status  Partially Met      PT LONG TERM GOAL #3   Title  Pt will demo increased thoracic mobility in  gait to improve mobility at diaphragm forb etter pelvic floor mobility.    Time  2    Period  Weeks    Status  Achieved      PT LONG TERM GOAL #4   Title  earlobe to acromium 21 cm B to increase to 22 cm B to minimize forward head posture and optimize deep core function    Time  10    Period  Weeks    Status  Achieved      PT LONG TERM GOAL #5   Title  Pt will demo increased  R thoracic rotation from 40% to 60%  in order to minimze L posterior rotation of pelvis and promote better pelvic functions    Time  2    Period  Weeks    Status  Achieved      PT LONG TERM GOAL #6   Title  Pt will report complete emptying 100% of the time with urination to minimize risks for prolapse of the bladder  ( 12: 75% of the time)    Time  8    Period  Weeks    Status  Partially Met      PT LONG TERM GOAL #7   Title  Pt will demo increased PF strength without UE support on wall > 10 reps B in order to minimize overuse of pelvic floor and optimize lower kinetic chain in balanace and stepping    Time  10    Period  Weeks    Status  Partially Met      PT LONG TERM GOAL #8   Title  Pt will demo proper coordination of pelvic floor low abdominal mm co-activation with simulated cough without straining/ pushing of abdomen outward and without cues required in order to minimize SUI    Time  5    Period  Weeks    Status  Achieved      PT LONG TERM GOAL  #9   TITLE  Pt will demo more cranial position of urethra and bladder with pelvic floor strength 4/5 and progress from 3 sec, 4 reps contractions to  5 sec, 5 reps in order to minimize lowered pelvic organs and better elimination of urine    Time  10    Period  Weeks    Status Achieved            Plan - 02/03/19 1255    Clinical Impression Statement   No pelvic floor tightness noted B, which made pt appropriate for progression of endurance pelvic floor training in seated position. Pt required cues for optimal lengthening to ensure coordination and  minimize relapse of overactivity of pelvic floor.  Discussed bowel health to help manitain longterm outcomes for minimizing straining pelvic floor today as pt reported constipation, limited water intake on the weekends, skipping meals for the goal of losing weight. Discussed to complete a food diary, ensure breakfast, snacks with fiber for better stool consistency. Pt voiced agreement. Pt continues to benefit fromskilled PT. Plan to advance with fitness exercises at next session.    Examination-Activity Limitations  Toileting    Stability/Clinical Decision Making  Stable/Uncomplicated    Rehab Potential  Good    PT Frequency  1x / week    PT Duration  --   10   PT Treatment/Interventions  ADLs/Self Care Home Management;Electrical Stimulation;Cryotherapy;Ultrasound;Traction;Moist Heat;Iontophoresis 84m/ml Dexamethasone;Functional mobility training;Therapeutic activities;Therapeutic exercise;Patient/family education;Neuromuscular re-education;Manual techniques;Scar mobilization;Passive range of motion;Visual/perceptual remediation/compensation;Taping;Splinting;Energy conservation;Dry needling;Biofeedback;Aquatic Therapy;Manual lymph drainage;Balance training;Stair training    Consulted and Agree with Plan of Care  Patient       Patient will benefit from skilled therapeutic intervention in order to improve the following deficits and impairments:  Abnormal gait, Decreased activity tolerance, Decreased coordination, Decreased range of motion, Decreased strength, Hypomobility, Impaired flexibility, Impaired perceived functional ability, Impaired UE functional use, Postural dysfunction, Improper body mechanics, Pain, Decreased mobility, Decreased scar mobility, Increased muscle spasms  Visit Diagnosis: Other lack of coordination  Other symptoms and signs involving the musculoskeletal system  Abnormal posture  Other muscle spasm     Problem List Patient Active Problem List   Diagnosis Date Noted   . Hyperlipidemia 05/17/2017  . Perimenopausal 02/13/2016  . 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 Numbness of right foot 11/29/2015  . Osteopenia 11/29/2015  . Vitamin D insufficiency 11/29/2015  . Deafness in left ear   . Anxiety 04/08/2013  . Benign essential tremor 04/08/2013  . Carpal tunnel syndrome 04/08/2013    YJerl Mina,PT, DPT, E-RYT  02/03/2019, 1:18 PM  [facility] [address] [address]Phone: [phone]  Fax:  [phone] Name: Carolyn LIENMRN: [id number] of Birth: 309/30/1977

## 2019-02-11 ENCOUNTER — Ambulatory Visit: Payer: 59 | Admitting: Physical Therapy

## 2019-02-18 ENCOUNTER — Other Ambulatory Visit: Payer: Self-pay

## 2019-02-18 ENCOUNTER — Ambulatory Visit: Payer: 59 | Attending: Urology | Admitting: Physical Therapy

## 2019-02-18 DIAGNOSIS — R29898 Other symptoms and signs involving the musculoskeletal system: Secondary | ICD-10-CM | POA: Diagnosis not present

## 2019-02-18 DIAGNOSIS — R293 Abnormal posture: Secondary | ICD-10-CM | POA: Insufficient documentation

## 2019-02-18 DIAGNOSIS — M25611 Stiffness of right shoulder, not elsewhere classified: Secondary | ICD-10-CM | POA: Diagnosis not present

## 2019-02-18 DIAGNOSIS — M62838 Other muscle spasm: Secondary | ICD-10-CM | POA: Insufficient documentation

## 2019-02-18 DIAGNOSIS — R278 Other lack of coordination: Secondary | ICD-10-CM | POA: Diagnosis not present

## 2019-02-18 DIAGNOSIS — M25511 Pain in right shoulder: Secondary | ICD-10-CM | POA: Diagnosis not present

## 2019-02-18 NOTE — Patient Instructions (Addendum)
Consult MD about laxatives to counter sideeffect of constipation   Return the food diary for review next session  Document what ever laxative you decide on: : the length of time it induces the BM and what time during the day  to later decide when you take it and when you create a time to sit on the toilet      ( later when you wean off the laxative,  your body is used to going to the bathroom at that certain time of the time )  : food diary    ___  Entrainment practices to optimize internal signals for digestion and elimination : _ if you want to have the regular routine of pooping in the am, figure out when to take the laxative after you have observed how long it takes to have effect  _ show up  &  sit on the toilet at the same time every morning , take 5 breaths with the objective to relax and entrain the colon . If nothing comes out, get dressed, no straining, no force. This is just to entrain the time in your circadian cycle for elimination   _ hack into the nervous system of calm " rest and digest" parsympathetic --  Calming activity after eating meals, rub belly with colon massage    ___

## 2019-02-18 NOTE — Therapy (Signed)
[facility] MAIN [facility] SERVICES [address] [place], [place], [address] Phone: [phone]   Fax:  [phone]  Physical Therapy Treatment  Patient Details  Name: Carolyn [name] MRN: [id number] Date of Birth: Apr 26, 1975 No data recorded  Encounter Date: 02/18/2019  PT End of Session - 02/18/19 0947    Visit Number  12    Number of Visits  19    Date for PT Re-Evaluation  03/31/19   11/03/18 eval, progress on 01/20/19 #9   PT Start Time  0803    PT Stop Time  0900    PT Time Calculation (min)  57 min    Activity Tolerance  Patient tolerated treatment well    Behavior During Therapy  [facility] for tasks assessed/performed       Past Medical History:  Diagnosis Date  . Anemia    a. low-normal H/H  . Anxiety   . Arthritis    osteopenia  . Benign essential tremor    a. bilateral upper and lower extremities; b. followed by [facility]; c. felt to be 2/2 antidepressants   . Carpal tunnel syndrome   . Deafness in left ear   . Depression   . Mixed incontinence   . Palpitations     Past Surgical History:  Procedure Laterality Date  . axillary lymph node removal Right 1982   enlarged lymph node  . IUD REMOVAL N/A 04/23/2014   Procedure: INTRAUTERINE DEVICE (IUD) REMOVAL;  Surgeon: Harold Hedge, MD;  Location: [facility];  Service: Gynecology;  Laterality: N/A;  . LAPAROSCOPIC TUBAL LIGATION Bilateral 04/23/2014   Procedure: LAPAROSCOPIC TUBAL LIGATION with Filshie Clips;  Surgeon: Harold Hedge, MD;  Location: [facility];  Service: Gynecology;  Laterality: Bilateral;    There were no vitals filed for this visit.  Subjective Assessment - 02/18/19 0809    Subjective  Pt reported the new medication is helping pt with no frequency and much improved leakage. The medication causes constipation and will plan to discuss with MD about taking laxative.         [facility] PT Assessment - 02/18/19 0844      Observation/Other Assessments   Observations  knees adducted in  seated    Skin Integrity         Strength   Overall Strength Comments  hip abd L 3+/5 , R 4+/5  . hip ext 5/5 B ( minor oblique overuse/ less lumbar lordosis )                  Pelvic Floor Special Questions - 02/18/19 0946    External Palpation  no pelvic floor tightness noted B        [facility] Adult PT Treatment/Exercise - 02/18/19 0857      Therapeutic Activites    Other Therapeutic Activities  discussed parasympathetic nervous system training for regular bowel movements and minimizing constipation,       Neuro Re-ed    Neuro Re-ed Details   cued for proper alignment of knees in sitting to keep pelvic floor relaxed , and clam shells for L hip strengthening                   PT Long Term Goals - 02/18/19 0830      PT LONG TERM GOAL #1   Title  Pt will decrease her NIH-CPSI score from 35 % to <17  % in order to improve pelvic floor function  ( 1/18: 25% ) (2/10: 4%)  Time  10    Period  Weeks    Status  Achieved      PT LONG TERM GOAL #2   Title  Pt will report dry pad by the end of the day across 5 days in order to be continent and maintain hygiene    Time  6    Period  Weeks    Status  Achieved      PT LONG TERM GOAL #3   Title  Pt will demo increased thoracic mobility in gait to improve mobility at diaphragm forb etter pelvic floor mobility.    Time  2    Period  Weeks    Status  Achieved      PT LONG TERM GOAL #4   Title  earlobe to acromium 21 cm B to increase to 22 cm B to minimize forward head posture and optimize deep core function    Time  10    Period  Weeks    Status  Achieved      PT LONG TERM GOAL #5   Title  Pt will demo increased  R thoracic rotation from 40% to 60%  in order to minimze L posterior rotation of pelvis and promote better pelvic functions    Time  2    Period  Weeks    Status  Achieved      PT LONG TERM GOAL #6   Title  Pt will report complete emptying 100% of the time with urination to minimize risks for  prolapse of the bladder  ( Dec : 75% of the time) ( Feb 10:  100%)    Time  8    Period  Weeks    Status  Achieved      PT LONG TERM GOAL #7   Title  Pt will demo increased PF strength without UE support on wall > 10 reps B in order to minimize overuse of pelvic floor and optimize lower kinetic chain in balance and stepping  ( 2/10: R 25 reps,    Time  10    Period  Weeks    Status  Achieved      PT LONG TERM GOAL #8   Title  Pt will demo proper coordination of pelvic floor low abdominal mm co-activation with simulated cough without straining/ pushing of abdomen outward and without cues required in order to minimize SUI    Time  5    Period  Weeks    Status  Achieved      PT LONG TERM GOAL  #9   TITLE  Pt will demo more cranial position of urethra and bladder with pelvic floor strength 4/5 and progress from 3 sec, 4 reps contractions to  5 sec, 5 reps in order to minimize lowered pelvic organs and better elimination of urine    Time  10    Period  Weeks    Status  On going            Plan - 02/18/19 0947    Clinical Impression Statement  Pt demo'd good carry over with no pelvic floor tightness, increased plantarflexion strength, decreased NIH-CPSI score. Pt's new urinary medication is helping with her leakage and frequency but it causes constipation which it was a problem for pt last year. Discussed bowel retraining strategies and recommended discussing with her doctor laxative options. Remaining areas of focus include strengthening LLE with more repetitions of PF, L hip abduction which is being addressed with new HEP.  Plan to add fitness exercises at next session before d/c. Pt benefits from skilled PT.     Examination-Activity Limitations  Toileting    Stability/Clinical Decision Making  Stable/Uncomplicated    Rehab Potential  Good    PT Frequency  1x / week    PT Duration  --   10   PT Treatment/Interventions  ADLs/Self Care Home Management;Electrical  Stimulation;Cryotherapy;Ultrasound;Traction;Moist Heat;Iontophoresis 4mg /ml Dexamethasone;Functional mobility training;Therapeutic activities;Therapeutic exercise;Patient/family education;Neuromuscular re-education;Manual techniques;Scar mobilization;Passive range of motion;Visual/perceptual remediation/compensation;Taping;Splinting;Energy conservation;Dry needling;Biofeedback;Aquatic Therapy;Manual lymph drainage;Balance training;Stair training    Consulted and Agree with Plan of Care  Patient       Patient will benefit from skilled therapeutic intervention in order to improve the following deficits and impairments:  Abnormal gait, Decreased activity tolerance, Decreased coordination, Decreased range of motion, Decreased strength, Hypomobility, Impaired flexibility, Impaired perceived functional ability, Impaired UE functional use, Postural dysfunction, Improper body mechanics, Pain, Decreased mobility, Decreased scar mobility, Increased muscle spasms  Visit Diagnosis: Other lack of coordination  Other symptoms and signs involving the musculoskeletal system  Abnormal posture  Other muscle spasm     Problem List Patient Active Problem List   Diagnosis Date Noted  . Hyperlipidemia 05/17/2017  . Perimenopausal 02/13/2016  . 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 Numbness of right foot 11/29/2015  . Osteopenia 11/29/2015  . Vitamin D insufficiency 11/29/2015  . Deafness in left ear   . Anxiety 04/08/2013  . Benign essential tremor 04/08/2013  . Carpal tunnel syndrome 04/08/2013    06/08/2013 ,PT, DPT, E-RYT  02/18/2019, 9:52 AM  [place] [facility] MAIN [facility] SERVICES [address] [place], [place], [place] Phone: [phone]   Fax:  [phone]  Name: Carolyn [name] MRN: [name] Date of Birth: 04-02-1975

## 2019-02-23 ENCOUNTER — Encounter: Payer: Self-pay | Admitting: Urology

## 2019-02-23 ENCOUNTER — Other Ambulatory Visit: Payer: Self-pay

## 2019-02-23 ENCOUNTER — Ambulatory Visit (INDEPENDENT_AMBULATORY_CARE_PROVIDER_SITE_OTHER): Payer: 59 | Admitting: Urology

## 2019-02-23 VITALS — BP 138/81 | HR 118 | Wt 194.0 lb

## 2019-02-23 DIAGNOSIS — N3946 Mixed incontinence: Secondary | ICD-10-CM

## 2019-02-23 DIAGNOSIS — R350 Frequency of micturition: Secondary | ICD-10-CM

## 2019-02-23 DIAGNOSIS — F322 Major depressive disorder, single episode, severe without psychotic features: Secondary | ICD-10-CM | POA: Diagnosis not present

## 2019-02-23 LAB — BLADDER SCAN AMB NON-IMAGING: Scan Result: 6

## 2019-02-23 MED ORDER — SOLIFENACIN SUCCINATE 5 MG PO TABS: 5.0000 mg | ORAL_TABLET | Freq: Every day | ORAL | 11 refills | Status: DC

## 2019-02-23 NOTE — Progress Notes (Signed)
02/23/2019 12:49 PM   Carolyn [name] 12-27-1975 [id number]  Referring provider: Smitty Cords, DO [address] [place],  [place] [address]  No chief complaint on file.   HPI: Carolyn [name]: Patient had urodynamics.  She was catheterized for 320 mL but had not voided for 2 and half hours.  Maximum bladder capacity was 790 mL.  Patient had low pressure overactivity and did not leak.  No stress incontinence reaching a pressure 156 cm of water.  Voided with an increased pressure of 30 cm of water and residual was 197 mL.  Contractions were not well sustained and she was generating low pressure contractions and not voiding.  She finally did generate the contraction of higher pressure and had to strain to initiate it.  Bladder neck descended 2 cm but no cystocele noted  Postvoid residual in the office was 5 mL and was given Toviaz.  She had initially done well with Myrbetriq and physical therapy.  Another residual was 153 mL.  Last visit was given trospium  Today Prior to trospium patient would void approximately every 90 minutes to 2 hours.  She had urge incontinence.  She would leak with coughing sneezing but not bending lifting.  No bedwetting.  If she crossed her leg from right to left she would leak.  She was wearing 2 or 3 pads a day damp to moderately wet.  Sometimes she leaks a small amount without awareness  Since being on trospium she no longer leaks voiding every 2 hours.  It worsened her chronic constipation  Her flow is slower than it used to be and she feels empty  Sometimes she felt she did feel a bulge in the vagina after a bowel movement but no longer does since physical therapy  She could not void well with Myrbetriq.  Gala Murdoch helped some but caused constipation and dry mouth.  Residual today 6 mL  On pelvic exam patient had rotational descent of the bladder neck and anterior vaginal wall but no cystocele.  No stress incontinence.  No rectocele.  PMH: Past Medical History:    Diagnosis Date  . Anemia    a. low-normal H/H  . Anxiety   . Arthritis    osteopenia  . Benign essential tremor    a. bilateral upper and lower extremities; b. followed by [facility]; c. felt to be 2/2 antidepressants   . Carpal tunnel syndrome   . Deafness in left ear   . Depression   . Mixed incontinence   . Palpitations     Surgical History: Past Surgical History:  Procedure Laterality Date  . axillary lymph node removal Right 1982   enlarged lymph node  . IUD REMOVAL N/A 04/23/2014   Procedure: INTRAUTERINE DEVICE (IUD) REMOVAL;  Surgeon: Harold Hedge, MD;  Location: [facility];  Service: Gynecology;  Laterality: N/A;  . LAPAROSCOPIC TUBAL LIGATION Bilateral 04/23/2014   Procedure: LAPAROSCOPIC TUBAL LIGATION with Filshie Clips;  Surgeon: Harold Hedge, MD;  Location: [facility];  Service: Gynecology;  Laterality: Bilateral;    Home Medications:  Allergies as of 02/23/2019      Reactions   Latex Itching, Rash   sensitivity      Medication List       Accurate as of February 23, 2019 12:49 PM. If you have any questions, ask your nurse or doctor.        clonazePAM 0.5 MG tablet Commonly known as: KLONOPIN Take 0.5 mg by mouth 2 (two) times daily as needed for  anxiety.   lamoTRIgine 200 MG tablet Commonly known as: LAMICTAL   MULTIVITAMIN PO Take 1 tablet by mouth daily.   primidone 50 MG tablet Commonly known as: MYSOLINE Take 100 mg by mouth 2 (two) times daily.   Rexulti 3 MG Tabs Generic drug: Brexpiprazole Take 1 tablet by mouth daily.   sertraline 100 MG tablet Commonly known as: ZOLOFT Take 200 mg by mouth daily.   traZODone 100 MG tablet Commonly known as: DESYREL   Trospium Chloride 60 MG Cp24 Take 1 capsule (60 mg total) by mouth daily.   venlafaxine XR 150 MG 24 hr capsule Commonly known as: EFFEXOR-XR   Vitamin D3 50 MCG (2000 UT) Tabs Take 2,000 Units by mouth daily.   vitamin E 1000 UNIT capsule       Allergies:  Allergies  Allergen  Reactions  . Latex Itching and Rash    sensitivity    Family History: Family History  Problem Relation Age of Onset  . Depression Mother        bipolar disorder  . Arthritis Mother        osteoprosis  . Heart disease Father   . Heart attack Father 49  . Heart disease Paternal Grandmother   . Cancer Paternal Grandmother        breast cancer  . Colon cancer Neg Hx   . Bladder Cancer Neg Hx   . Kidney cancer Neg Hx     Social History:  reports that she has never smoked. She has never used smokeless tobacco. She reports current alcohol use of about 3.0 standard drinks of alcohol per week. She reports that she does not use drugs.  ROS:                                        Physical Exam: There were no vitals taken for this visit.  Constitutional:  Alert and oriented, No acute distress.  Laboratory Data: Lab Results  Component Value Date   WBC 5.6 07/22/2018   HGB 12.5 07/22/2018   HCT 37.5 07/22/2018   MCV 98.4 07/22/2018   PLT 316 07/22/2018    Lab Results  Component Value Date   CREATININE 0.66 07/22/2018    No results found for: PSA  No results found for: TESTOSTERONE  Lab Results  Component Value Date   HGBA1C 5.1 07/22/2018    Urinalysis    Component Value Date/Time   COLORURINE Straw 04/19/2012 1024   APPEARANCEUR Cloudy (A) 01/13/2018 1006   LABSPEC 1.005 04/19/2012 1024   PHURINE 7.0 04/19/2012 1024   GLUCOSEU Negative 01/13/2018 1006   GLUCOSEU Negative 04/19/2012 1024   HGBUR 1+ 04/19/2012 1024   BILIRUBINUR Negative 01/13/2018 1006   BILIRUBINUR Negative 04/19/2012 1024   KETONESUR Negative 04/19/2012 1024   PROTEINUR Negative 01/13/2018 1006   PROTEINUR Negative 04/19/2012 1024   NITRITE Negative 01/13/2018 1006   NITRITE Negative 04/19/2012 1024   LEUKOCYTESUR 2+ (A) 01/13/2018 1006   LEUKOCYTESUR Trace 04/19/2012 1024    Pertinent Imaging:   Assessment & Plan: Patient has urge incontinence and leakage  without awareness.  By history she has very mild stress incontinence but none on physical examination or on urodynamics.  She has a large capacity bladder and it has some tendency not to empty well.  Her leakage without awareness is due to bladder overactivity.  This is also supported that trospium has made  her continent.  She certainly should not have a sling and if her mild stress incontinence was ever treated I would recommend a bulking agent.  A picture was drawn about the rotational descent and prolapse-like symptoms.  Picture was drawn.  Our goal is to find hopefully medication that would have side effects and work as well as trospium.  She will verbally update me in about 5 weeks on Vesicare 5 mg.  She will wait a week until her bowel movements have normalized.  She is taking regular laxatives for her worsening constipation  There are no diagnoses linked to this encounter.  No follow-ups on file.  Reece Packer, MD  [place] [address], [place], [place] [address] [phone]

## 2019-02-25 ENCOUNTER — Ambulatory Visit: Payer: 59 | Admitting: Physical Therapy

## 2019-02-25 ENCOUNTER — Other Ambulatory Visit: Payer: Self-pay

## 2019-02-25 DIAGNOSIS — M62838 Other muscle spasm: Secondary | ICD-10-CM

## 2019-02-25 DIAGNOSIS — R293 Abnormal posture: Secondary | ICD-10-CM | POA: Diagnosis not present

## 2019-02-25 DIAGNOSIS — R278 Other lack of coordination: Secondary | ICD-10-CM

## 2019-02-25 DIAGNOSIS — M25611 Stiffness of right shoulder, not elsewhere classified: Secondary | ICD-10-CM | POA: Diagnosis not present

## 2019-02-25 DIAGNOSIS — M25511 Pain in right shoulder: Secondary | ICD-10-CM | POA: Diagnosis not present

## 2019-02-25 DIAGNOSIS — R29898 Other symptoms and signs involving the musculoskeletal system: Secondary | ICD-10-CM | POA: Diagnosis not present

## 2019-02-25 NOTE — Patient Instructions (Addendum)
Minisquat with BLUE BAND  With biceps  Scoot buttocks back slight, hinge like you are looking at your reflection on a pond  Knees behind toes,  Inhale to "smell flowers"  Exhale on the rise "like rocket"  Do not lock knees, have more weight across ballmounds of feet, toes relaxed   Stand up and then ADD BICEP CURLS 10 reps x 3 x day       Walking with band, body face away from door Pressing band down and puffing back, trunk leans slightly, hands by pockets , thumbs out Marching with thigh high exhaling on lift, placing foot back down under hips 90 sec x 3   Multifidis twist  Band is on doorknob: stand further away from door (facing perpendicular)   Twisting trunk without moving the hips and knees Hold band at the level of ribcage, elbows bent,shoulder blades roll back and down like squeezing a pencil under armpit    Exhale twist,.10-15 deg away from door without moving your hips/ knees. Continue to maintain equal weight through legs. Keep knee unlocked.  10 x 2       Lower body      Stand parallel to wall, to push with forearm against    20 reps  Kick back, see-saw    Letter T, seeasaw on one leg with band band under L foot, wrap by big toe then, outer knee/ thigh, L hand pulling ( elbow by side)  Plant ballmound, toes spread, thigh out against band,, tracking knee in line with 2-3rd toe line Dipping forward, R foot lifts slight ( whole body like a see saw) off floor or  Press back foot against wall 20 reps  Both    ______  Stretches afterwards  Modified thread the needle the door knob    Side stretch at the door knob   _____  Raise your desk at work with paper reams to see if 2" willwork  So you can sit more comfortable and the desk not so low

## 2019-02-25 NOTE — Therapy (Signed)
[place] MAIN [facility] SERVICES [place], [place], [address] Phone: [phone]   Fax:  [phone]  Physical Therapy Treatment / Discharge Summary    Patient Details  Name: Carolyn [name] MRN: [id number] Date of Birth: 1975/03/04 No data recorded  Encounter Date: 02/25/2019  PT End of Session - 02/25/19 0819    Visit Number  13    Number of Visits  19    Date for PT Re-Evaluation  03/31/19   11/03/18 eval, progress on 01/20/19 #9   PT Start Time  0800    PT Stop Time  0900    PT Time Calculation (min)  60 min    Activity Tolerance  Patient tolerated treatment well    Behavior During Therapy  [facility] for tasks assessed/performed       Past Medical History:  Diagnosis Date  . Anemia    a. low-normal H/H  . Anxiety   . Arthritis    osteopenia  . Benign essential tremor    a. bilateral upper and lower extremities; b. followed by [facility]; c. felt to be 2/2 antidepressants   . Carpal tunnel syndrome   . Deafness in left ear   . Depression   . Mixed incontinence   . Palpitations     Past Surgical History:  Procedure Laterality Date  . axillary lymph node removal Right 1982   enlarged lymph node  . IUD REMOVAL N/A 04/23/2014   Procedure: INTRAUTERINE DEVICE (IUD) REMOVAL;  Surgeon: Everlene Farrier, MD;  Location: [facility];  Service: Gynecology;  Laterality: N/A;  . LAPAROSCOPIC TUBAL LIGATION Bilateral 04/23/2014   Procedure: LAPAROSCOPIC TUBAL LIGATION with Filshie Clips;  Surgeon: Everlene Farrier, MD;  Location: [facility];  Service: Gynecology;  Laterality: Bilateral;    There were no vitals filed for this visit.  Subjective Assessment - 02/25/19 0820    Subjective  Pt is changing medications once again because the one she started last week caused dryness in her mouth and constipation. Pt is trying Vesicare this week.         [facility] PT Assessment - 02/25/19 0916      Squat   Comments  knees anterior to toes ( required cues)        Palpation   Palpation comment  fascial restrictions over L LQ abdominal area ( improved post Tx)                    [facility] Adult PT Treatment/Exercise - 02/25/19 6644      Neuro Re-ed    Neuro Re-ed Details   cued for alignment and technique  (see new HEP)       Manual Therapy   Manual therapy comments  fascial releases over L LQ of abdomen                   PT Long Term Goals - 02/25/19 0900      PT LONG TERM GOAL #1   Title  Pt will decrease her NIH-CPSI score from 35 % to <17  % in order to improve pelvic floor function  ( 1/18: 25% ) (2/10: 4%)    Time  10    Period  Weeks    Status  Achieved      PT LONG TERM GOAL #2   Title  Pt will report dry pad by the end of the day across 5 days in order to be continent and maintain hygiene  Time  6    Period  Weeks    Status  Achieved      PT LONG TERM GOAL #3   Title  Pt will demo increased thoracic mobility in gait to improve mobility at diaphragm forbetter pelvic floor mobility.    Time  2    Period  Weeks    Status  Achieved      PT LONG TERM GOAL #4   Title  earlobe to acromium 21 cm B to increase to 22 cm B to minimize forward head posture and optimize deep core function    Time  10    Period  Weeks    Status  Achieved      PT LONG TERM GOAL #5   Title  Pt will demo increased  R thoracic rotation from 40% to 60%  in order to minimze L posterior rotation of pelvis and promote better pelvic functions    Time  2    Period  Weeks    Status  Achieved      PT LONG TERM GOAL #6   Title  Pt will report complete emptying 100% of the time with urination to minimize risks for prolapse of the bladder  ( Dec : 75% of the time) ( Feb 10:  100%)    Time  8    Period  Weeks    Status  Achieved      PT LONG TERM GOAL #7   Title  Pt will demo increased PF strength without UE support on wall > 10 reps B in order to minimize overuse of pelvic floor and optimize lower kinetic chain in balance and stepping  (  2/10: R 25 reps,    Time  10    Period  Weeks    Status  Achieved      PT LONG TERM GOAL #8   Title  Pt will demo proper coordination of pelvic floor low abdominal mm co-activation with simulated cough without straining/ pushing of abdomen outward and without cues required in order to minimize SUI    Time  5    Period  Weeks    Status  Achieved      PT LONG TERM GOAL  #9   TITLE  Pt will demo more cranial position of urethra and bladder with pelvic floor strength 4/5 and progress from 3 sec, 4 reps contractions to  5 sec, 5 reps in order to minimize lowered pelvic organs and better elimination of urine    Time  10    Period  Weeks    Status  Achieved            Plan - 02/25/19 0819    Clinical Impression Statement Across the past 13 visits, pt has achieved 100% of her goals with the following improvements: _ decreased pelvic floor mm tightness, improved lengthening to complete urination _improved behaviorial changes to not delay urination  _IND with stretches that lengthen back / hips/ pelvic floor _improved lower kinetic chain co-activation to minimize overactivity of pelvic floor  _decreased of NIH-CPSI score from 35 % to 4% , indication of improved pelvic floor function _improved posture and less forward head posture , improved thoracic rotation which enhanced deep core coordination _more cranial position of urethra and bladder with improved coordination/ strength of  pelvic floor contractions   Pt continues to work with urologist on a medication that will help with elimination of urine with less side effects of constipation. Pt relies  on laxatives currently while pt also has made positive changes with intake of water and fiber to assist with bowel movements.   Pt was educated on fitness routines that support pelvic health. Pt is ready for d/c at this time.  _   Examination-Activity Limitations  Toileting    Stability/Clinical Decision Making  Stable/Uncomplicated    Rehab  Potential  Good    PT Frequency  1x / week    PT Duration  --   10   PT Treatment/Interventions  ADLs/Self Care Home Management;Electrical Stimulation;Cryotherapy;Ultrasound;Traction;Moist Heat;Iontophoresis 4mg /ml Dexamethasone;Functional mobility training;Therapeutic activities;Therapeutic exercise;Patient/family education;Neuromuscular re-education;Manual techniques;Scar mobilization;Passive range of motion;Visual/perceptual remediation/compensation;Taping;Splinting;Energy conservation;Dry needling;Biofeedback;Aquatic Therapy;Manual lymph drainage;Balance training;Stair training    Consulted and Agree with Plan of Care  Patient       Patient will benefit from skilled therapeutic intervention in order to improve the following deficits and impairments:  Abnormal gait, Decreased activity tolerance, Decreased coordination, Decreased range of motion, Decreased strength, Hypomobility, Impaired flexibility, Impaired perceived functional ability, Impaired UE functional use, Postural dysfunction, Improper body mechanics, Pain, Decreased mobility, Decreased scar mobility, Increased muscle spasms  Visit Diagnosis: Other lack of coordination  Other symptoms and signs involving the musculoskeletal system  Abnormal posture  Other muscle spasm  Acute pain of right shoulder  Stiffness of right shoulder, not elsewhere classified     Problem List Patient Active Problem List   Diagnosis Date Noted  . Hyperlipidemia 05/17/2017  . Perimenopausal 02/13/2016  . 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 Numbness of right foot 11/29/2015  . Osteopenia 11/29/2015  . Vitamin D insufficiency 11/29/2015  . Deafness in left ear   . Anxiety 04/08/2013  . Benign essential tremor 04/08/2013  . Carpal tunnel syndrome 04/08/2013    06/08/2013  ,PT, DPT, E-RYT  02/25/2019, 9:38 AM  [place] [facility] MAIN [facility] SERVICES [address]  [place], [place], [place] Phone: [phone]   Fax:  [phone]  Name: Carolyn [name] MRN: [name] Date of Birth: 08-15-1975

## 2019-03-09 DIAGNOSIS — F322 Major depressive disorder, single episode, severe without psychotic features: Secondary | ICD-10-CM | POA: Diagnosis not present

## 2019-03-25 DIAGNOSIS — F322 Major depressive disorder, single episode, severe without psychotic features: Secondary | ICD-10-CM | POA: Diagnosis not present

## 2019-04-06 DIAGNOSIS — F322 Major depressive disorder, single episode, severe without psychotic features: Secondary | ICD-10-CM | POA: Diagnosis not present

## 2019-04-14 DIAGNOSIS — F332 Major depressive disorder, recurrent severe without psychotic features: Secondary | ICD-10-CM | POA: Diagnosis not present

## 2019-04-14 DIAGNOSIS — F900 Attention-deficit hyperactivity disorder, predominantly inattentive type: Secondary | ICD-10-CM | POA: Diagnosis not present

## 2019-04-14 DIAGNOSIS — F410 Panic disorder [episodic paroxysmal anxiety] without agoraphobia: Secondary | ICD-10-CM | POA: Diagnosis not present

## 2019-04-22 DIAGNOSIS — F322 Major depressive disorder, single episode, severe without psychotic features: Secondary | ICD-10-CM | POA: Diagnosis not present

## 2019-05-18 DIAGNOSIS — H5213 Myopia, bilateral: Secondary | ICD-10-CM | POA: Diagnosis not present

## 2019-05-19 DIAGNOSIS — F419 Anxiety disorder, unspecified: Secondary | ICD-10-CM | POA: Diagnosis not present

## 2019-05-27 DIAGNOSIS — F322 Major depressive disorder, single episode, severe without psychotic features: Secondary | ICD-10-CM | POA: Diagnosis not present

## 2019-06-05 DIAGNOSIS — F419 Anxiety disorder, unspecified: Secondary | ICD-10-CM | POA: Diagnosis not present

## 2019-06-09 DIAGNOSIS — F419 Anxiety disorder, unspecified: Secondary | ICD-10-CM | POA: Diagnosis not present

## 2019-06-12 DIAGNOSIS — F419 Anxiety disorder, unspecified: Secondary | ICD-10-CM | POA: Diagnosis not present

## 2019-06-15 DIAGNOSIS — F322 Major depressive disorder, single episode, severe without psychotic features: Secondary | ICD-10-CM | POA: Diagnosis not present

## 2019-07-20 ENCOUNTER — Other Ambulatory Visit: Payer: Self-pay | Admitting: Psychiatry

## 2019-07-20 DIAGNOSIS — F410 Panic disorder [episodic paroxysmal anxiety] without agoraphobia: Secondary | ICD-10-CM | POA: Diagnosis not present

## 2019-07-20 DIAGNOSIS — F900 Attention-deficit hyperactivity disorder, predominantly inattentive type: Secondary | ICD-10-CM | POA: Diagnosis not present

## 2019-07-20 DIAGNOSIS — F332 Major depressive disorder, recurrent severe without psychotic features: Secondary | ICD-10-CM | POA: Diagnosis not present

## 2019-07-24 DIAGNOSIS — F419 Anxiety disorder, unspecified: Secondary | ICD-10-CM | POA: Diagnosis not present

## 2019-07-27 DIAGNOSIS — F322 Major depressive disorder, single episode, severe without psychotic features: Secondary | ICD-10-CM | POA: Diagnosis not present

## 2019-07-30 ENCOUNTER — Other Ambulatory Visit: Payer: Self-pay | Admitting: Neurology

## 2019-07-30 DIAGNOSIS — G250 Essential tremor: Secondary | ICD-10-CM | POA: Diagnosis not present

## 2019-07-30 DIAGNOSIS — R413 Other amnesia: Secondary | ICD-10-CM | POA: Diagnosis not present

## 2019-08-07 ENCOUNTER — Telehealth (INDEPENDENT_AMBULATORY_CARE_PROVIDER_SITE_OTHER): Payer: 59 | Admitting: Family Medicine

## 2019-08-07 ENCOUNTER — Other Ambulatory Visit: Payer: Self-pay

## 2019-08-07 ENCOUNTER — Encounter: Payer: Self-pay | Admitting: Family Medicine

## 2019-08-07 DIAGNOSIS — Z20822 Contact with and (suspected) exposure to covid-19: Secondary | ICD-10-CM | POA: Diagnosis not present

## 2019-08-07 DIAGNOSIS — J209 Acute bronchitis, unspecified: Secondary | ICD-10-CM | POA: Diagnosis not present

## 2019-08-07 MED ORDER — BENZONATATE 100 MG PO CAPS: 100.0000 mg | ORAL_CAPSULE | Freq: Three times a day (TID) | ORAL | 0 refills | Status: DC | PRN

## 2019-08-07 MED ORDER — PREDNISONE 50 MG PO TABS: 50.0000 mg | ORAL_TABLET | Freq: Every day | ORAL | 0 refills | Status: DC

## 2019-08-07 MED ORDER — AZITHROMYCIN 250 MG PO TABS: ORAL_TABLET | ORAL | 0 refills | Status: DC

## 2019-08-07 NOTE — Progress Notes (Signed)
Virtual Visit via Telephone The purpose of this virtual visit is to provide medical care while limiting exposure to the novel coronavirus (COVID19) for both patient and office staff.  Consent was obtained for phone visit:  Yes.   Answered questions that patient had about telehealth interaction:  Yes.   I discussed the limitations, risks, security and privacy concerns of performing an evaluation and management service by telephone. I also discussed with the patient that there may be a patient responsible charge related to this service. The patient expressed understanding and agreed to proceed.  Patient Location: Home Provider Location: [facility])  ---------------------------------------------------------------------- Chief Complaint  Patient presents with  . Nasal Congestion    chest congestion, SOB while talking, cough denies chill, fever and bodyache--onset 4 days yellowish mucus, rattling cough    S: Reviewed CMA documentation. I have called patient and gathered additional HPI as follows:  Chest Congestion / Bronchitis Exposure to COVID Reports that symptoms started 4 days ago approximately with productive cough, having rattling in chest and throat, with sputum thicker yellow darker yellow. Admitsi dyspnea at times. - Tried OTC Robitussin temporary relief - No known sick contacts, 2 of coworkers have COVID, diagnosed last week. No close contact - she does not believe that they were vaccinated. Admits difficulty with coughing spells at night  Up to date on COVID19 vaccine 01/05/20, 01/23/19  Patient is currently working   Denies any fevers, chills, sweats, body ache, sinus pain or pressure, headache, abdominal pain, diarrhea  Past Medical History:  Diagnosis Date  . Anemia    a. low-normal H/H  . Anxiety   . Arthritis    osteopenia  . Benign essential tremor    a. bilateral upper and lower extremities; b. followed by [facility]; c. felt to be 2/2  antidepressants   . Carpal tunnel syndrome   . Deafness in left ear   . Depression   . Mixed incontinence   . Palpitations    Social History   Tobacco Use  . Smoking status: Never Smoker  . Smokeless tobacco: Never Used  Vaping Use  . Vaping Use: Never used  Substance Use Topics  . Alcohol use: Yes    Alcohol/week: 3.0 standard drinks    Types: 3 Cans of beer per week  . Drug use: No    Current Outpatient Medications:  .  Brexpiprazole (REXULTI) 3 MG TABS, Take 1 tablet by mouth daily., Disp: , Rfl:  .  Cholecalciferol (VITAMIN D3) 2000 units TABS, Take 2,000 Units by mouth daily., Disp: , Rfl:  .  clonazePAM (KLONOPIN) 0.5 MG tablet, Take 0.5 mg by mouth 2 (two) times daily as needed for anxiety., Disp: , Rfl:  .  lamoTRIgine (LAMICTAL) 200 MG tablet, , Disp: , Rfl: 1 .  Multiple Vitamins-Minerals (MULTIVITAMIN PO), Take 1 tablet by mouth daily., Disp: , Rfl:  .  primidone (MYSOLINE) 50 MG tablet, Take 100 mg by mouth 2 (two) times daily., Disp: , Rfl:  .  sertraline (ZOLOFT) 100 MG tablet, Take 200 mg by mouth daily., Disp: , Rfl:  .  traZODone (DESYREL) 100 MG tablet, , Disp: , Rfl: 0 .  venlafaxine XR (EFFEXOR-XR) 150 MG 24 hr capsule, , Disp: , Rfl: 0 .  Vibegron (GEMTESA) 75 MG TABS, , Disp: , Rfl:  .  azithromycin (ZITHROMAX Z-PAK) 250 MG tablet, Take 2 tabs (500mg  total) on Day 1. Take 1 tab (250mg ) daily for next 4 days., Disp: 6 tablet, Rfl: 0 .  benzonatate (TESSALON) 100 MG capsule, Take 1 capsule (100 mg total) by mouth 3 (three) times daily as needed for cough., Disp: 30 capsule, Rfl: 0 .  predniSONE (DELTASONE) 50 MG tablet, Take 1 tablet (50 mg total) by mouth daily with breakfast., Disp: 5 tablet, Rfl: 0 .  propranolol (INDERAL) 10 MG tablet, Take 10 mg by mouth 2 (two) times daily., Disp: , Rfl:  .  solifenacin (VESICARE) 5 MG tablet, Take 1 tablet (5 mg total) by mouth daily., Disp: 30 tablet, Rfl: 11 .  vitamin E 1000 UNIT capsule, , Disp: , Rfl:    Depression screen [facility] 2/9 07/29/2018 10/28/2017 07/02/2017  Decreased Interest 0 0 0  Down, Depressed, Hopeless 0 0 1  PHQ - 2 Score 0 0 1  Altered sleeping 1 0 0  Tired, decreased energy 1 0 1  Change in appetite 0 0 0  Feeling bad or failure about yourself  0 0 0  Trouble concentrating 1 0 1  Moving slowly or fidgety/restless 0 0 0  Suicidal thoughts 0 0 0  PHQ-9 Score 3 0 3  Difficult doing work/chores Not difficult at all Not difficult at all Not difficult at all    GAD 7 : Generalized Anxiety Score 07/29/2018 07/02/2017  Nervous, Anxious, on Edge 0 0  Control/stop worrying 0 0  Worry too much - different things 0 0  Trouble relaxing 1 1  Restless 0 0  Easily annoyed or irritable 0 0  Afraid - awful might happen 0 0  Total GAD 7 Score 1 1  Anxiety Difficulty Not difficult at all Not difficult at all    -------------------------------------------------------------------------- O: No physical exam performed due to remote telephone encounter.  Lab results reviewed.  No results found for this or any previous visit (from the past 2160 hour(s)).  -------------------------------------------------------------------------- A&P:  Problem List Items Addressed This Visit    None    Visit Diagnoses    Acute bronchitis, unspecified organism    -  Primary   Relevant Medications   azithromycin (ZITHROMAX Z-PAK) 250 MG tablet   benzonatate (TESSALON) 100 MG capsule   predniSONE (DELTASONE) 50 MG tablet   Close exposure to COVID-19 virus         Consistent with worsening bronchitis in setting of likely viral URI (+sick contacts) Cannot rule out COVID given exposure, however reassuring she has been fully vaccinated, she did have exposure indirect, not close contact with coworkers tested positive within past 1 week  Limited by virtual appointment today She is afebrile by report Episodic coughing dyspnea spells worse at night Productive sputum as described No known history of  asthma or COPD. Never smoker  Plan: 1. Start Azithromycin Z-pak dosing 500mg  then 250mg  daily x 4 days 2.  Start Prednisone 50mg  daily x 5 days burst treatment given bronchospasm 3. Start Tessalon Perls take 1 capsule up to 3 times a day as needed for cough  Additionally offered Albuterol inhaler PRN - never used before, would defer this for now. Consider CXR - offered to order, will defer this for now as well, may check CXR within 7-10 days or sooner if not improving.  Emphasis on importance of COVID19 testing promptly today. Gave her info for [facility] testing, she should no longer be at work currently until cleared with negative testing.  Note written out of work up to 1 week unless cleared with negative COVID test.  Return criteria reviewed, follow-up within 1 week if not improved   Meds  ordered this encounter  Medications  . azithromycin (ZITHROMAX Z-PAK) 250 MG tablet    Sig: Take 2 tabs (500mg  total) on Day 1. Take 1 tab (250mg ) daily for next 4 days.    Dispense:  6 tablet    Refill:  0  . benzonatate (TESSALON) 100 MG capsule    Sig: Take 1 capsule (100 mg total) by mouth 3 (three) times daily as needed for cough.    Dispense:  30 capsule    Refill:  0  . predniSONE (DELTASONE) 50 MG tablet    Sig: Take 1 tablet (50 mg total) by mouth daily with breakfast.    Dispense:  5 tablet    Refill:  0    Follow-up: - Return in 1 week if not improved   Patient verbalizes understanding with the above medical recommendations including the limitation of remote medical advice.  Specific follow-up and call-back criteria were given for patient to follow-up or seek medical care more urgently if needed.   - Time spent in direct consultation with patient on phone: 10 minutes  , DO [facility] 08/07/2019, 11:45 AM

## 2019-08-07 NOTE — Patient Instructions (Addendum)
Thank you for coming to the office today.  1. It sounds like you had an Upper Respiratory Virus that has settled into a Bronchitis, lower respiratory tract infection. I don't have concerns for pneumonia today, and think that this should gradually improve. Once you are feeling better, the cough may take a few weeks to fully resolve.  Start Azithromycin Z pak (antibiotic) 2 tabs day 1, then 1 tab x 4 days, complete entire course even if improved Start Tessalon Perls take 1 capsule up to 3 times a day as needed for cough  - Start Prednisone 50mg  daily for next 5 days - this will open up lungs allow you to breath better and treat that wheezing or bronchospasm  - Use nasal saline (Simply Saline or Ocean Spray) to flush nasal congestion multiple times a day, may help cough - Drink plenty of fluids to improve congestion  If your symptoms seem to worsen instead of improve over next several days, including significant fever / chills, worsening shortness of breath, worsening wheezing, or nausea / vomiting and can't take medicines - return sooner or go to hospital Emergency Department for more immediate treatment.    You may have coronavirus / COVID19  [facility] COVID19 Testing Information  COVID-19 Testing By Appointment Only  Online scheduling can be done online at or by texting "COVID" to [phone].  Test result may take 2-7 days to result. You will be notified by MyChart or by Phone.  (Pre-Admission prior to surgery or procedures at hospital will still be done at [facility])  If negative test - they will call you with result. If abnormal or positive test you will be notified as well and our office will contact you to help further with treatment plan.  May take Tylenol as needed for aches pains and fever. Prefer to avoid Ibuprofen if can help it, to avoid complication from virus.  REQUIRED self quarantine to AVOID POTENTIAL SPREAD - advised to avoid  all exposure with others while during treatment. Should continue to quarantine for up to 7-14 days, pending resolution of symptoms, if symptoms resolve by 7 days and is afebrile >3 days - may STOP self quarantine at that time.  If symptoms do not resolve or significantly improve OR if WORSENING - fever / cough - or worsening shortness of breath - then should contact [facility] and seek advice on next steps in treatment at home vs where/when to seek care at Urgent Care or Hospital ED for further intervention   Please schedule a Follow-up Appointment to: Return in about 10 days (around 08/17/2019), or if symptoms worsen or fail to improve, for bronchitis URI if not improved.  If you have any other questions or concerns, please feel free to call the office or send a message through MyChart. You may also schedule an earlier appointment if necessary.  Additionally, you may be receiving a survey about your experience at our office within a few days to 1 week by e-mail or mail. We value your feedback.  10/17/2019, DO [facility], [facility]

## 2019-09-24 DIAGNOSIS — F322 Major depressive disorder, single episode, severe without psychotic features: Secondary | ICD-10-CM | POA: Diagnosis not present

## 2019-10-08 DIAGNOSIS — M79672 Pain in left foot: Secondary | ICD-10-CM | POA: Diagnosis not present

## 2019-10-09 ENCOUNTER — Other Ambulatory Visit: Payer: Self-pay | Admitting: Podiatry

## 2019-10-16 ENCOUNTER — Other Ambulatory Visit: Payer: Self-pay | Admitting: Psychiatry

## 2019-10-16 DIAGNOSIS — F410 Panic disorder [episodic paroxysmal anxiety] without agoraphobia: Secondary | ICD-10-CM | POA: Diagnosis not present

## 2019-10-16 DIAGNOSIS — F900 Attention-deficit hyperactivity disorder, predominantly inattentive type: Secondary | ICD-10-CM | POA: Diagnosis not present

## 2019-10-16 DIAGNOSIS — F332 Major depressive disorder, recurrent severe without psychotic features: Secondary | ICD-10-CM | POA: Diagnosis not present

## 2019-10-26 DIAGNOSIS — F322 Major depressive disorder, single episode, severe without psychotic features: Secondary | ICD-10-CM | POA: Diagnosis not present

## 2019-10-29 DIAGNOSIS — M722 Plantar fascial fibromatosis: Secondary | ICD-10-CM | POA: Diagnosis not present

## 2019-11-05 DIAGNOSIS — F322 Major depressive disorder, single episode, severe without psychotic features: Secondary | ICD-10-CM | POA: Diagnosis not present

## 2019-11-12 DIAGNOSIS — F322 Major depressive disorder, single episode, severe without psychotic features: Secondary | ICD-10-CM | POA: Diagnosis not present

## 2019-11-18 DIAGNOSIS — F322 Major depressive disorder, single episode, severe without psychotic features: Secondary | ICD-10-CM | POA: Diagnosis not present

## 2019-11-24 DIAGNOSIS — F322 Major depressive disorder, single episode, severe without psychotic features: Secondary | ICD-10-CM | POA: Diagnosis not present

## 2019-12-01 DIAGNOSIS — F322 Major depressive disorder, single episode, severe without psychotic features: Secondary | ICD-10-CM | POA: Diagnosis not present

## 2019-12-08 DIAGNOSIS — F322 Major depressive disorder, single episode, severe without psychotic features: Secondary | ICD-10-CM | POA: Diagnosis not present

## 2019-12-14 DIAGNOSIS — F322 Major depressive disorder, single episode, severe without psychotic features: Secondary | ICD-10-CM | POA: Diagnosis not present

## 2019-12-16 ENCOUNTER — Other Ambulatory Visit: Payer: Self-pay | Admitting: Family Medicine

## 2019-12-16 DIAGNOSIS — R110 Nausea: Secondary | ICD-10-CM

## 2019-12-16 MED ORDER — ONDANSETRON 4 MG PO TBDP: 4.0000 mg | ORAL_TABLET | Freq: Three times a day (TID) | ORAL | 1 refills | Status: DC | PRN

## 2019-12-22 DIAGNOSIS — F322 Major depressive disorder, single episode, severe without psychotic features: Secondary | ICD-10-CM | POA: Diagnosis not present

## 2019-12-23 ENCOUNTER — Other Ambulatory Visit: Payer: Self-pay | Admitting: Psychiatry

## 2019-12-23 DIAGNOSIS — F332 Major depressive disorder, recurrent severe without psychotic features: Secondary | ICD-10-CM | POA: Diagnosis not present

## 2019-12-23 DIAGNOSIS — F410 Panic disorder [episodic paroxysmal anxiety] without agoraphobia: Secondary | ICD-10-CM | POA: Diagnosis not present

## 2019-12-23 DIAGNOSIS — F900 Attention-deficit hyperactivity disorder, predominantly inattentive type: Secondary | ICD-10-CM | POA: Diagnosis not present

## 2019-12-28 DIAGNOSIS — F322 Major depressive disorder, single episode, severe without psychotic features: Secondary | ICD-10-CM | POA: Diagnosis not present

## 2020-01-12 DIAGNOSIS — F322 Major depressive disorder, single episode, severe without psychotic features: Secondary | ICD-10-CM | POA: Diagnosis not present

## 2020-01-16 ENCOUNTER — Other Ambulatory Visit: Payer: Self-pay | Admitting: Psychiatry

## 2020-01-16 DIAGNOSIS — F900 Attention-deficit hyperactivity disorder, predominantly inattentive type: Secondary | ICD-10-CM | POA: Diagnosis not present

## 2020-01-16 DIAGNOSIS — F332 Major depressive disorder, recurrent severe without psychotic features: Secondary | ICD-10-CM | POA: Diagnosis not present

## 2020-01-16 DIAGNOSIS — F410 Panic disorder [episodic paroxysmal anxiety] without agoraphobia: Secondary | ICD-10-CM | POA: Diagnosis not present

## 2020-02-05 ENCOUNTER — Other Ambulatory Visit: Payer: Self-pay | Admitting: Neurology

## 2020-02-05 ENCOUNTER — Other Ambulatory Visit
Admission: RE | Admit: 2020-02-05 | Discharge: 2020-02-05 | Disposition: A | Discharge status: Home / Self Care | Payer: 59 | Attending: Neurology | Admitting: Neurology

## 2020-02-05 DIAGNOSIS — Z79899 Other long term (current) drug therapy: Secondary | ICD-10-CM | POA: Diagnosis not present

## 2020-02-05 DIAGNOSIS — E559 Vitamin D deficiency, unspecified: Secondary | ICD-10-CM | POA: Diagnosis not present

## 2020-02-05 DIAGNOSIS — E538 Deficiency of other specified B group vitamins: Secondary | ICD-10-CM | POA: Diagnosis not present

## 2020-02-05 LAB — CBC WITH DIFFERENTIAL/PLATELET
Abs Immature Granulocytes: 0.02 10*3/uL (ref 0.00–0.07)
Basophils Absolute: 0 10*3/uL (ref 0.0–0.1)
Basophils Relative: 1 %
Eosinophils Absolute: 0.1 10*3/uL (ref 0.0–0.5)
Eosinophils Relative: 1 %
HCT: 39 % (ref 36.0–46.0)
Hemoglobin: 13.3 g/dL (ref 12.0–15.0)
Immature Granulocytes: 0 %
Lymphocytes Relative: 43 %
Lymphs Abs: 2.8 10*3/uL (ref 0.7–4.0)
MCH: 32.4 pg (ref 26.0–34.0)
MCHC: 34.1 g/dL (ref 30.0–36.0)
MCV: 95.1 fL (ref 80.0–100.0)
Monocytes Absolute: 0.4 10*3/uL (ref 0.1–1.0)
Monocytes Relative: 6 %
Neutro Abs: 3.1 10*3/uL (ref 1.7–7.7)
Neutrophils Relative %: 49 %
Platelets: 360 10*3/uL (ref 150–400)
RBC: 4.1 MIL/uL (ref 3.87–5.11)
RDW: 11.7 % (ref 11.5–15.5)
WBC: 6.4 10*3/uL (ref 4.0–10.5)
nRBC: 0 % (ref 0.0–0.2)

## 2020-02-05 LAB — COMPREHENSIVE METABOLIC PANEL
ALT: 16 U/L (ref 0–44)
AST: 16 U/L (ref 15–41)
Albumin: 4.5 g/dL (ref 3.5–5.0)
Alkaline Phosphatase: 57 U/L (ref 38–126)
Anion gap: 10 (ref 5–15)
BUN: 8 mg/dL (ref 6–20)
CO2: 27 mmol/L (ref 22–32)
Calcium: 9.7 mg/dL (ref 8.9–10.3)
Chloride: 102 mmol/L (ref 98–111)
Creatinine, Ser: 0.64 mg/dL (ref 0.44–1.00)
GFR, Estimated: >60 mL/min (ref >60)
Glucose, Bld: 101 mg/dL — ABNORMAL HIGH (ref 70–99)
Potassium: 3.7 mmol/L (ref 3.5–5.1)
Sodium: 139 mmol/L (ref 135–145)
Total Bilirubin: 0.6 mg/dL (ref 0.3–1.2)
Total Protein: 7.3 g/dL (ref 6.5–8.1)

## 2020-02-05 LAB — LIPID PANEL
Cholesterol: 190 mg/dL (ref 0–200)
HDL: 77 mg/dL (ref >40)
LDL Cholesterol: 93 mg/dL (ref 0–99)
Total CHOL/HDL Ratio: 2.5 RATIO
Triglycerides: 100 mg/dL (ref <150)
VLDL: 20 mg/dL (ref 0–40)

## 2020-02-05 LAB — VITAMIN B12: Vitamin B-12: 326 pg/mL (ref 180–914)

## 2020-02-05 LAB — VITAMIN D 25 HYDROXY (VIT D DEFICIENCY, FRACTURES): Vit D, 25-Hydroxy: 48.48 ng/mL (ref 30–100)

## 2020-02-09 DIAGNOSIS — F322 Major depressive disorder, single episode, severe without psychotic features: Secondary | ICD-10-CM | POA: Diagnosis not present

## 2020-02-24 ENCOUNTER — Ambulatory Visit (INDEPENDENT_AMBULATORY_CARE_PROVIDER_SITE_OTHER): Payer: 59 | Admitting: Family Medicine

## 2020-02-24 ENCOUNTER — Encounter: Payer: Self-pay | Admitting: Family Medicine

## 2020-02-24 ENCOUNTER — Other Ambulatory Visit: Payer: Self-pay

## 2020-02-24 VITALS — BP 106/55 | HR 94 | Temp 98.6°F | Resp 18 | Ht 69.0 in | Wt 169.0 lb

## 2020-02-24 DIAGNOSIS — E559 Vitamin D deficiency, unspecified: Secondary | ICD-10-CM

## 2020-02-24 DIAGNOSIS — M47816 Spondylosis without myelopathy or radiculopathy, lumbar region: Secondary | ICD-10-CM

## 2020-02-24 DIAGNOSIS — Z0000 Encounter for general adult medical examination without abnormal findings: Secondary | ICD-10-CM

## 2020-02-24 DIAGNOSIS — E7800 Pure hypercholesterolemia, unspecified: Secondary | ICD-10-CM

## 2020-02-24 DIAGNOSIS — Z1211 Encounter for screening for malignant neoplasm of colon: Secondary | ICD-10-CM

## 2020-02-24 NOTE — Progress Notes (Signed)
Subjective:    Patient ID: Carolyn GullyAmy L [name], female    DOB: 11/06/1975, 45 y.o.   MRN: [id number]  Carolyn Gullymy L [name] is a 45 y.o. female presenting on 02/24/2020 for Annual Exam   HPI  Here for Annual Physical and Lab Review  Specialists Psychiatry - Caryn SectionAarti Kapur, MD Therapist - Karen [place] [facility]  Chronic Major Depression, Remission - Chronic problem followed by Psychiatry - last office visit note reviewed 06/2018, , per chart review has been on variety of trials of many medications for mood including Zoloft, Lexapro, Fluoxetine, Celexa, Duloxetine, Venlafaxine, lamictal, risperdal, lithium, latuda Interval update prior stressor in 2021 with separation now she is in a good place and doing well - Currently taking Zoloft 200mg  daily, Effexor 150mg  daily, Trazodone 100mg  x2 nightly, Lamictal - also infrequent clonazepam 0.5mg  maybe once weekly  Hyperlipidemia Weight BMI >24 Down 25-30 lbs Notable improvement on lipid panel on last lab with some weight loss Not on statin, not interested  Essential Tremor, Chronic Followed by Dr Sherryll [facility]Shah [facility]), due in Fall/Winter 2020 - Chronic problem with tremors in bilateral hands, legs >6 years, [facility], continues ontaking Primidone and Propranolol  Elevated A1c - RESOLVED  Vitamin D Insufficiency - Prior history of deficiency Last lab normal Vit D 48 Continue taking Vitamin D3 2k daily  Health Maintenance:  Cervical Cancer Screening -[facility] last 10/2018  Osteopenia > DEXAto be done at GYN as well  Last 12/2018 Mammogram from OB GYN. Fam history of breast cancer see below, she will return for repeat mammo from them.  Colon CA Screening: Never had colonoscopy. Currently asymptomatic. No known family history of colon CA. Due for screening test  New order Cologuard, counseling given  Will obtain Hep C screening next year.  [facility]/24/2020  07/29/2018 10/28/2017  Decreased Interest 0 0 0  Down, Depressed, Hopeless 0 0 0  PHQ - 2 Score 0 0 0  Altered sleeping 0 1 0  Tired, decreased energy 0 1 0  Change in appetite 0 0 0  Feeling bad or failure about yourself  0 0 0  Trouble concentrating 0 1 0  Moving slowly or fidgety/restless 0 0 0  Suicidal thoughts 0 0 0  PHQ-9 Score 0 3 0  Difficult doing work/chores Not difficult at all Not difficult at all Not difficult at all    GAD 7 : Generalized Anxiety Score 02/24/2020 07/29/2018 07/02/2017  Nervous, Anxious, on Edge 0 0 0  Control/stop worrying 0 0 0  Worry too much - different things 0 0 0  Trouble relaxing 0 1 1  Restless 0 0 0  Easily annoyed or irritable 0 0 0  Afraid - awful might happen 0 0 0  Total GAD 7 Score 0 1 1  Anxiety Difficulty Not difficult at all Not difficult at all Not difficult at all     Past Medical History:  Diagnosis Date  . Anemia    a. low-normal H/H  . Anxiety   . Arthritis    osteopenia  . Benign essential tremor    a. bilateral upper and lower extremities; [facility]; c. felt to be 2/2 antidepressants   . Carpal tunnel syndrome   . Deafness in left ear   . Depression   . Mixed incontinence   . Palpitations    Past Surgical History:  Procedure Laterality Date  . axillary lymph node removal Right 1982  enlarged lymph node  . IUD REMOVAL N/A 04/23/2014   Procedure: INTRAUTERINE DEVICE (IUD) REMOVAL;  Surgeon: Harold Hedge, MD;  Location: [facility];  Service: Gynecology;  Laterality: N/A;  . LAPAROSCOPIC TUBAL LIGATION Bilateral 04/23/2014   Procedure: LAPAROSCOPIC TUBAL LIGATION with Filshie Clips;  Surgeon: Harold Hedge, MD;  Location: [facility];  Service: Gynecology;  Laterality: Bilateral;   Social History   Socioeconomic History  . Marital status: Married    Spouse name: Park Breed  . Number of children: 1  . Years of education: Not on file  . Highest education level: Not on file  Occupational History  . 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 Smoking status: Never Smoker  . Smokeless tobacco: Never Used  Vaping Use  . Vaping Use: Never used  Substance and Sexual Activity  . Alcohol use: Yes    Alcohol/week: 3.0 standard drinks    Types: 3 Cans of beer per week  . Drug use: No  . Sexual activity: Yes    Birth control/protection: Surgical  Other Topics Concern  . Not on file  Social History Narrative  . Not on file   Social Determinants of Health   Financial Resource Strain: Not on file  Food Insecurity: Not on file  Transportation Needs: Not on file  Physical Activity: Not on file  Stress: Not on file  Social Connections: Not on file  Intimate Partner Violence: Not on file   Family History  Problem Relation Age of Onset  . Depression Mother        bipolar disorder  . Arthritis Mother        osteoprosis  . Heart disease Father   . Heart attack Father 56  . Heart disease Paternal Grandmother   . Cancer Paternal Grandmother        breast cancer  . Colon cancer Neg Hx   . Bladder Cancer Neg Hx   . Kidney cancer Neg Hx    Current Outpatient Medications on File Prior to Visit  Medication Sig  . Brexpiprazole (REXULTI) 3 MG TABS Take 1 tablet by mouth daily.  . Cholecalciferol (VITAMIN D3) 2000 units TABS Take 2,000 Units by mouth daily.  . clonazePAM (KLONOPIN) 0.5 MG tablet Take 0.5 mg by mouth 2 (two) times daily as needed for anxiety.  . lamoTRIgine (LAMICTAL) 200 MG tablet Take 200 mg by mouth as directed. 200MG  morning, 50MG  at bedtime  . Multiple Vitamins-Minerals (MULTIVITAMIN PO) Take 1 tablet by mouth daily.  . primidone (MYSOLINE) 50 MG tablet Take 50 mg by mouth 2 (two) times daily.  . propranolol (INDERAL) 10 MG tablet Take 20 mg by mouth 2 (two) times daily.  . sertraline (ZOLOFT) 100 MG tablet Take 200 mg by mouth daily.  . traZODone (DESYREL) 100 MG tablet Take 100 mg by mouth at bedtime.  venlafaxine XR (EFFEXOR-XR) 150 MG 24 hr  capsule Take 150 mg by mouth daily with breakfast.   No current facility-administered medications on file prior to visit.    Review of Systems  Constitutional: Negative for activity change, appetite change, chills, diaphoresis, fatigue and fever.  HENT: Negative for congestion and hearing loss.   Eyes: Negative for visual disturbance.  Respiratory: Negative for cough, chest tightness, shortness of breath and wheezing.   Cardiovascular: Negative for chest pain, palpitations and leg swelling.  Gastrointestinal: Negative for abdominal pain, constipation, diarrhea, nausea and vomiting.  Genitourinary: Negative for dysuria, frequency and hematuria.  Musculoskeletal: Negative for arthralgias  and neck pain.  Skin: Negative for rash.  Neurological: Negative for dizziness, weakness, light-headedness, numbness and headaches.  Hematological: Negative for adenopathy.  Psychiatric/Behavioral: Negative for behavioral problems, dysphoric mood and sleep disturbance.   Per HPI unless specifically indicated above     Objective:    BP (!) 106/55 (BP Location: Left Arm, Patient Position: Sitting, Cuff Size: Normal)   Pulse 94   Temp 98.6 F (37 C) (Temporal)   Resp 18   Ht 5\' 9"  (1.753 m)   Wt 169 lb (76.7 kg)   SpO2 99%   BMI 24.96 kg/m   Wt Readings from Last 3 Encounters:  02/24/20 169 lb (76.7 kg)  02/23/19 194 lb (88 kg)  02/22/19 198 lb (89.8 kg)    Physical Exam Vitals and nursing note reviewed.  Constitutional:      General: She is not in acute distress.    Appearance: She is well-developed and well-nourished. She is not diaphoretic.     Comments: Well-appearing, comfortable, cooperative  HENT:     Head: Normocephalic and atraumatic.     Mouth/Throat:     Mouth: Oropharynx is clear and moist.  Eyes:     General:        Right eye: No discharge.        Left eye: No discharge.     Extraocular Movements: EOM normal.     Conjunctiva/sclera: Conjunctivae normal.     Pupils:  Pupils are equal, round, and reactive to light.  Neck:     Thyroid: No thyromegaly.  Cardiovascular:     Rate and Rhythm: Normal rate and regular rhythm.     Pulses: Intact distal pulses.     Heart sounds: Normal heart sounds. No murmur heard.   Pulmonary:     Effort: Pulmonary effort is normal. No respiratory distress.     Breath sounds: Normal breath sounds. No wheezing or rales.  Abdominal:     General: Bowel sounds are normal. There is no distension.     Palpations: Abdomen is soft. There is no mass.     Tenderness: There is no abdominal tenderness.  Musculoskeletal:        General: No tenderness or edema. Normal range of motion.     Cervical back: Normal range of motion and neck supple.     Comments: Upper / Lower Extremities: - Normal muscle tone, strength bilateral upper extremities 5/5, lower extremities 5/5  Lymphadenopathy:     Cervical: No cervical adenopathy.  Skin:    General: Skin is warm and dry.     Findings: No erythema or rash.  Neurological:     Mental Status: She is alert and oriented to person, place, and time.     Comments: Distal sensation intact to light touch all extremities  Psychiatric:        Mood and Affect: Mood and affect normal.        Behavior: Behavior normal.     Comments: Well groomed, good eye contact, normal speech and thoughts        Results for orders placed or performed during the hospital encounter of 02/05/20  CBC with Differential/Platelet  Result Value Ref Range   WBC 6.4 4.0 - 10.5 K/uL   RBC 4.10 3.87 - 5.11 MIL/uL   Hemoglobin 13.3 12.0 - 15.0 g/dL   HCT 02/07/20 06.2 - 37.6 %   MCV 95.1 80.0 - 100.0 fL   MCH 32.4 26.0 - 34.0 pg   MCHC 34.1 30.0 - 36.0  g/dL   RDW 32.9 92.4 - 26.8 %   Platelets 360 150 - 400 K/uL   nRBC 0.0 0.0 - 0.2 %   Neutrophils Relative % 49 %   Neutro Abs 3.1 1.7 - 7.7 K/uL   Lymphocytes Relative 43 %   Lymphs Abs 2.8 0.7 - 4.0 K/uL   Monocytes Relative 6 %   Monocytes Absolute 0.4 0.1 - 1.0 K/uL    Eosinophils Relative 1 %   Eosinophils Absolute 0.1 0.0 - 0.5 K/uL   Basophils Relative 1 %   Basophils Absolute 0.0 0.0 - 0.1 K/uL   Immature Granulocytes 0 %   Abs Immature Granulocytes 0.02 0.00 - 0.07 K/uL  Comprehensive metabolic panel  Result Value Ref Range   Sodium 139 135 - 145 mmol/L   Potassium 3.7 3.5 - 5.1 mmol/L   Chloride 102 98 - 111 mmol/L   CO2 27 22 - 32 mmol/L   Glucose, Bld 101 (H) 70 - 99 mg/dL   BUN 8 6 - 20 mg/dL   Creatinine, Ser 3.41 0.44 - 1.00 mg/dL   Calcium 9.7 8.9 - 96.2 mg/dL   Total Protein 7.3 6.5 - 8.1 g/dL   Albumin 4.5 3.5 - 5.0 g/dL   AST 16 15 - 41 U/L   ALT 16 0 - 44 U/L   Alkaline Phosphatase 57 38 - 126 U/L   Total Bilirubin 0.6 0.3 - 1.2 mg/dL   GFR, Estimated >22 >97 mL/min   Anion gap 10 5 - 15  Lipid panel  Result Value Ref Range   Cholesterol 190 0 - 200 mg/dL   Triglycerides 989 <211 mg/dL   HDL 77 >94 mg/dL   Total CHOL/HDL Ratio 2.5 RATIO   VLDL 20 0 - 40 mg/dL   LDL Cholesterol 93 0 - 99 mg/dL  Vitamin R74  Result Value Ref Range   Vitamin B-12 326 180 - 914 pg/mL  VITAMIN D 25 Hydroxy (Vit-D Deficiency, Fractures)  Result Value Ref Range   Vit D, 25-Hydroxy 48.48 30 - 100 ng/mL      Assessment & Plan:   Problem List Items Addressed This Visit    Vitamin D insufficiency   Hyperlipidemia   Degenerative joint disease (DJD) of lumbar spine    Other Visit Diagnoses    Annual physical exam    -  Primary   Colon cancer screening       Relevant Orders   Cologuard     Updated Health Maintenance information  Due for routine colon cancer screening. Never had colonoscopy (not interested), no family history colon cancer. - Discussion today about recommendations for either Colonoscopy or Cologuard screening, benefits and risks of screening, interested in Cologuard, understands that if positive then recommendation is for diagnostic colonoscopy to follow-up. - Ordered Cologuard today  Future order Hep C  screening  Reviewed recent lab results with patient Lipid panel is significantly improved. Not on medication. Likely w/ weight loss lifestyle Encouraged improvement to lifestyle with diet and exercise Maintain healthy weight   Continue mental health management w/ Dr Maryruth Bun and CBT therapy counseling  No orders of the defined types were placed in this encounter.     Follow up plan: Return in about 1 year (around 02/23/2021) for 1 year fasting lab only then 1 week later Annual Physical.  Future labs add Hep C in addition to routine labs.  Saralyn Pilar, DO [facility] 02/24/2020, 2:42 PM

## 2020-02-24 NOTE — Patient Instructions (Addendum)
Thank you for coming to the office today.   Colon Cancer Screening: - For all adults age 47+ routine colon cancer screening is highly recommended.     - Recent guidelines from [place] recommend starting age of 73 - Early detection of colon cancer is important, because often there are no warning signs or symptoms, also if found early usually it can be cured. Late stage is hard to treat.  - If you are not interested in Colonoscopy screening (if done and normal you could be cleared for 5 to 10 years until next due), then Cologuard is an excellent alternative for screening test for Colon Cancer. It is highly sensitive for detecting DNA of colon cancer from even the earliest stages. Also, there is NO bowel prep required. - If Cologuard is NEGATIVE, then it is good for 3 years before next due - If Cologuard is POSITIVE, then it is strongly advised to get a Colonoscopy, which allows the GI doctor to locate the source of the cancer or polyp (even very early stage) and treat it by removing it. ------------------------- If you would like to proceed with Cologuard (stool DNA test) - FIRST, call your insurance company and tell them you want to check cost of Cologuard tell them CPT Code [phone] (it may be completely covered and you could get for no cost, OR max cost without any coverage is about $600). Also, keep in mind if you do NOT open the kit, and decide not to do the test, you will NOT be charged, you should contact the company if you decide not to do the test. - If you want to proceed, you can notify us (phone message, [place], or at next visit) and we will order it for you. The test kit will be delivered to you house within about 1 week. Follow instructions to collect sample, you may call the company for any help or questions, 24/7 telephone support at [phone].  ALREADY ORDERED.   DUE for FASTING BLOOD WORK (no food or drink after midnight before the lab appointment, only  water or coffee without cream/sugar on the morning of)  SCHEDULE "Lab Only" visit in the morning at the clinic for lab draw in 1 YEAR  - Make sure Lab Only appointment is at about 1 week before your next appointment, so that results will be available  For Lab Results, once available within 2-3 days of blood draw, you can can log in to MyChart online to view your results and a brief explanation. Also, we can discuss results at next follow-up visit.   Please schedule a Follow-up Appointment to: Return in about 1 year (around 02/23/2021) for 1 year fasting lab only then 1 week later Annual Physical.  If you have any other questions or concerns, please feel free to call the office or send a message through [place]. You may also schedule an earlier appointment if necessary.  Additionally, you may be receiving a survey about your experience at our office within a few days to 1 week by e-mail or mail. We value your feedback.  Nobie Putnam, DO [place]

## 2020-03-08 DIAGNOSIS — F322 Major depressive disorder, single episode, severe without psychotic features: Secondary | ICD-10-CM | POA: Diagnosis not present

## 2020-03-17 ENCOUNTER — Other Ambulatory Visit (HOSPITAL_COMMUNITY): Payer: Self-pay | Admitting: Psychiatry

## 2020-03-17 DIAGNOSIS — F332 Major depressive disorder, recurrent severe without psychotic features: Secondary | ICD-10-CM | POA: Diagnosis not present

## 2020-03-17 DIAGNOSIS — F410 Panic disorder [episodic paroxysmal anxiety] without agoraphobia: Secondary | ICD-10-CM | POA: Diagnosis not present

## 2020-03-17 DIAGNOSIS — F900 Attention-deficit hyperactivity disorder, predominantly inattentive type: Secondary | ICD-10-CM | POA: Diagnosis not present

## 2020-03-17 MED FILL — SERTRALINE HCL 100 MG TABS: 100 | 90 days supply | Qty: 180 | Fill #0

## 2020-03-17 MED FILL — traZODone HCL 100 MG TABS: 100 | 90 days supply | Qty: 180 | Fill #0

## 2020-03-29 ENCOUNTER — Other Ambulatory Visit: Payer: Self-pay | Admitting: Urology

## 2020-03-29 ENCOUNTER — Other Ambulatory Visit: Payer: Self-pay

## 2020-03-29 ENCOUNTER — Other Ambulatory Visit: Payer: 59

## 2020-03-29 DIAGNOSIS — R350 Frequency of micturition: Secondary | ICD-10-CM | POA: Diagnosis not present

## 2020-03-29 DIAGNOSIS — N3000 Acute cystitis without hematuria: Secondary | ICD-10-CM | POA: Diagnosis not present

## 2020-03-29 MED ORDER — SULFAMETHOXAZOLE-TRIMETHOPRIM 800-160 MG PO TABS: 1.0000 | ORAL_TABLET | Freq: Two times a day (BID) | ORAL | 0 refills | Status: DC

## 2020-03-29 NOTE — Progress Notes (Signed)
Patient contacted the office stating she had symptoms of urinary tract infection and would like to bring a UA urine for urinalysis and culture this afternoon.

## 2020-03-29 NOTE — Progress Notes (Signed)
Error

## 2020-03-29 NOTE — Progress Notes (Signed)
Septra DS sent to pharmacy

## 2020-03-30 LAB — URINALYSIS, COMPLETE
Bilirubin, UA: NEGATIVE
Glucose, UA: NEGATIVE
Nitrite, UA: NEGATIVE
Specific Gravity, UA: 1.02 (ref 1.005–1.030)
Urobilinogen, Ur: 0.2 mg/dL (ref 0.2–1.0)
pH, UA: 7 (ref 5.0–7.5)

## 2020-03-30 LAB — MICROSCOPIC EXAMINATION
Epithelial Cells (non renal): >10 /hpf — AB (ref 0–10)
WBC, UA: >30 /hpf — AB (ref 0–5)

## 2020-04-01 LAB — CULTURE, URINE COMPREHENSIVE

## 2020-04-11 ENCOUNTER — Other Ambulatory Visit (HOSPITAL_COMMUNITY): Payer: Self-pay

## 2020-04-11 MED FILL — Venlafaxine HCl Cap ER 24HR 150 MG (Base Equivalent): ORAL | 90 days supply | Qty: 90 | Fill #0 | Status: AC

## 2020-04-11 MED FILL — Brexpiprazole Tab 3 MG: ORAL | 30 days supply | Qty: 30 | Fill #0 | Status: AC

## 2020-04-12 ENCOUNTER — Other Ambulatory Visit (HOSPITAL_COMMUNITY): Payer: Self-pay

## 2020-04-15 ENCOUNTER — Ambulatory Visit (INDEPENDENT_AMBULATORY_CARE_PROVIDER_SITE_OTHER): Payer: 59 | Admitting: Advanced Practice Midwife

## 2020-04-15 ENCOUNTER — Other Ambulatory Visit (HOSPITAL_COMMUNITY): Payer: Self-pay

## 2020-04-15 ENCOUNTER — Other Ambulatory Visit (HOSPITAL_COMMUNITY)
Admission: RE | Admit: 2020-04-15 | Discharge: 2020-04-15 | Disposition: A | Discharge status: Home / Self Care | Payer: 59 | Source: Ambulatory Visit | Attending: Advanced Practice Midwife | Admitting: Advanced Practice Midwife

## 2020-04-15 ENCOUNTER — Other Ambulatory Visit: Payer: Self-pay

## 2020-04-15 ENCOUNTER — Encounter: Payer: Self-pay | Admitting: Advanced Practice Midwife

## 2020-04-15 VITALS — BP 100/60 | Ht 69.0 in | Wt 163.0 lb

## 2020-04-15 DIAGNOSIS — Z01419 Encounter for gynecological examination (general) (routine) without abnormal findings: Secondary | ICD-10-CM | POA: Diagnosis not present

## 2020-04-15 DIAGNOSIS — Z1159 Encounter for screening for other viral diseases: Secondary | ICD-10-CM

## 2020-04-15 DIAGNOSIS — N951 Menopausal and female climacteric states: Secondary | ICD-10-CM | POA: Diagnosis not present

## 2020-04-15 DIAGNOSIS — Z113 Encounter for screening for infections with a predominantly sexual mode of transmission: Secondary | ICD-10-CM | POA: Insufficient documentation

## 2020-04-15 DIAGNOSIS — Z1239 Encounter for other screening for malignant neoplasm of breast: Secondary | ICD-10-CM

## 2020-04-15 MED ORDER — ESTROGENS CONJUGATED 0.625 MG PO TABS
0.6250 mg | ORAL_TABLET | Freq: Every day | ORAL | 11 refills | Status: AC
  Filled 2020-04-15: qty 30, 30d supply, fill #0
  Filled 2020-06-01: qty 30, 30d supply, fill #1
  Filled 2020-07-04: qty 30, 30d supply, fill #2
  Filled 2020-07-31: qty 30, 30d supply, fill #3
  Filled 2020-08-30: qty 30, 30d supply, fill #4
  Filled 2020-09-25: qty 30, 30d supply, fill #5
  Filled 2020-10-29: qty 30, 30d supply, fill #6
  Filled 2020-11-27: qty 30, 30d supply, fill #7
  Filled 2020-12-22: qty 30, 30d supply, fill #8
  Filled 2021-01-28: qty 30, 30d supply, fill #9
  Filled 2021-02-26: qty 30, 30d supply, fill #10
  Filled 2021-03-28: qty 30, 30d supply, fill #11

## 2020-04-15 NOTE — Progress Notes (Signed)
Gynecology Annual Exam  PCP: Smitty Cords, DO  Chief Complaint:  Chief Complaint  Patient presents with  . Annual Exam  . Hot Flashes  . Night Sweats    History of Present Illness: Patient is a 45 y.o. [name] presents for annual exam. The patient has primary complaint of hot flashes day and night with nightly sweating that is sleep interrupting. She requests Rx estrogen pills that she has taken in the past with success. She did separate from her husband and no longer is depressed. She has a new partner and requests STD testing.    LMP: October 2021. Patient is perimenopausal. Average Interval: irregular approximately every 6 months Duration of flow: 2 days Heavy Menses: no Clots: no Intermenstrual Bleeding: no Postcoital Bleeding: no Dysmenorrhea: no   The patient is sexually active. She currently uses tubal ligation for contraception. She denies dyspareunia.  The patient does perform self breast exams.  There is no notable family history of breast or ovarian cancer in her family.  The patient wears seatbelts: yes.   The patient has regular exercise: she walks regularly. she admits diet could be improved- weakness is carbs, she admits adequate hydration and she gets about 6-7 hours of sleep.    The patient denies current symptoms of depression.    Review of Systems: Review of Systems  Constitutional: Negative for chills and fever.  HENT: Negative for congestion, ear discharge, ear pain, hearing loss, sinus pain and sore throat.   Eyes: Negative for blurred vision and double vision.  Respiratory: Negative for cough, shortness of breath and wheezing.   Cardiovascular: Negative for chest pain, palpitations and leg swelling.  Gastrointestinal: Positive for constipation. Negative for abdominal pain, blood in stool, diarrhea, heartburn, melena, nausea and vomiting.  Genitourinary: Negative for dysuria, flank pain, frequency, hematuria and urgency.  Musculoskeletal:  Negative for back pain, joint pain and myalgias.  Skin: Negative for itching and rash.  Neurological: Negative for dizziness, tingling, tremors, sensory change, speech change, focal weakness, seizures, loss of consciousness, weakness and headaches.  Endo/Heme/Allergies: Positive for environmental allergies. Bruises/bleeds easily.       Positive for hot flashes  Psychiatric/Behavioral: Negative for depression, hallucinations, memory loss, substance abuse and suicidal ideas. The patient is not nervous/anxious and does not have insomnia.     Past Medical History:  Patient Active Problem List   Diagnosis Date Noted  . Hyperlipidemia 05/17/2017  . 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 Osteopenia 11/29/2015  . Vitamin D insufficiency 11/29/2015  . Deafness in left ear   . Anxiety 04/08/2013  . Benign essential tremor 04/08/2013    Past Surgical History:  Past Surgical History:  Procedure Laterality Date  . axillary lymph node removal Right 1982   enlarged lymph node  . IUD REMOVAL N/A 04/23/2014   Procedure: INTRAUTERINE DEVICE (IUD) REMOVAL;  Surgeon: Harold Hedge, MD;  Location: [facility];  Service: Gynecology;  Laterality: N/A;  . LAPAROSCOPIC TUBAL LIGATION Bilateral 04/23/2014   Procedure: LAPAROSCOPIC TUBAL LIGATION with Filshie Clips;  Surgeon: Harold Hedge, MD;  Location: [facility];  Service: Gynecology;  Laterality: Bilateral;    Gynecologic History:  No LMP recorded. Patient is perimenopausal. Contraception: tubal ligation Last Pap: 2020 Results were:  no abnormalities  Last mammogram: 2020 Results were: BI-RAD I  Obstetric History: [name]  Family History:  Family History  Problem Relation Age of Onset  . Depression Mother        bipolar disorder  .  Arthritis Mother        osteoprosis  . Heart disease Father   . Heart attack Father 2  . Heart disease Paternal Grandmother   . Cancer Paternal Grandmother        breast cancer  . Colon cancer  Neg Hx   . Bladder Cancer Neg Hx   . Kidney cancer Neg Hx     Social History:  Social History   Socioeconomic History  . Marital status: Married    Spouse name: Park Breed  . Number of children: 1  . Years of education: Not on file  . Highest education level: Not on file  Occupational History  . 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 Smoking status: Never Smoker  . Smokeless tobacco: Never Used  Vaping Use  . Vaping Use: Never used  Substance and Sexual Activity  . Alcohol use: Yes    Alcohol/week: 3.0 standard drinks    Types: 3 Cans of beer per week  . Drug use: No  . Sexual activity: Yes    Birth control/protection: Surgical  Other Topics Concern  . Not on file  Social History Narrative  . Not on file   Social Determinants of Health   Financial Resource Strain: Not on file  Food Insecurity: Not on file  Transportation Needs: Not on file  Physical Activity: Not on file  Stress: Not on file  Social Connections: Not on file  Intimate Partner Violence: Not on file    Allergies:  Allergies  Allergen Reactions  . Latex Itching and Rash    sensitivity    Medications: Prior to Admission medications   Medication Sig Start Date End Date Taking? Authorizing Provider  Brexpiprazole (REXULTI) 3 MG TABS Take 1 tablet by mouth daily.   Yes [provider]  Cholecalciferol (VITAMIN D3) 2000 units TABS Take 2,000 Units by mouth daily.   Yes [provider]  clonazePAM (KLONOPIN) 0.5 MG tablet Take 0.5 mg by mouth 2 (two) times daily as needed for anxiety.   Yes [provider]  estrogens, conjugated, (PREMARIN) 0.625 MG tablet Take 1 tablet (0.625 mg total) by mouth daily. 04/15/20  Yes Tresea Mall, CNM  lamoTRIgine (LAMICTAL) 200 MG tablet Take 200 mg by mouth as directed. 200MG  morning, 50MG  at bedtime 07/02/17  Yes [provider]  lamoTRIgine (LAMICTAL) 25 MG tablet TAKE 2 TABLET BY MOUTH DAILY AT NIGHT  01/16/20 01/15/21 Yes 03/15/20, MD  Multiple Vitamins-Minerals (MULTIVITAMIN PO) Take 1 tablet by mouth daily.   Yes [provider]  primidone (MYSOLINE) 50 MG tablet Take 50 mg by mouth 2 (two) times daily.   Yes [provider]  propranolol (INDERAL) 20 MG tablet TAKE 1 TABLET BY MOUTH 2 TIMES DAILY 02/05/20 02/04/21 Yes Konz, Autumn Kari, PA-C  sertraline (ZOLOFT) 100 MG tablet Take 200 mg by mouth daily.   Yes [provider]  traZODone (DESYREL) 100 MG tablet Take 100 mg by mouth at bedtime. 04/12/17  Yes [provider]  venlafaxine XR (EFFEXOR-XR) 150 MG 24 hr capsule Take 150 mg by mouth daily with breakfast. 07/22/17  Yes [provider]  Brexpiprazole 3 MG TABS TAKE 1 TABLET BY MOUTH ONCE DAILY 10/16/19 10/15/20  12/16/19, MD  lamoTRIgine (LAMICTAL) 200 MG tablet TAKE 1 TABLET BY MOUTH ONCE DAILY 07/20/19 07/19/20  09/20/19, MD  primidone (MYSOLINE) 50 MG tablet TAKE 1 TABLET BY MOUTH 3 TIMES DAILY 07/30/19 07/29/20  08/01/19,  Heath Lark, PA-C    Physical Exam Vitals: Blood pressure 100/60, height 5\' 9"  (1.753 m), weight 163 lb (73.9 kg).  General: NAD HEENT: normocephalic, anicteric Thyroid: no enlargement, no palpable nodules Pulmonary: No increased work of breathing, CTAB Cardiovascular: RRR, distal pulses 2+ Breast: Breast symmetrical, no tenderness, no palpable nodules or masses, no skin or nipple retraction present, no nipple discharge.  No axillary or supraclavicular lymphadenopathy. Abdomen: NABS, soft, non-tender, non-distended.  Umbilicus without lesions.  No hepatomegaly, splenomegaly or masses palpable. No evidence of hernia  Genitourinary:  External: Normal external female genitalia.  Normal urethral meatus, normal Bartholin's and Skene's glands.    Vagina: Normal vaginal mucosa, no evidence of prolapse.    Cervix: no CMT  Uterus: deferred    Adnexa: deferred  Rectal: deferred  Lymphatic: no evidence of inguinal  lymphadenopathy Extremities: no edema, erythema, or tenderness Neurologic: Grossly intact Psychiatric: mood appropriate, affect full   Assessment: Carolyn [name] routine annual exam  Plan: Problem List Items Addressed This Visit   None   Visit Diagnoses    Well woman exam with routine gynecological exam    -  Primary   Relevant Medications   estrogens, conjugated, (PREMARIN) 0.625 MG tablet   Other Relevant Orders   MM DIGITAL SCREENING BILATERAL   Cervicovaginal ancillary only   Hepatitis panel, acute   RPR Qual   HIV Antibody (routine testing w rflx)   Hot flash, menopausal       Relevant Medications   estrogens, conjugated, (PREMARIN) 0.625 MG tablet   Screen for sexually transmitted diseases       Relevant Orders   Cervicovaginal ancillary only   Hepatitis panel, acute   RPR Qual   HIV Antibody (routine testing w rflx)   Breast screening       Relevant Orders   MM DIGITAL SCREENING BILATERAL   Need for hepatitis B screening test       Relevant Orders   Hepatitis panel, acute   Need for hepatitis C screening test       Relevant Orders   Hepatitis panel, acute      1) Mammogram - recommend yearly screening mammogram.  Mammogram Was ordered today  2) STI screening  was offered and accepted  3) ASCCP guidelines and rationale discussed.  Patient opts for every 3 years screening interval  4) Contraception - the patient is currently using  tubal ligation.  She is happy with her current form of contraception and plans to continue  5) Colonoscopy: she has cologard per PCP -- Screening recommended starting at age 18 for average risk individuals, age 10 for individuals deemed at increased risk (including African Americans) and recommended to continue until age 13.  For patient age Carolyn-83 individualized approach is recommended.  Gold standard screening is via colonoscopy, Cologuard screening is an acceptable alternative for patient unwilling or unable to undergo  colonoscopy.  "Colorectal cancer screening for average?risk adults: 2018 guideline update from the American Cancer Society"CA: A Cancer Journal for Clinicians: Jun 06, 2016   6) Routine healthcare maintenance including cholesterol, diabetes screening discussed managed by PCP  7) Return in about 1 year (around 04/15/2021) for annual established gyn.   06/15/2021, CNM [facility] 04/15/2020, 4:49 PM

## 2020-04-16 ENCOUNTER — Other Ambulatory Visit (HOSPITAL_COMMUNITY): Payer: Self-pay

## 2020-04-16 LAB — RPR QUALITATIVE: RPR Ser Ql: NONREACTIVE

## 2020-04-16 LAB — HEPATITIS PANEL, ACUTE
Hep A IgM: NEGATIVE
Hep B C IgM: NEGATIVE
Hep C Virus Ab: <0.1 s/co ratio (ref 0.0–0.9)
Hepatitis B Surface Ag: NEGATIVE

## 2020-04-16 LAB — HIV ANTIBODY (ROUTINE TESTING W REFLEX): HIV Screen 4th Generation wRfx: NONREACTIVE

## 2020-04-19 LAB — CERVICOVAGINAL ANCILLARY ONLY
Chlamydia: NEGATIVE
Comment: NEGATIVE
Comment: NEGATIVE
Comment: NORMAL
Neisseria Gonorrhea: NEGATIVE
Trichomonas: NEGATIVE

## 2020-05-10 NOTE — Telephone Encounter (Signed)
Please advise 

## 2020-05-11 ENCOUNTER — Other Ambulatory Visit: Payer: Self-pay | Admitting: Advanced Practice Midwife

## 2020-05-11 ENCOUNTER — Other Ambulatory Visit (HOSPITAL_COMMUNITY): Payer: Self-pay

## 2020-05-11 ENCOUNTER — Other Ambulatory Visit: Payer: Self-pay

## 2020-05-11 DIAGNOSIS — N951 Menopausal and female climacteric states: Secondary | ICD-10-CM

## 2020-05-11 DIAGNOSIS — R232 Flushing: Secondary | ICD-10-CM

## 2020-05-11 MED ORDER — ESTROGENS CONJUGATED 0.45 MG PO TABS
0.4500 mg | ORAL_TABLET | Freq: Every day | ORAL | 11 refills | Status: DC
  Filled 2020-05-11: qty 30, 30d supply, fill #0

## 2020-05-11 MED ORDER — ESTROGENS CONJUGATED 0.45 MG PO TABS
0.4500 mg | ORAL_TABLET | Freq: Every day | ORAL | 11 refills | Status: DC
  Filled 2020-05-11: qty 21, 28d supply, fill #0

## 2020-05-11 NOTE — Progress Notes (Signed)
Rx premarin resent to correct pharmacy.

## 2020-05-11 NOTE — Progress Notes (Signed)
Premarin Rx changed to generic per patient insurance coverage

## 2020-05-12 ENCOUNTER — Other Ambulatory Visit: Payer: Self-pay | Admitting: Psychiatry

## 2020-05-12 ENCOUNTER — Other Ambulatory Visit (HOSPITAL_COMMUNITY): Payer: Self-pay

## 2020-05-15 ENCOUNTER — Ambulatory Visit
Admission: EM | Admit: 2020-05-15 | Discharge: 2020-05-15 | Disposition: A | Discharge status: Home / Self Care | Payer: 59 | Attending: Emergency Medicine | Admitting: Emergency Medicine

## 2020-05-15 ENCOUNTER — Other Ambulatory Visit: Payer: Self-pay

## 2020-05-15 DIAGNOSIS — N390 Urinary tract infection, site not specified: Secondary | ICD-10-CM | POA: Insufficient documentation

## 2020-05-15 LAB — URINALYSIS, COMPLETE (UACMP) WITH MICROSCOPIC
Bilirubin Urine: NEGATIVE
Glucose, UA: NEGATIVE mg/dL
Ketones, ur: NEGATIVE mg/dL
Nitrite: NEGATIVE
Protein, ur: NEGATIVE mg/dL
Specific Gravity, Urine: <1.005 — ABNORMAL LOW (ref 1.005–1.030)
WBC, UA: >50 WBC/hpf (ref 0–5)
pH: 6.5 (ref 5.0–8.0)

## 2020-05-15 MED ORDER — NITROFURANTOIN MONOHYD MACRO 100 MG PO CAPS: 100.0000 mg | ORAL_CAPSULE | Freq: Two times a day (BID) | ORAL | 0 refills | Status: DC

## 2020-05-15 NOTE — ED Provider Notes (Signed)
[facility]    CSN: [id number] Arrival date & time: 05/15/20  1513      History   Chief Complaint Chief Complaint  Patient presents with  . Dysuria    HPI Carolyn [name] is a 45 y.o. female.   HPI  Dysuria: Pt reports dysuria for the past 3-4 days. She also has had some nausea today without vomiting.  Symptoms feel similar to past UTIs.  She has had a UTI about 1 month ago which according to patient grew out group B strep and she was treated with Bactrim successfully.  She has not tried anything for current symptoms.  No fevers, vomiting, abdominal or pelvic pain.   Past Medical History:  Diagnosis Date  . Anemia    a. low-normal H/H  . Anxiety   . Arthritis    osteopenia  . Benign essential tremor    a. bilateral upper and lower extremities; b. followed by [facility]; c. felt to be 2/2 antidepressants   . Carpal tunnel syndrome   . Deafness in left ear   . Depression   . Mixed incontinence   . Palpitations     Patient Active Problem List   Diagnosis Date Noted  . Hyperlipidemia 05/17/2017  . Perimenopausal 02/13/2016  . 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 Osteopenia 11/29/2015  . Vitamin D insufficiency 11/29/2015  . Deafness in left ear   . Anxiety 04/08/2013  . Benign essential tremor 04/08/2013    Past Surgical History:  Procedure Laterality Date  . axillary lymph node removal Right 1982   enlarged lymph node  . IUD REMOVAL N/A 04/23/2014   Procedure: INTRAUTERINE DEVICE (IUD) REMOVAL;  Surgeon: Harold Hedge, MD;  Location: [facility];  Service: Gynecology;  Laterality: N/A;  . LAPAROSCOPIC TUBAL LIGATION Bilateral 04/23/2014   Procedure: LAPAROSCOPIC TUBAL LIGATION with Filshie Clips;  Surgeon: Harold Hedge, MD;  Location: [facility];  Service: Gynecology;  Laterality: Bilateral;    OB History    Gravida  1   Para  1   Term  1   Preterm      AB      Living  1     SAB      IAB      Ectopic      Multiple       Live Births  1            Home Medications    Prior to Admission medications   Medication Sig Start Date End Date Taking? Authorizing Provider  Brexpiprazole (REXULTI) 3 MG TABS Take 1 tablet by mouth daily.   Yes [provider]  Cholecalciferol (VITAMIN D3) 2000 units TABS Take 2,000 Units by mouth daily.   Yes [provider]  clonazePAM (KLONOPIN) 0.5 MG tablet Take 0.5 mg by mouth 2 (two) times daily as needed for anxiety.   Yes [provider]  cyanocobalamin 1000 MCG tablet Take 1,000 mcg by mouth daily.   Yes [provider]  estrogens, conjugated, (PREMARIN) 0.45 MG tablet Take 1 tablet daily for 21 days then do not take for 7 days. 05/11/20  Yes Tresea Mall, CNM  lamoTRIgine (LAMICTAL) 200 MG tablet TAKE 1 TABLET BY MOUTH ONCE DAILY 07/20/19 07/19/20 Yes Darliss Ridgel, MD  lamoTRIgine (LAMICTAL) 25 MG tablet TAKE 2 TABLET BY MOUTH DAILY AT NIGHT 01/16/20 01/15/21 Yes Darliss Ridgel, MD  Multiple Vitamins-Minerals (MULTIVITAMIN PO) Take 1 tablet by mouth daily.   Yes [provider]  primidone (MYSOLINE) 50 MG tablet TAKE 1 TABLET BY MOUTH 3 TIMES DAILY 07/30/19 07/29/20 Yes Konz, Autumn Kari, PA-C  propranolol (INDERAL) 20 MG tablet TAKE 1 TABLET BY MOUTH 2 TIMES DAILY 02/05/20 02/04/21 Yes Konz, Autumn Kari, PA-C  sertraline (ZOLOFT) 100 MG tablet Take 200 mg by mouth daily.   Yes [provider]  traZODone (DESYREL) 100 MG tablet Take 100 mg by mouth at bedtime. 04/12/17  Yes [provider]  venlafaxine XR (EFFEXOR-XR) 150 MG 24 hr capsule Take 150 mg by mouth daily with breakfast. 07/22/17  Yes [provider]  Brexpiprazole 3 MG TABS TAKE 1 TABLET BY MOUTH ONCE DAILY 10/16/19 10/15/20  Darliss Ridgel, MD  lamoTRIgine (LAMICTAL) 200 MG tablet Take 200 mg by mouth as directed. 200MG  morning, 50MG  at bedtime 07/02/17   [provider]  primidone (MYSOLINE) 50 MG tablet Take 50 mg by mouth 2 (two) times daily.     [provider]    Family History Family History  Problem Relation Age of Onset  . Depression Mother        bipolar disorder  . Arthritis Mother        osteoprosis  . Heart disease Father   . Heart attack Father 66  . Heart disease Paternal Grandmother   . Cancer Paternal Grandmother        breast cancer  . Colon cancer Neg Hx   . Bladder Cancer Neg Hx   . Kidney cancer Neg Hx     Social History Social History   Tobacco Use  . Smoking status: Never Smoker  . Smokeless tobacco: Never Used  Vaping Use  . Vaping Use: Never used  Substance Use Topics  . Alcohol use: Yes    Alcohol/week: 3.0 standard drinks    Types: 3 Cans of beer per week  . Drug use: No     Allergies   Latex   Review of Systems Review of Systems  As stated above in HPI Physical Exam Triage Vital Signs ED Triage Vitals  Enc Vitals Group     BP 05/15/20 1537 (!) 78/65     Pulse Rate 05/15/20 1537 67     Resp 05/15/20 1537 18     Temp 05/15/20 1537 98.5 F (36.9 C)     Temp Source 05/15/20 1537 Oral     SpO2 05/15/20 1537 99 %     Weight 05/15/20 1533 160 lb (72.6 kg)     Height 05/15/20 1533 5\' 9"  (1.753 m)     Head Circumference --      Peak Flow --      Pain Score 05/15/20 1533 3     Pain Loc --      Pain Edu? --      Excl. in GC? --    No data found.  Updated Vital Signs BP (!) 78/65 (BP Location: Left Arm)   Pulse 67   Temp 98.5 F (36.9 C) (Oral)   Resp 18   Ht 5\' 9"  (1.753 m)   Wt 160 lb (72.6 kg)   SpO2 99%   BMI 23.63 kg/m   Physical Exam Vitals and nursing note reviewed.  Constitutional:      General: She is not in acute distress.    Appearance: Normal appearance. She is not ill-appearing, toxic-appearing or diaphoretic.  HENT:     Head: Normocephalic and atraumatic.  Cardiovascular:     Rate and Rhythm: Normal rate and regular rhythm.  Heart sounds: Normal heart sounds.  Pulmonary:     Effort: Pulmonary effort is normal.     Breath sounds:  Normal breath sounds.  Abdominal:     General: Bowel sounds are normal. There is no distension.     Palpations: Abdomen is soft. There is no mass.     Tenderness: There is no abdominal tenderness. There is no right CVA tenderness, left CVA tenderness, guarding or rebound.     Hernia: No hernia is present.  Musculoskeletal:     Cervical back: Neck supple.  Lymphadenopathy:     Cervical: No cervical adenopathy.  Skin:    General: Skin is warm.  Neurological:     Mental Status: She is alert and oriented to person, place, and time.      UC Treatments / Results  Labs (all labs ordered are listed, but only abnormal results are displayed) Labs Reviewed  URINE CULTURE  URINALYSIS, COMPLETE (UACMP) WITH MICROSCOPIC    EKG   Radiology No results found.  Procedures Procedures (including critical care time)  Medications Ordered in UC Medications - No data to display  Initial Impression / Assessment and Plan / UC Course  I have reviewed the triage vital signs and the nursing notes.  Pertinent labs & imaging results that were available during my care of the patient were reviewed by me and considered in my medical decision making (see chart for details).     New.  Treating with Macrobid given somewhat recent Bactrim use.  Discussed red flag signs and symptoms.  She will hydrate with water..  Urine culture pending. Final Clinical Impressions(s) / UC Diagnoses   Final diagnoses:  None   Discharge Instructions   None    ED Prescriptions    None     PDMP not reviewed this encounter.   Rushie Chestnut, [place] 05/15/20 1605

## 2020-05-15 NOTE — ED Triage Notes (Signed)
Pt c/o pain and burning with urination for about 4-5 days. Pt also reports some nausea today. Pt denies f/v/d, abd pain or other symptoms.

## 2020-05-16 ENCOUNTER — Other Ambulatory Visit (HOSPITAL_COMMUNITY): Payer: Self-pay

## 2020-05-16 LAB — URINE CULTURE: Culture: <10000 — AB

## 2020-05-16 MED ORDER — REXULTI 3 MG PO TABS
ORAL_TABLET | Freq: Every day | ORAL | 2 refills | Status: DC
  Filled 2020-05-16: qty 30, 30d supply, fill #0
  Filled 2020-06-12: qty 30, 30d supply, fill #1

## 2020-05-30 DIAGNOSIS — H5213 Myopia, bilateral: Secondary | ICD-10-CM | POA: Diagnosis not present

## 2020-06-01 ENCOUNTER — Other Ambulatory Visit (HOSPITAL_COMMUNITY): Payer: Self-pay

## 2020-06-09 ENCOUNTER — Other Ambulatory Visit: Payer: Self-pay

## 2020-06-09 MED ORDER — LAMOTRIGINE 200 MG PO TABS
ORAL_TABLET | ORAL | 0 refills | Status: DC
  Filled 2020-06-09: qty 90, 90d supply, fill #0

## 2020-06-13 ENCOUNTER — Other Ambulatory Visit (HOSPITAL_COMMUNITY): Payer: Self-pay

## 2020-06-17 ENCOUNTER — Other Ambulatory Visit (HOSPITAL_COMMUNITY): Payer: Self-pay

## 2020-06-17 DIAGNOSIS — F332 Major depressive disorder, recurrent severe without psychotic features: Secondary | ICD-10-CM | POA: Diagnosis not present

## 2020-06-17 DIAGNOSIS — F410 Panic disorder [episodic paroxysmal anxiety] without agoraphobia: Secondary | ICD-10-CM | POA: Diagnosis not present

## 2020-06-17 DIAGNOSIS — F900 Attention-deficit hyperactivity disorder, predominantly inattentive type: Secondary | ICD-10-CM | POA: Diagnosis not present

## 2020-06-17 MED ORDER — SERTRALINE HCL 100 MG PO TABS
ORAL_TABLET | Freq: Every day | ORAL | 1 refills | Status: DC
  Filled 2020-06-17: qty 180, 90d supply, fill #0
  Filled 2020-09-25: qty 180, 90d supply, fill #1

## 2020-06-17 MED ORDER — REXULTI 3 MG PO TABS
ORAL_TABLET | Freq: Every day | ORAL | 2 refills | Status: DC
  Filled 2020-06-17 – 2020-07-09 (×2): qty 30, 30d supply, fill #0
  Filled 2020-08-07: qty 30, 30d supply, fill #1
  Filled 2020-09-12: qty 30, 30d supply, fill #2

## 2020-06-17 MED ORDER — VENLAFAXINE HCL ER 150 MG PO CP24
ORAL_CAPSULE | ORAL | 1 refills | Status: AC
  Filled 2020-06-17 – 2020-07-09 (×2): qty 90, 90d supply, fill #0

## 2020-06-17 MED ORDER — TRAZODONE HCL 100 MG PO TABS
ORAL_TABLET | ORAL | 1 refills | Status: AC
  Filled 2020-06-17: qty 180, 90d supply, fill #0

## 2020-06-17 MED ORDER — LAMOTRIGINE 200 MG PO TABS
ORAL_TABLET | Freq: Every day | ORAL | 1 refills | Status: AC
  Filled 2020-06-17 – 2020-09-06 (×2): qty 90, 90d supply, fill #0
  Filled 2021-05-29: qty 90, 90d supply, fill #1

## 2020-06-19 MED FILL — Propranolol HCl Tab 20 MG: ORAL | 90 days supply | Qty: 180 | Fill #0 | Status: AC

## 2020-06-20 ENCOUNTER — Other Ambulatory Visit (HOSPITAL_COMMUNITY): Payer: Self-pay

## 2020-06-24 ENCOUNTER — Other Ambulatory Visit (HOSPITAL_COMMUNITY): Payer: Self-pay

## 2020-06-24 ENCOUNTER — Telehealth: Payer: Self-pay | Admitting: Urology

## 2020-06-24 MED ORDER — SILDENAFIL CITRATE 20 MG PO TABS
ORAL_TABLET | ORAL | 0 refills | Status: AC
  Filled 2020-06-24: qty 10, 30d supply, fill #0

## 2020-06-24 NOTE — Telephone Encounter (Signed)
She is having difficulty with achieving orgasms, so we will have a trial of sildenafil 20 mg, on-demand-dosing.  Prescription sent to pharmacy.

## 2020-07-04 ENCOUNTER — Other Ambulatory Visit (HOSPITAL_COMMUNITY): Payer: Self-pay

## 2020-07-12 ENCOUNTER — Other Ambulatory Visit (HOSPITAL_COMMUNITY): Payer: Self-pay

## 2020-08-01 ENCOUNTER — Other Ambulatory Visit (HOSPITAL_COMMUNITY): Payer: Self-pay

## 2020-08-04 ENCOUNTER — Other Ambulatory Visit (HOSPITAL_COMMUNITY): Payer: Self-pay

## 2020-08-04 DIAGNOSIS — R413 Other amnesia: Secondary | ICD-10-CM | POA: Diagnosis not present

## 2020-08-04 DIAGNOSIS — G250 Essential tremor: Secondary | ICD-10-CM | POA: Diagnosis not present

## 2020-08-04 MED ORDER — PROPRANOLOL HCL 20 MG PO TABS
20.0000 mg | ORAL_TABLET | Freq: Two times a day (BID) | ORAL | 3 refills | Status: AC
  Filled 2020-08-04 – 2020-09-25 (×2): qty 180, 90d supply, fill #0
  Filled 2020-12-22: qty 180, 90d supply, fill #1
  Filled 2021-03-28: qty 180, 90d supply, fill #2

## 2020-08-08 ENCOUNTER — Other Ambulatory Visit (HOSPITAL_COMMUNITY): Payer: Self-pay

## 2020-08-30 ENCOUNTER — Ambulatory Visit: Payer: 59 | Admitting: Advanced Practice Midwife

## 2020-08-30 ENCOUNTER — Other Ambulatory Visit (HOSPITAL_COMMUNITY): Payer: Self-pay

## 2020-08-31 ENCOUNTER — Ambulatory Visit (INDEPENDENT_AMBULATORY_CARE_PROVIDER_SITE_OTHER): Payer: 59 | Admitting: Obstetrics and Gynecology

## 2020-08-31 ENCOUNTER — Encounter: Payer: Self-pay | Admitting: Obstetrics and Gynecology

## 2020-08-31 ENCOUNTER — Other Ambulatory Visit: Payer: Self-pay

## 2020-08-31 ENCOUNTER — Other Ambulatory Visit (HOSPITAL_COMMUNITY)
Admission: RE | Admit: 2020-08-31 | Discharge: 2020-08-31 | Disposition: A | Discharge status: Home / Self Care | Payer: 59 | Source: Ambulatory Visit | Attending: Advanced Practice Midwife | Admitting: Advanced Practice Midwife

## 2020-08-31 VITALS — BP 110/60 | Ht 69.0 in | Wt 156.0 lb

## 2020-08-31 DIAGNOSIS — Z113 Encounter for screening for infections with a predominantly sexual mode of transmission: Secondary | ICD-10-CM

## 2020-08-31 NOTE — Progress Notes (Signed)
Smitty Cords, DO   Chief Complaint  Patient presents with   STD testing    HPI:      Ms. Carolyn [name] is a 45 y.o. G1P1001 whose LMP was No LMP recorded. Patient is perimenopausal., presents today for STD testing. No known exposures/sx, just wants to be safe. Has new sex partner since neg full STD testing 4/22. Not using condoms, s/p TL.  Had small vaginal lesion that she was able to "pop" last wk, question hair follicle. Sx resolved, no sx now. Neg pap 10/20  Past Medical History:  Diagnosis Date   Anemia    a. low-normal H/H   Anxiety    Arthritis    osteopenia   Benign essential tremor    a. bilateral upper and lower extremities; b. followed by [facility]; c. felt to be 2/2 antidepressants    Carpal tunnel syndrome    Deafness in left ear    Depression    Mixed incontinence    Palpitations     Past Surgical History:  Procedure Laterality Date   axillary lymph node removal Right 1982   enlarged lymph node   IUD REMOVAL N/A 04/23/2014   Procedure: INTRAUTERINE DEVICE (IUD) REMOVAL;  Surgeon: Harold Hedge, MD;  Location: [facility];  Service: Gynecology;  Laterality: N/A;   LAPAROSCOPIC TUBAL LIGATION Bilateral 04/23/2014   Procedure: LAPAROSCOPIC TUBAL LIGATION with Filshie Clips;  Surgeon: Harold Hedge, MD;  Location: [facility];  Service: Gynecology;  Laterality: Bilateral;    Family History  Problem Relation Age of Onset   Depression Mother        bipolar disorder   Arthritis Mother        osteoprosis   Heart disease Father    Heart attack Father 22   Heart disease Paternal Grandmother    Cancer Paternal Grandmother        breast cancer   Colon cancer Neg Hx    Bladder Cancer Neg Hx    Kidney cancer Neg Hx     Social History   Socioeconomic History   Marital status: Married    Spouse name: Ned   Number of children: 1   Years of education: Not on file   Highest education level: Not on file  Occupational History   Occupation: [employer])  Tobacco Use   Smoking status: Never   Smokeless tobacco: Never  Vaping Use   Vaping Use: Never used  Substance and Sexual Activity   Alcohol use: Yes    Alcohol/week: 3.0 standard drinks    Types: 3 Cans of beer per week   Drug use: No   Sexual activity: Yes    Birth control/protection: Surgical    Comment: Tubal Ligation  Other Topics Concern   Not on file  Social History Narrative   Not on file   Social Determinants of Health   Financial Resource Strain: Not on file  Food Insecurity: Not on file  Transportation Needs: Not on file  Physical Activity: Not on file  Stress: Not on file  Social Connections: Not on file  Intimate Partner Violence: Not on file    Outpatient Medications Prior to Visit  Medication Sig Dispense Refill   Brexpiprazole (REXULTI) 3 MG TABS Take 1 tablet by mouth daily 30 tablet 2   Cholecalciferol (VITAMIN D3) 2000 units TABS Take 2,000 Units by mouth daily.     clonazePAM (KLONOPIN) 0.5 MG tablet Take 0.5 mg by mouth 2 (two) times  daily as needed for anxiety.     estrogens, conjugated, (PREMARIN) 0.625 MG tablet Take 1 tablet by mouth daily. 30 tablet 11   lamoTRIgine (LAMICTAL) 200 MG tablet Take 1 tablet by mouth daily. 90 tablet 1   Multiple Vitamins-Minerals (MULTIVITAMIN PO) Take 1 tablet by mouth daily.     propranolol (INDERAL) 20 MG tablet Take 1 tablet (20 mg total) by mouth 2 (two) times daily 180 tablet 3   sertraline (ZOLOFT) 100 MG tablet Take 2 tablets by mouth daily 180 tablet 1   sildenafil (REVATIO) 20 MG tablet Take 1 tablet 2 hours prior to intercourse on an empty stomach 10 tablet 0   traZODone (DESYREL) 100 MG tablet Take 1-2 tablets by mouth nightly for insomnia as needed 180 tablet 1   venlafaxine XR (EFFEXOR XR) 150 MG 24 hr capsule Take 1 capsule by mouth daily in the morning 90 capsule 1   Brexpiprazole (REXULTI) 3 MG TABS Take 1 tablet by mouth daily.     Brexpiprazole (REXULTI) 3 MG TABS  TAKE 1 TABLET BY MOUTH DAILY 30 tablet 2   Brexpiprazole 3 MG TABS TAKE 1 TABLET BY MOUTH ONCE DAILY 30 tablet 2   cyanocobalamin 1000 MCG tablet Take 1,000 mcg by mouth daily.     estrogens, conjugated, (PREMARIN) 0.45 MG tablet Take 1 tablet daily for 21 days then do not take for 7 days. 30 tablet 11   lamoTRIgine (LAMICTAL) 200 MG tablet Take 200 mg by mouth as directed. 200MG  morning, 50MG  at bedtime  1   lamoTRIgine (LAMICTAL) 200 MG tablet TAKE 1 TABLET BY MOUTH ONCE DAILY 90 tablet 1   lamoTRIgine (LAMICTAL) 25 MG tablet TAKE 2 TABLET BY MOUTH DAILY AT NIGHT 180 tablet 0   nitrofurantoin, macrocrystal-monohydrate, (MACROBID) 100 MG capsule Take 1 capsule (100 mg total) by mouth 2 (two) times daily. 10 capsule 0   primidone (MYSOLINE) 50 MG tablet Take 50 mg by mouth 2 (two) times daily.     primidone (MYSOLINE) 50 MG tablet TAKE 1 TABLET BY MOUTH 3 TIMES DAILY 270 tablet 3   propranolol (INDERAL) 20 MG tablet TAKE 1 TABLET BY MOUTH 2 TIMES DAILY 180 tablet 3   sertraline (ZOLOFT) 100 MG tablet Take 200 mg by mouth daily.     traZODone (DESYREL) 100 MG tablet Take 100 mg by mouth at bedtime.  0   venlafaxine XR (EFFEXOR-XR) 150 MG 24 hr capsule Take 150 mg by mouth daily with breakfast.  0   No facility-administered medications prior to visit.      ROS:  Review of Systems  Constitutional:  Negative for fever.  Gastrointestinal:  Negative for blood in stool, constipation, diarrhea, nausea and vomiting.  Genitourinary:  Negative for dyspareunia, dysuria, flank pain, frequency, hematuria, urgency, vaginal bleeding, vaginal discharge and vaginal pain.  Musculoskeletal:  Negative for back pain.  Skin:  Negative for rash.  BREAST: No symptoms   OBJECTIVE:   Vitals:  BP 110/60   Ht 5\' 9"  (1.753 m)   Wt 156 lb (70.8 kg)   BMI 23.04 kg/m   Physical Exam Vitals reviewed.  Constitutional:      Appearance: She is well-developed.  Pulmonary:     Effort: Pulmonary effort is  normal.  Genitourinary:    General: Normal vulva.     Pubic Area: No rash.      Labia:        Right: No rash, tenderness or lesion.  Left: No rash, tenderness or lesion.      Vagina: Normal. No vaginal discharge, erythema or tenderness.     Cervix: Normal.     Uterus: Normal. Not enlarged and not tender.      Adnexa: Right adnexa normal and left adnexa normal.       Right: No mass or tenderness.         Left: No mass or tenderness.    Musculoskeletal:        General: Normal range of motion.     Cervical back: Normal range of motion.  Skin:    General: Skin is warm and dry.  Neurological:     General: No focal deficit present.     Mental Status: She is alert and oriented to person, place, and time.  Psychiatric:        Mood and Affect: Mood normal.        Behavior: Behavior normal.        Thought Content: Thought content normal.        Judgment: Judgment normal.    Assessment/Plan: Screening for STD (sexually transmitted disease) - Plan: Cervicovaginal ancillary only, HIV Antibody (routine testing w rflx), RPR, HSV 2 antibody, IgG, Hepatitis C antibody    Return if symptoms worsen or fail to improve.  Lashai Grosch B. Arley Garant, PA-C 08/31/2020 3:20 PM

## 2020-08-31 NOTE — Patient Instructions (Signed)
I value your feedback and you entrusting us with your care. If you get a [facility] patient survey, I would appreciate you taking the time to let us know about your experience today. Thank you! ? ? ?

## 2020-09-01 LAB — HIV ANTIBODY (ROUTINE TESTING W REFLEX): HIV Screen 4th Generation wRfx: NONREACTIVE

## 2020-09-01 LAB — HSV 2 ANTIBODY, IGG: HSV 2 IgG, Type Spec: <0.91 index (ref 0.00–0.90)

## 2020-09-01 LAB — HEPATITIS C ANTIBODY: Hep C Virus Ab: <0.1 s/co ratio (ref 0.0–0.9)

## 2020-09-01 LAB — RPR: RPR Ser Ql: NONREACTIVE

## 2020-09-02 LAB — CERVICOVAGINAL ANCILLARY ONLY
Chlamydia: NEGATIVE
Comment: NEGATIVE
Comment: NEGATIVE
Comment: NORMAL
Neisseria Gonorrhea: NEGATIVE
Trichomonas: NEGATIVE

## 2020-09-07 ENCOUNTER — Other Ambulatory Visit (HOSPITAL_COMMUNITY): Payer: Self-pay

## 2020-09-08 ENCOUNTER — Encounter: Payer: Self-pay | Admitting: Obstetrics and Gynecology

## 2020-09-08 DIAGNOSIS — R1031 Right lower quadrant pain: Secondary | ICD-10-CM

## 2020-09-13 ENCOUNTER — Other Ambulatory Visit (HOSPITAL_COMMUNITY): Payer: Self-pay

## 2020-09-26 ENCOUNTER — Other Ambulatory Visit (HOSPITAL_COMMUNITY): Payer: Self-pay

## 2020-09-26 ENCOUNTER — Other Ambulatory Visit: Payer: Self-pay

## 2020-09-26 ENCOUNTER — Ambulatory Visit
Admission: RE | Admit: 2020-09-26 | Discharge: 2020-09-26 | Disposition: A | Discharge status: Home / Self Care | Payer: 59 | Source: Ambulatory Visit | Attending: Obstetrics and Gynecology | Admitting: Obstetrics and Gynecology

## 2020-09-26 DIAGNOSIS — R1031 Right lower quadrant pain: Secondary | ICD-10-CM | POA: Diagnosis not present

## 2020-10-10 ENCOUNTER — Other Ambulatory Visit (HOSPITAL_COMMUNITY): Payer: Self-pay

## 2020-10-10 DIAGNOSIS — F332 Major depressive disorder, recurrent severe without psychotic features: Secondary | ICD-10-CM | POA: Diagnosis not present

## 2020-10-10 DIAGNOSIS — F410 Panic disorder [episodic paroxysmal anxiety] without agoraphobia: Secondary | ICD-10-CM | POA: Diagnosis not present

## 2020-10-10 DIAGNOSIS — F900 Attention-deficit hyperactivity disorder, predominantly inattentive type: Secondary | ICD-10-CM | POA: Diagnosis not present

## 2020-10-10 MED ORDER — LAMOTRIGINE 200 MG PO TABS
ORAL_TABLET | Freq: Every day | ORAL | 1 refills | Status: AC
  Filled 2020-10-10: qty 90, 90d supply, fill #0

## 2020-10-10 MED ORDER — VENLAFAXINE HCL ER 75 MG PO CP24
ORAL_CAPSULE | Freq: Every day | ORAL | 0 refills | Status: DC
  Filled 2020-10-10: qty 90, 90d supply, fill #0

## 2020-10-10 MED ORDER — SERTRALINE HCL 100 MG PO TABS
ORAL_TABLET | Freq: Every day | ORAL | 1 refills | Status: AC
  Filled 2020-10-10: qty 180, 90d supply, fill #0

## 2020-10-10 MED ORDER — REXULTI 3 MG PO TABS
ORAL_TABLET | Freq: Every day | ORAL | 2 refills | Status: AC
  Filled 2020-10-10: qty 30, 30d supply, fill #0
  Filled 2020-11-07: qty 30, 30d supply, fill #1
  Filled 2021-02-08: qty 30, 30d supply, fill #2

## 2020-10-19 DIAGNOSIS — F322 Major depressive disorder, single episode, severe without psychotic features: Secondary | ICD-10-CM | POA: Diagnosis not present

## 2020-10-28 DIAGNOSIS — F322 Major depressive disorder, single episode, severe without psychotic features: Secondary | ICD-10-CM | POA: Diagnosis not present

## 2020-10-29 ENCOUNTER — Other Ambulatory Visit (HOSPITAL_COMMUNITY): Payer: Self-pay

## 2020-11-08 ENCOUNTER — Other Ambulatory Visit (HOSPITAL_COMMUNITY): Payer: Self-pay

## 2020-11-20 ENCOUNTER — Other Ambulatory Visit (HOSPITAL_COMMUNITY): Payer: Self-pay

## 2020-11-20 DIAGNOSIS — F332 Major depressive disorder, recurrent severe without psychotic features: Secondary | ICD-10-CM | POA: Diagnosis not present

## 2020-11-20 DIAGNOSIS — F410 Panic disorder [episodic paroxysmal anxiety] without agoraphobia: Secondary | ICD-10-CM | POA: Diagnosis not present

## 2020-11-20 DIAGNOSIS — F900 Attention-deficit hyperactivity disorder, predominantly inattentive type: Secondary | ICD-10-CM | POA: Diagnosis not present

## 2020-11-20 MED ORDER — VENLAFAXINE HCL ER 75 MG PO CP24
ORAL_CAPSULE | Freq: Every day | ORAL | 0 refills | Status: AC
  Filled 2020-11-20: qty 90, 90d supply, fill #0

## 2020-11-20 MED ORDER — TRAZODONE HCL 50 MG PO TABS
ORAL_TABLET | ORAL | 0 refills | Status: AC
  Filled 2020-11-20: qty 180, 90d supply, fill #0

## 2020-11-20 MED ORDER — SERTRALINE HCL 100 MG PO TABS
ORAL_TABLET | Freq: Every day | ORAL | 1 refills | Status: AC
  Filled 2020-12-22: qty 180, 90d supply, fill #0

## 2020-11-20 MED ORDER — LAMOTRIGINE 200 MG PO TABS
ORAL_TABLET | Freq: Every day | ORAL | 1 refills | Status: AC
  Filled 2020-11-20: qty 90, 90d supply, fill #0
  Filled 2020-12-22 – 2021-03-06 (×2): qty 90, 90d supply, fill #1

## 2020-11-20 MED ORDER — REXULTI 3 MG PO TABS
ORAL_TABLET | Freq: Every day | ORAL | 2 refills | Status: AC
  Filled 2020-11-20: qty 30, 30d supply, fill #0
  Filled 2020-12-22 – 2021-01-09 (×2): qty 30, 30d supply, fill #1
  Filled 2021-04-09: qty 30, 30d supply, fill #2

## 2020-11-21 ENCOUNTER — Other Ambulatory Visit (HOSPITAL_COMMUNITY): Payer: Self-pay

## 2020-11-28 ENCOUNTER — Other Ambulatory Visit (HOSPITAL_COMMUNITY): Payer: Self-pay

## 2020-11-28 ENCOUNTER — Encounter: Payer: Self-pay | Admitting: Family Medicine

## 2020-11-28 DIAGNOSIS — F322 Major depressive disorder, single episode, severe without psychotic features: Secondary | ICD-10-CM | POA: Diagnosis not present

## 2020-11-28 DIAGNOSIS — M7711 Lateral epicondylitis, right elbow: Secondary | ICD-10-CM

## 2020-12-02 ENCOUNTER — Other Ambulatory Visit (HOSPITAL_COMMUNITY): Payer: Self-pay

## 2020-12-05 NOTE — Addendum Note (Signed)
Addended by: Smitty Cords on: 12/05/2020 05:37 PM   Modules accepted: Orders

## 2020-12-07 DIAGNOSIS — F322 Major depressive disorder, single episode, severe without psychotic features: Secondary | ICD-10-CM | POA: Diagnosis not present

## 2020-12-19 DIAGNOSIS — F322 Major depressive disorder, single episode, severe without psychotic features: Secondary | ICD-10-CM | POA: Diagnosis not present

## 2020-12-22 ENCOUNTER — Other Ambulatory Visit (HOSPITAL_COMMUNITY): Payer: Self-pay

## 2020-12-23 ENCOUNTER — Other Ambulatory Visit (HOSPITAL_COMMUNITY): Payer: Self-pay

## 2020-12-29 ENCOUNTER — Other Ambulatory Visit: Payer: Self-pay

## 2020-12-29 ENCOUNTER — Ambulatory Visit: Payer: 59 | Attending: Family Medicine

## 2020-12-29 DIAGNOSIS — M7711 Lateral epicondylitis, right elbow: Secondary | ICD-10-CM

## 2020-12-29 DIAGNOSIS — M6281 Muscle weakness (generalized): Secondary | ICD-10-CM

## 2020-12-30 NOTE — Therapy (Signed)
[facility] MAIN [facility] SERVICES [address] [place], [place], [address] Phone: [phone]   Fax:  [phone]  Occupational Therapy Evaluation  Patient Details  Name: Carolyn [name] MRN: [id number] Date of Birth: 11/30/1975 Referring Provider (OT): Dr. Saralyn Pilar   Encounter Date: 12/29/2020   OT End of Session - 12/30/20 1023     Visit Number 1    Number of Visits 16    Date for OT Re-Evaluation 02/22/21    Authorization Time Period Reporting period beginning 12/29/20    OT Start Time 1103    OT Stop Time 1155    OT Time Calculation (min) 52 min    Activity Tolerance Patient tolerated treatment well    Behavior During Therapy [facility] for tasks assessed/performed             Past Medical History:  Diagnosis Date   Anemia    a. low-normal H/H   Anxiety    Arthritis    osteopenia   Benign essential tremor    a. bilateral upper and lower extremities; b. followed by [facility]; c. felt to be 2/2 antidepressants    Carpal tunnel syndrome    Deafness in left ear    Depression    Mixed incontinence    Palpitations     Past Surgical History:  Procedure Laterality Date   axillary lymph node removal Right 1982   enlarged lymph node   IUD REMOVAL N/A 04/23/2014   Procedure: INTRAUTERINE DEVICE (IUD) REMOVAL;  Surgeon: Harold Hedge, MD;  Location: [facility];  Service: Gynecology;  Laterality: N/A;   LAPAROSCOPIC TUBAL LIGATION Bilateral 04/23/2014   Procedure: LAPAROSCOPIC TUBAL LIGATION with Filshie Clips;  Surgeon: Harold Hedge, MD;  Location: [facility];  Service: Gynecology;  Laterality: Bilateral;    There were no vitals filed for this visit.   Subjective Assessment - 12/30/20 0806     Subjective  "I woke up one day with elbow pain and it's been hurting ever since.  It's been about 3 months."    Pertinent History R lateral epicondylitis, dominant arm    Limitations R elbow pain, dorsal forearm tenderness, weak grip    Patient  Stated Goals Decrease pain.    Currently in Pain? Yes    Pain Score 4    4/10 at rest, 7/10 with activity   Pain Location Elbow    Pain Orientation Right    Pain Descriptors / Indicators Tightness;Tender    Pain Type Chronic pain    Pain Radiating Towards forearm    Pain Onset More than a month ago    Pain Frequency Constant    Aggravating Factors  gripping, elbow ROM, lifting, pushing    Pain Relieving Factors rest    Effect of Pain on Daily Activities pain with gripping, lifting, pushing    Multiple Pain Sites No               [facility] OT Assessment - 12/30/20 0001       Assessment   Medical Diagnosis R lateral epicondylitis    Referring Provider (OT) Dr. 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[employer], make pottery, and stain glass      ADL   ADL comments Indep with ADLs but with severe (7/10 pain in R elbow) with activity      Cognition   Overall Cognitive Status Within Functional Limits for tasks assessed      Observation/Other Assessments   Focus on Therapeutic Outcomes (FOTO)  76      Sensation   Light Touch Appears Intact      Coordination   Gross Motor Movements are Fluid and Coordinated Yes    Fine Motor Movements are Fluid and Coordinated Yes    Right 9 Hole Peg Test 23 sec   WNL   Left 9 Hole Peg Test 25 sec   WNL     AROM   Overall AROM Comments R elbow ext -7, full flexion      Strength   Overall Strength Comments R elbow flex 4, ext 3+ (limited by pain)      Hand Function   Right Hand Grip (lbs) 35    Right Hand Lateral Pinch 11 lbs    Right Hand 3 Point Pinch 9 lbs    Left Hand Grip (lbs) 64    Left  Hand Lateral Pinch 14 lbs    Left 3 point pinch 13 lbs            Occupational Therapy Evaluation: Pt is a 45 y/o female referred to outpatient OT following ~3 months pain in R elbow, forearm.  Dx with R lateral epicondylitis.  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  Pt presents with distal RUE weakness, pain, ROM limitations, decreased engagement of R dominant arm for lifting/grasping/carrying ADL supplies.  Pt will benefit from skilled OT for modalities for pain management, instruction in HEP for stretching/strengthening R dominant arm, instruction in work space modifications as needed and activity modifications to increase ADL tolerance.  Pt is not yet icing but has been encouraged to begin icing at least 3x per day x20 min to R elbow/forearm.  Made recommendation for forearm/elbow strap to be worn during work and driving (pt drives a stick shift car).  Pt plans to obtain.  Pt in agreement with OT poc.    Manual Therapy: Cross tissue massage performed to R elbow/lateral epicondyle, upper arm, dorsal aspect of forearm, working to reduce pain and inflammation.      OT Education - 12/30/20 1022     Education Details OT role, goals, poc, RICE protocol, recommendation for forearm/elbow strap    Person(s) Educated Patient    Methods Explanation;Verbal cues    Comprehension Verbalized understanding;Returned demonstration;Verbal cues required;Need further instruction              OT Short Term Goals - 12/30/20 1028       OT SHORT TERM GOAL #1   Title Pt will be indep to perform HEP including stretching and strengthening distal RUE.    Baseline Eval: Initiated self passive stretching for forearm extensors; need to issue putty    Time 4    Period Weeks    Status New    Target Date 01/25/21               OT Long Term Goals - 12/30/20 1030       OT LONG TERM GOAL #1   Title Pt will increase FOTO score to 80 or better to indicate  increased  functional performance.    Baseline Eval: FOTO 83    Time 8    Period Weeks    Status New    Target Date 02/22/21      OT LONG TERM GOAL #2   Title Pt will be 75% compliant with RICE protocol for reducing pain and inflammation in R elbow and forearm.    Baseline Eval: Pt not icing, but agreeable to start and receptive to order forearm/elbow strap for "rest and compression" for R elbow/forearm muscles.    Time 8    Period Weeks    Status New    Target Date 02/22/21      OT LONG TERM GOAL #3   Title Pt will tolerate modalities for decreasing R elbow pain from 7/10 to 4/10 or less with activity.    Baseline Eval: R elbow pain 7/10 with activity    Time 8    Period Weeks    Status New    Target Date 02/22/21      OT LONG TERM GOAL #4   Title Pt will increase R grip strength by 10 or more lbs to enable increased tolerance for holding and carrying ADL supplies in R dominant arm.    Baseline Eval: R grip 35 lbs (L non dominant 64 lbs)    Time 8    Period Weeks    Status New    Target Date 02/22/21      OT LONG TERM GOAL #5   Title Pt will be 75% compliant with activity modifications/work space adaptations for joint protection and reducing risk of further cummulative trauma injury.    Baseline Eval: Pt reports desk at work is too high and causes too much elbow flexion while typing; activity modification not yet initiated at eval    Time 8    Period Weeks    Status New    Target Date 02/25/21              Plan - 12/30/20 1025     Clinical Impression Statement Pt is a 45 y/o female referred to outpatient OT following ~3 months pain in R elbow, forearm.  Dx with R lateral epicondylitis.  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  Pt presents with distal RUE weakness, pain, ROM limitations, decreased engagement of R dominant arm for lifting/grasping/carrying ADL supplies.  Pt will benefit from skilled OT for modalities for pain management, instruction in  HEP for stretching/strengthening R dominant arm, instruction in work space modifications as needed and activity modifications to increase ADL tolerance.  Pt is not yet icing but has been encouraged to begin icing at least 3x per day x20 min to R elbow/forearm.  Made recommendation for forearm/elbow strap to be worn during work and driving (pt drives a stick shift car).  Pt plans to obtain.  Pt in agreement with OT poc.    OT Occupational Profile and History Problem Focused Assessment - Including review of records relating to presenting problem    Occupational performance deficits (Please refer to evaluation for details): ADL's;IADL's;Leisure;Work    Games developer / Function / Physical Skills ADL;UE functional use;Decreased knowledge of precautions;Body mechanics;Flexibility;IADL;Pain;Strength;Edema;ROM    Rehab Potential Good    Clinical Decision Making Limited treatment options, no task modification necessary    Comorbidities Affecting Occupational Performance: None    Modification or Assistance to Complete Evaluation  No modification of tasks or assist necessary to complete eval  OT Frequency 2x / week    OT Duration 8 weeks    OT Treatment/Interventions Self-care/ADL training;Cryotherapy;Therapeutic exercise;DME and/or AE instruction;Ultrasound;Manual Therapy;Splinting;Moist Heat;Iontophoresis;Passive range of motion;Therapeutic activities;Patient/family education;Electrical Stimulation    Plan OT for pain management, cross tissue massage, ROM/strengthening, instruction in joint protection strategies, and recommendations for work space modifications    OT Home Exercise Plan RICE protocol, elbow, wrist, FA stretching; will plan to issue putty    Consulted and Agree with Plan of Care Patient             Patient will benefit from skilled therapeutic intervention in order to improve the following deficits and impairments:   Body Structure / Function / Physical Skills: ADL, UE functional use,  Decreased knowledge of precautions, Body mechanics, Flexibility, IADL, Pain, Strength, Edema, ROM       Visit Diagnosis: Lateral epicondylitis of right elbow  Muscle weakness (generalized)    Problem List Patient Active Problem List   Diagnosis Date Noted   Hyperlipidemia 05/17/2017   Perimenopausal 02/13/2016   Degenerative joint disease (DJD) of lumbar spine 11/29/2015   Osteopenia 11/29/2015   Vitamin D insufficiency 11/29/2015   Deafness in left ear    Anxiety 04/08/2013   Benign essential tremor 04/08/2013   Danelle Earthly, MS, OTR/L  Otis Dials, OT 12/30/2020, 10:51 AM  [facility] MAIN [facility] SERVICES [address] [place], [place], [address] Phone: [phone]   Fax:  [phone]  Name: Carolyn [name] MRN: [id number] Date of Birth: [birth year]

## 2021-01-04 ENCOUNTER — Ambulatory Visit: Payer: 59 | Admitting: Occupational Therapy

## 2021-01-04 ENCOUNTER — Other Ambulatory Visit: Payer: Self-pay

## 2021-01-04 DIAGNOSIS — M6281 Muscle weakness (generalized): Secondary | ICD-10-CM | POA: Diagnosis not present

## 2021-01-04 DIAGNOSIS — M7711 Lateral epicondylitis, right elbow: Secondary | ICD-10-CM | POA: Diagnosis not present

## 2021-01-05 ENCOUNTER — Encounter: Payer: Self-pay | Admitting: Occupational Therapy

## 2021-01-05 ENCOUNTER — Ambulatory Visit: Payer: 59

## 2021-01-05 DIAGNOSIS — M6281 Muscle weakness (generalized): Secondary | ICD-10-CM

## 2021-01-05 DIAGNOSIS — M7711 Lateral epicondylitis, right elbow: Secondary | ICD-10-CM | POA: Diagnosis not present

## 2021-01-05 NOTE — Therapy (Signed)
[facility] MAIN [facility] SERVICES [address] [place], [place], [address] Phone: [phone]   Fax:  [phone]  Occupational Therapy Treatment  Patient Details  Name: Carolyn [name] MRN: [id number] Date of Birth: 1975-03-27 Referring Provider (OT): Dr. Saralyn Pilar   Encounter Date: 01/04/2021   OT End of Session - 01/05/21 0903     Visit Number 2    Number of Visits 16    Date for OT Re-Evaluation 02/22/21    Authorization Time Period Reporting period beginning 12/29/20    OT Start Time 1645    OT Stop Time 1730    OT Time Calculation (min) 45 min    Activity Tolerance Patient tolerated treatment well    Behavior During Therapy [facility] for tasks assessed/performed             Past Medical History:  Diagnosis Date   Anemia    a. low-normal H/H   Anxiety    Arthritis    osteopenia   Benign essential tremor    a. bilateral upper and lower extremities; b. followed by [facility]; c. felt to be 2/2 antidepressants    Carpal tunnel syndrome    Deafness in left ear    Depression    Mixed incontinence    Palpitations     Past Surgical History:  Procedure Laterality Date   axillary lymph node removal Right 1982   enlarged lymph node   IUD REMOVAL N/A 04/23/2014   Procedure: INTRAUTERINE DEVICE (IUD) REMOVAL;  Surgeon: Harold Hedge, MD;  Location: [facility];  Service: Gynecology;  Laterality: N/A;   LAPAROSCOPIC TUBAL LIGATION Bilateral 04/23/2014   Procedure: LAPAROSCOPIC TUBAL LIGATION with Filshie Clips;  Surgeon: Harold Hedge, MD;  Location: [facility];  Service: Gynecology;  Laterality: Bilateral;    There were no vitals filed for this visit.   Subjective Assessment - 01/05/21 0858     Currently in Pain? Yes    Pain Score 2     Pain Location Elbow    Pain Orientation Right    Pain Descriptors / Indicators Tightness;Tender            OT TREATMENT    Ultrasound:  Pt. tolerated Ultrasound at the right elbow at 3.3 MHz,  20% frequency, 1.0 Intensity, for 8 min.Ultrasound was perfromed prior to manual therapy, and there. Ex secondary to pain.  Therapeutic Exercise:  Pt. Performed right wrist flexion exercises with the the elbow extended with resistance. Pt. Performed AROM for wrist extension with the elbow extended without resistance, AROM for supination/pronation with the elbow flexed at 90 degrees, and radial deviation. Pt. Worked on  gross gripping, lateral pinch,a nd 3pt. pinch with light resistive Tan putty. Pt. Was provided with a visual handout HEP through [employer].   Manual Therapy:  Pt. Tolerated soft tissue massage, and cross friction massage at the lateral aspect of the right elbow,  musculature of the forearm, and upper arm proximal to the elbow. Manual therapy was performed independent of, and in preparation for there. Ex.   Pt. Reports wearing the compression brace at work, and when sleeping. Pt. Education was provided about positioning of the right UE when sleeping. Reviewed ergonomic strategies for the work space. Pt. was able to demonstrate proper there. Ex. Technique with the stretches, and HEP. Pt. Reports having 2/10 pain. Pt. Continues to work on decreasing pain, and improving right UE ROM, grip, and pinch strength, and review ergonomic, and work simplification strategies in order to  to  improve function during ADLs, and IADL tasks.                    OT Education - 01/05/21 0902     Education Details HEP, scar massage, positioning    Person(s) Educated Patient    Methods Explanation;Verbal cues    Comprehension Verbalized understanding;Returned demonstration;Verbal cues required;Need further instruction              OT Short Term Goals - 12/30/20 1028       OT SHORT TERM GOAL #1   Title Pt will be indep to perform HEP including stretching and strengthening distal RUE.    Baseline Eval: Initiated self passive stretching for forearm extensors; need to issue putty     Time 4    Period Weeks    Status New    Target Date 01/25/21               OT Long Term Goals - 12/30/20 1030       OT LONG TERM GOAL #1   Title Pt will increase FOTO score to 80 or better to indicate increased functional performance.    Baseline Eval: FOTO 83    Time 8    Period Weeks    Status New    Target Date 02/22/21      OT LONG TERM GOAL #2   Title Pt will be 75% compliant with RICE protocol for reducing pain and inflammation in R elbow and forearm.    Baseline Eval: Pt not icing, but agreeable to start and receptive to order forearm/elbow strap for "rest and compression" for R elbow/forearm muscles.    Time 8    Period Weeks    Status New    Target Date 02/22/21      OT LONG TERM GOAL #3   Title Pt will tolerate modalities for decreasing R elbow pain from 7/10 to 4/10 or less with activity.    Baseline Eval: R elbow pain 7/10 with activity    Time 8    Period Weeks    Status New    Target Date 02/22/21      OT LONG TERM GOAL #4   Title Pt will increase R grip strength by 10 or more lbs to enable increased tolerance for holding and carrying ADL supplies in R dominant arm.    Baseline Eval: R grip 35 lbs (L non dominant 64 lbs)    Time 8    Period Weeks    Status New    Target Date 02/22/21      OT LONG TERM GOAL #5   Title Pt will be 75% compliant with activity modifications/work space adaptations for joint protection and reducing risk of further cummulative trauma injury.    Baseline Eval: Pt reports desk at work is too high and causes too much elbow flexion while typing; activity modification not yet initiated at eval    Time 8    Period Weeks    Status New    Target Date 02/25/21                   Plan - 01/05/21 0904     Clinical Impression Statement p    OT Occupational Profile and History Problem Focused Assessment - Including review of records relating to presenting problem    Occupational performance deficits (Please refer to  evaluation for details): ADL's;IADL's;Leisure;Work    Games developer / Function / Physical Skills ADL;UE functional use;Decreased knowledge  of precautions;Body mechanics;Flexibility;IADL;Pain;Strength;Edema;ROM    Rehab Potential Good    Clinical Decision Making Limited treatment options, no task modification necessary    Comorbidities Affecting Occupational Performance: None    Modification or Assistance to Complete Evaluation  No modification of tasks or assist necessary to complete eval    OT Frequency 2x / week    OT Duration 8 weeks    OT Treatment/Interventions Self-care/ADL training;Cryotherapy;Therapeutic exercise;DME and/or AE instruction;Ultrasound;Manual Therapy;Splinting;Moist Heat;Iontophoresis;Passive range of motion;Therapeutic activities;Patient/family education;Electrical Stimulation    Plan OT for pain management, cross tissue massage, ROM/strengthening, instruction in joint protection strategies, and recommendations for work space modifications    OT Home Exercise Plan RICE protocol, elbow, wrist, FA stretching; will plan to issue putty    Consulted and Agree with Plan of Care Patient             Patient will benefit from skilled therapeutic intervention in order to improve the following deficits and impairments:   Body Structure / Function / Physical Skills: ADL, UE functional use, Decreased knowledge of precautions, Body mechanics, Flexibility, IADL, Pain, Strength, Edema, ROM       Visit Diagnosis: Muscle weakness (generalized)    Problem List Patient Active Problem List   Diagnosis Date Noted   Hyperlipidemia 05/17/2017   Perimenopausal 02/13/2016   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 [place], [place], [address] Phone: [phone]   Fax:  [phone]  Name: FAYOLA MECKES MRN: [id number] Date of Birth: Jul 27, 1975

## 2021-01-06 NOTE — Therapy (Signed)
[facility] MAIN [facility] SERVICES [address] [place], [place], [address] Phone: [phone]   Fax:  [phone]  Occupational Therapy Treatment  Patient Details  Name: Carolyn [name] MRN: [id number] Date of Birth: 1975-01-10 Referring Provider (OT): Dr. Saralyn Pilar   Encounter Date: 01/05/2021   OT End of Session - 01/06/21 0852     Visit Number 3    Number of Visits 16    Date for OT Re-Evaluation 02/22/21    Authorization Time Period Reporting period beginning 12/29/20    OT Start Time 1645    OT Stop Time 1728    OT Time Calculation (min) 43 min    Activity Tolerance Patient tolerated treatment well    Behavior During Therapy [facility] for tasks assessed/performed             Past Medical History:  Diagnosis Date   Anemia    a. low-normal H/H   Anxiety    Arthritis    osteopenia   Benign essential tremor    a. bilateral upper and lower extremities; b. followed by [facility]; c. felt to be 2/2 antidepressants    Carpal tunnel syndrome    Deafness in left ear    Depression    Mixed incontinence    Palpitations     Past Surgical History:  Procedure Laterality Date   axillary lymph node removal Right 1982   enlarged lymph node   IUD REMOVAL N/A 04/23/2014   Procedure: INTRAUTERINE DEVICE (IUD) REMOVAL;  Surgeon: Harold Hedge, MD;  Location: [facility];  Service: Gynecology;  Laterality: N/A;   LAPAROSCOPIC TUBAL LIGATION Bilateral 04/23/2014   Procedure: LAPAROSCOPIC TUBAL LIGATION with Filshie Clips;  Surgeon: Harold Hedge, MD;  Location: [facility];  Service: Gynecology;  Laterality: Bilateral;    There were no vitals filed for this visit.   Subjective Assessment - 01/05/21 0849     Subjective  "It's feeling a lot better.  I can straighten it more." (pt referring to elbow)    Pertinent History R lateral epicondylitis, dominant arm    Limitations R elbow pain, dorsal forearm tenderness, weak grip    Patient Stated Goals  Decrease pain.    Currently in Pain? Yes    Pain Score 3    3 with activity, 1 at rest   Pain Location Elbow    Pain Orientation Right    Pain Descriptors / Indicators Tightness;Tender    Pain Type Chronic pain    Pain Radiating Towards forearm    Pain Onset More than a month ago    Pain Frequency Constant    Aggravating Factors  gripping, elbow ROM, lifting, pushing    Pain Relieving Factors rest    Effect of Pain on Daily Activities pain with gripping, lifting, pushing    Multiple Pain Sites No            Occupational Therapy Treatment: Iontophoresis: 8 min set up 2cc dexamethasone applied to R lateral elbow patch, 20 min tx, 40 mA/min, current 2.0  Manual Therapy: Cross tissue massage performed to R lateral elbow, upper arm, dorsal aspect of forearm, working to reduce pain and inflammation.  Reviewed self passive stretching for R elbow and forearm with good return demo.  OT assisted with R arm hang to allow gravity to aid in extension, with gentle passive ext from OT to achieve max end range with good tolerance.    Self Care: Educated on ergonomic positioning recommendations when typing at work.  Pt reports desk is too high (desk does not adjust) and chair is at it's max height.  Made recommendation for pt to obtain seat cushion to elevate her self in her chair, though pt may then need foot rest to ensure proper 90 degree angles for hips and knees.  Reinforced positioning strategies for night time, encouraging pt to avoid extreme elbow flexion and attempt to extend elbow angle by hugging a large pillow.  Pt receptive to all recommendations.   Response to Treatment: See Plan/clinical impression below.    OT Education - 01/05/21 [phone]     Education Details recommendations for work space modifications (positioning at desk)    Person(s) Educated Patient    Methods Explanation;Verbal cues    Comprehension Verbalized understanding;Verbal cues required;Need further instruction               OT Short Term Goals - 12/30/20 1028       OT SHORT TERM GOAL #1   Title Pt will be indep to perform HEP including stretching and strengthening distal RUE.    Baseline Eval: Initiated self passive stretching for forearm extensors; need to issue putty    Time 4    Period Weeks    Status New    Target Date 01/25/21               OT Long Term Goals - 12/30/20 1030       OT LONG TERM GOAL #1   Title Pt will increase FOTO score to 80 or better to indicate increased functional performance.    Baseline Eval: FOTO 83    Time 8    Period Weeks    Status New    Target Date 02/22/21      OT LONG TERM GOAL #2   Title Pt will be 75% compliant with RICE protocol for reducing pain and inflammation in R elbow and forearm.    Baseline Eval: Pt not icing, but agreeable to start and receptive to order forearm/elbow strap for "rest and compression" for R elbow/forearm muscles.    Time 8    Period Weeks    Status New    Target Date 02/22/21      OT LONG TERM GOAL #3   Title Pt will tolerate modalities for decreasing R elbow pain from 7/10 to 4/10 or less with activity.    Baseline Eval: R elbow pain 7/10 with activity    Time 8    Period Weeks    Status New    Target Date 02/22/21      OT LONG TERM GOAL #4   Title Pt will increase R grip strength by 10 or more lbs to enable increased tolerance for holding and carrying ADL supplies in R dominant arm.    Baseline Eval: R grip 35 lbs (L non dominant 64 lbs)    Time 8    Period Weeks    Status New    Target Date 02/22/21      OT LONG TERM GOAL #5   Title Pt will be 75% compliant with activity modifications/work space adaptations for joint protection and reducing risk of further cummulative trauma injury.    Baseline Eval: Pt reports desk at work is too high and causes too much elbow flexion while typing; activity modification not yet initiated at eval    Time 8    Period Weeks    Status New    Target Date 02/25/21  Plan - 01/05/21 0907     Clinical Impression Statement Pt reports that her new elbow strap, icing, and stretching regimen has significantly reduced pain in R elbow.  Pt now shows full elbow extension with only slight discomfort.  Pt now reporting 3/10 pain with activity, 1/10 pain at rest in R elbow and forearm.  Pt receptive to all recommendations for work space modifications, sleep positioning adjustments, and shows good return demo of self active and passive stretches for R elbow, forearm, and wrist.  Pt will continue to benefit from skilled OT for modalities for pain management, instruction in HEP progression for stretching/strengthening R dominant arm, additional instruction in work space modifications as needed and activity modifications to increase ADL tolerance.    OT Occupational Profile and History Problem Focused Assessment - Including review of records relating to presenting problem    Occupational performance deficits (Please refer to evaluation for details): ADL's;IADL's;Leisure;Work    Games developer / Function / Physical Skills ADL;UE functional use;Decreased knowledge of precautions;Body mechanics;Flexibility;IADL;Pain;Strength;Edema;ROM    Rehab Potential Good    Clinical Decision Making Limited treatment options, no task modification necessary    Comorbidities Affecting Occupational Performance: None    Modification or Assistance to Complete Evaluation  No modification of tasks or assist necessary to complete eval    OT Frequency 2x / week    OT Duration 8 weeks    OT Treatment/Interventions Self-care/ADL training;Cryotherapy;Therapeutic exercise;DME and/or AE instruction;Ultrasound;Manual Therapy;Splinting;Moist Heat;Iontophoresis;Passive range of motion;Therapeutic activities;Patient/family education;Electrical Stimulation    Plan OT for pain management, cross tissue massage, ROM/strengthening, instruction in joint protection strategies, and recommendations for  work space modifications    OT Home Exercise Plan RICE protocol, elbow, wrist, FA stretching; will plan to issue putty    Consulted and Agree with Plan of Care Patient             Patient will benefit from skilled therapeutic intervention in order to improve the following deficits and impairments:   Body Structure / Function / Physical Skills: ADL, UE functional use, Decreased knowledge of precautions, Body mechanics, Flexibility, IADL, Pain, Strength, Edema, ROM       Visit Diagnosis: Muscle weakness (generalized)  Lateral epicondylitis of right elbow    Problem List Patient Active Problem List   Diagnosis Date Noted   Hyperlipidemia 05/17/2017   Perimenopausal 02/13/2016   Degenerative joint disease (DJD) of lumbar spine 11/29/2015   Osteopenia 11/29/2015   Vitamin D insufficiency 11/29/2015   Deafness in left ear    Anxiety 04/08/2013   Benign essential tremor 04/08/2013   Danelle Earthly, MS, OTR/L  Otis Dials, OT 01/06/2021, 9:08 AM  [place] [facility] MAIN [facility] SERVICES [address] [place], [place], [address] Phone: [phone]   Fax:  [phone]  Name: Carolyn [name] MRN: [id number] Date of Birth: Jan 16, 1975

## 2021-01-09 ENCOUNTER — Other Ambulatory Visit (HOSPITAL_COMMUNITY): Payer: Self-pay

## 2021-01-10 ENCOUNTER — Other Ambulatory Visit: Payer: Self-pay

## 2021-01-10 ENCOUNTER — Ambulatory Visit: Payer: 59 | Attending: Family Medicine

## 2021-01-10 DIAGNOSIS — M7711 Lateral epicondylitis, right elbow: Secondary | ICD-10-CM | POA: Insufficient documentation

## 2021-01-10 DIAGNOSIS — M6281 Muscle weakness (generalized): Secondary | ICD-10-CM | POA: Insufficient documentation

## 2021-01-11 NOTE — Therapy (Signed)
[place] [facility] MAIN [facility] SERVICES [address] [place], [place], [address] Phone: [phone]   Fax:  [phone]  Occupational Therapy Treatment  Patient Details  Name: Carolyn [name] MRN: [id number] Date of Birth: [birth year] Referring Provider (OT): Dr. Saralyn Pilar   Encounter Date: 01/10/2021   OT End of Session - 01/11/21 1530     Visit Number 4    Number of Visits 16    Date for OT Re-Evaluation 02/22/21    Authorization Time Period Reporting period beginning 12/29/20    OT Start Time 1645    OT Stop Time 1735    OT Time Calculation (min) 50 min    Activity Tolerance Patient tolerated treatment well    Behavior During Therapy [facility] for tasks assessed/performed             Past Medical History:  Diagnosis Date   Anemia    a. low-normal H/H   Anxiety    Arthritis    osteopenia   Benign essential tremor    a. bilateral upper and lower extremities; b. followed by [facility]; c. felt to be 2/2 antidepressants    Carpal tunnel syndrome    Deafness in left ear    Depression    Mixed incontinence    Palpitations     Past Surgical History:  Procedure Laterality Date   axillary lymph node removal Right 1982   enlarged lymph node   IUD REMOVAL N/A 04/23/2014   Procedure: INTRAUTERINE DEVICE (IUD) REMOVAL;  Surgeon: Harold Hedge, MD;  Location: [facility];  Service: Gynecology;  Laterality: N/A;   LAPAROSCOPIC TUBAL LIGATION Bilateral 04/23/2014   Procedure: LAPAROSCOPIC TUBAL LIGATION with Filshie Clips;  Surgeon: Harold Hedge, MD;  Location: [facility];  Service: Gynecology;  Laterality: Bilateral;    There were no vitals filed for this visit.   Subjective Assessment - 01/10/21 1722     Pertinent History R lateral epicondylitis, dominant arm    Limitations R elbow pain, dorsal forearm tenderness, weak grip    Patient Stated Goals Decrease pain.    Pain Onset More than a month ago            Occupational Therapy  Treatment: Iontophoresis: 8 min  2cc dexamethasone applied to R lateral elbow patch, 20 min tx, 40 mA/min, current 2.0  Manual Therapy: Cross tissue massage performed to R lateral elbow, upper arm, dorsal aspect of forearm, working to reduce pain and inflammation.  Reviewed self passive stretching for R elbow and forearm with good return demo.  OT assisted with R arm hang to allow gravity to aid in extension, with gentle passive ext from OT to achieve max end range with good tolerance.  Performed wrist hang from arm wedge using 3 lb weight to facilitate wrist flex stretch, extension stretch, then radial deviation stretch, holding weight x20 sec each.  Performed cross tissue massage to wrist and digit flexors and extensors during stretch.    Therapeutic Activity: Issued dycem and encouraged pt use for opening difficult containers and jars.  Reviewed RICE protocol and instructed pt in how to make home ice pack to enable pt to ice at work.  Recommended 2 gallon zip lock bags and fill 3-1 water/rubbing alcohol to make gel pack.  Pt receptive and verbalized understanding.   Response to Treatment: See Plan/clinical impression below.        OT Education - 01/11/21 1530     Education Details RICE protocol review    Person(s) Educated  Patient    Methods Explanation;Verbal cues    Comprehension Verbalized understanding;Returned demonstration;Verbal cues required;Need further instruction              OT Short Term Goals - 12/30/20 1028       OT SHORT TERM GOAL #1   Title Pt will be indep to perform HEP including stretching and strengthening distal RUE.    Baseline Eval: Initiated self passive stretching for forearm extensors; need to issue putty    Time 4    Period Weeks    Status New    Target Date 01/25/21               OT Long Term Goals - 12/30/20 1030       OT LONG TERM GOAL #1   Title Pt will increase FOTO score to 80 or better to indicate increased functional  performance.    Baseline Eval: FOTO 83    Time 8    Period Weeks    Status New    Target Date 02/22/21      OT LONG TERM GOAL #2   Title Pt will be 75% compliant with RICE protocol for reducing pain and inflammation in R elbow and forearm.    Baseline Eval: Pt not icing, but agreeable to start and receptive to order forearm/elbow strap for "rest and compression" for R elbow/forearm muscles.    Time 8    Period Weeks    Status New    Target Date 02/22/21      OT LONG TERM GOAL #3   Title Pt will tolerate modalities for decreasing R elbow pain from 7/10 to 4/10 or less with activity.    Baseline Eval: R elbow pain 7/10 with activity    Time 8    Period Weeks    Status New    Target Date 02/22/21      OT LONG TERM GOAL #4   Title Pt will increase R grip strength by 10 or more lbs to enable increased tolerance for holding and carrying ADL supplies in R dominant arm.    Baseline Eval: R grip 35 lbs (L non dominant 64 lbs)    Time 8    Period Weeks    Status New    Target Date 02/22/21      OT LONG TERM GOAL #5   Title Pt will be 75% compliant with activity modifications/work space adaptations for joint protection and reducing risk of further cummulative trauma injury.    Baseline Eval: Pt reports desk at work is too high and causes too much elbow flexion while typing; activity modification not yet initiated at eval    Time 8    Period Weeks    Status New    Target Date 02/25/21                   Plan - 01/10/21 1554     Clinical Impression Statement Pt reports she was feeling a lot better with her pain until she tried to open a tight jar at home, which flared up her elbow pain.  Pt also reports throwing a lot of pottery over the weekend, which requires continuous pushing.  OT reinforced taking frequent rest breaks, wearing elbow strap, and reducing time spent with these activities which cause strain to elbow.  Pt will continue to benefit from skilled OT for modalities  to manage pain, cross tissue massage, HEP progression, and reinforcement of joint protection and ADL strategies.  OT Occupational Profile and History Problem Focused Assessment - Including review of records relating to presenting problem    Occupational performance deficits (Please refer to evaluation for details): ADL's;IADL's;Leisure;Work    Games developer / Function / Physical Skills ADL;UE functional use;Decreased knowledge of precautions;Body mechanics;Flexibility;IADL;Pain;Strength;Edema;ROM    Rehab Potential Good    Clinical Decision Making Limited treatment options, no task modification necessary    Comorbidities Affecting Occupational Performance: None    Modification or Assistance to Complete Evaluation  No modification of tasks or assist necessary to complete eval    OT Frequency 2x / week    OT Duration 8 weeks    OT Treatment/Interventions Self-care/ADL training;Cryotherapy;Therapeutic exercise;DME and/or AE instruction;Ultrasound;Manual Therapy;Splinting;Moist Heat;Iontophoresis;Passive range of motion;Therapeutic activities;Patient/family education;Electrical Stimulation    Plan OT for pain management, cross tissue massage, ROM/strengthening, instruction in joint protection strategies, and recommendations for work space modifications    OT Home Exercise Plan RICE protocol, elbow, wrist, FA stretching; will plan to issue putty    Consulted and Agree with Plan of Care Patient             Patient will benefit from skilled therapeutic intervention in order to improve the following deficits and impairments:   Body Structure / Function / Physical Skills: ADL, UE functional use, Decreased knowledge of precautions, Body mechanics, Flexibility, IADL, Pain, Strength, Edema, ROM       Visit Diagnosis: Lateral epicondylitis of right elbow  Muscle weakness (generalized)    Problem List Patient Active Problem List   Diagnosis Date Noted   Hyperlipidemia 05/17/2017    Perimenopausal 02/13/2016   Degenerative joint disease (DJD) of lumbar spine 11/29/2015   Osteopenia 11/29/2015   Vitamin D insufficiency 11/29/2015   Deafness in left ear    Anxiety 04/08/2013   Benign essential tremor 04/08/2013   Danelle Earthly, MS, OTR/L  Otis Dials, OT 01/11/2021, 3:54 PM  [place] [facility] MAIN [facility] SERVICES [address] [place], [place], [address] Phone: [phone]   Fax:  [phone]  Name: TOMARA YOUNGBERG MRN: [id number] Date of Birth: 1975-12-11

## 2021-01-12 ENCOUNTER — Ambulatory Visit: Payer: 59

## 2021-01-17 ENCOUNTER — Ambulatory Visit: Payer: 59

## 2021-01-19 ENCOUNTER — Other Ambulatory Visit: Payer: Self-pay

## 2021-01-19 ENCOUNTER — Ambulatory Visit: Payer: 59

## 2021-01-19 DIAGNOSIS — M7711 Lateral epicondylitis, right elbow: Secondary | ICD-10-CM | POA: Diagnosis not present

## 2021-01-19 DIAGNOSIS — M6281 Muscle weakness (generalized): Secondary | ICD-10-CM | POA: Diagnosis not present

## 2021-01-20 NOTE — Therapy (Signed)
[place] MAIN [facility] SERVICES [address] [place], [place], [address] Phone: [phone]   Fax:  [phone]  Occupational Therapy Treatment  Patient Details  Name: Carolyn [name] MRN: [id number] Date of Birth: 08-16-1975 Referring Provider (OT): Dr. Saralyn Pilar   Encounter Date: 01/19/2021   OT End of Session - 01/20/21 0800     Visit Number 5    Number of Visits 16    Date for OT Re-Evaluation 02/22/21    Authorization Time Period Reporting period beginning 12/29/20    OT Start Time 1645    OT Stop Time 1730    OT Time Calculation (min) 45 min    Activity Tolerance Patient tolerated treatment well    Behavior During Therapy [facility] for tasks assessed/performed             Past Medical History:  Diagnosis Date   Anemia    a. low-normal H/H   Anxiety    Arthritis    osteopenia   Benign essential tremor    a. bilateral upper and lower extremities; b. followed by [facility]; c. felt to be 2/2 antidepressants    Carpal tunnel syndrome    Deafness in left ear    Depression    Mixed incontinence    Palpitations     Past Surgical History:  Procedure Laterality Date   axillary lymph node removal Right 1982   enlarged lymph node   IUD REMOVAL N/A 04/23/2014   Procedure: INTRAUTERINE DEVICE (IUD) REMOVAL;  Surgeon: Harold Hedge, MD;  Location: [facility];  Service: Gynecology;  Laterality: N/A;   LAPAROSCOPIC TUBAL LIGATION Bilateral 04/23/2014   Procedure: LAPAROSCOPIC TUBAL LIGATION with Filshie Clips;  Surgeon: Harold Hedge, MD;  Location: [facility];  Service: Gynecology;  Laterality: Bilateral;    There were no vitals filed for this visit.   Subjective Assessment - 01/19/21 1717     Pertinent History R lateral epicondylitis, dominant arm    Limitations R elbow pain, dorsal forearm tenderness, weak grip    Patient Stated Goals Decrease pain.    Pain Onset More than a month ago            Occupational Therapy  Treatment: Iontophoresis: 8 min  2cc dexamethasone applied to R lateral elbow patch, 20 min tx, 40 mA/min, current 2.0   Manual Therapy: Cross tissue massage performed to R lateral elbow, upper arm, dorsal aspect of forearm, working to reduce pain and inflammation.  Performed passive stretching for R wrist flex/ext, radial deviation, and elbow flex/ext tolerating full range for all.    Therapeutic Exercise: Facilitated R grip strengthening with use of hand gripper set at moderate resistance with 1 green band to complete 3 sets 10 reps of hand squeezes; rest and wrist flex/ext stretch between sets.  Instructed pt in wrist flex/ext/radial deviation, forearm pron/sup, and elbow flex/ext with 2 lb weight and completed 1 sets 10 reps.  Reviewed HEP progression during Ionto tx time with instruction to keep reps and sets low, keep ROM arm within pain free range, and perform with rest and stretching breaks between exercises.  Reinforced benefits of continued frequent icing, use of elbow brace for more physical activities at work or home, and avoid throwing pottery until pain is gone.  Pt verbalized understanding of all education provided .    Ice pack applied to R elbow x5 min end of session for pain management.  Encouraged pt ice 20 min when she gets home.    Response  to Treatment: See Plan/clinical impression below.     OT Education - 01/19/21 0800     Education Details HEP progression    Person(s) Educated Patient    Methods Explanation;Verbal cues;Handout;Demonstration    Comprehension Verbalized understanding;Returned demonstration;Verbal cues required;Need further instruction              OT Short Term Goals - 12/30/20 1028       OT SHORT TERM GOAL #1   Title Pt will be indep to perform HEP including stretching and strengthening distal RUE.    Baseline Eval: Initiated self passive stretching for forearm extensors; need to issue putty    Time 4    Period Weeks    Status New     Target Date 01/25/21               OT Long Term Goals - 12/30/20 1030       OT LONG TERM GOAL #1   Title Pt will increase FOTO score to 80 or better to indicate increased functional performance.    Baseline Eval: FOTO 83    Time 8    Period Weeks    Status New    Target Date 02/22/21      OT LONG TERM GOAL #2   Title Pt will be 75% compliant with RICE protocol for reducing pain and inflammation in R elbow and forearm.    Baseline Eval: Pt not icing, but agreeable to start and receptive to order forearm/elbow strap for "rest and compression" for R elbow/forearm muscles.    Time 8    Period Weeks    Status New    Target Date 02/22/21      OT LONG TERM GOAL #3   Title Pt will tolerate modalities for decreasing R elbow pain from 7/10 to 4/10 or less with activity.    Baseline Eval: R elbow pain 7/10 with activity    Time 8    Period Weeks    Status New    Target Date 02/22/21      OT LONG TERM GOAL #4   Title Pt will increase R grip strength by 10 or more lbs to enable increased tolerance for holding and carrying ADL supplies in R dominant arm.    Baseline Eval: R grip 35 lbs (L non dominant 64 lbs)    Time 8    Period Weeks    Status New    Target Date 02/22/21      OT LONG TERM GOAL #5   Title Pt will be 75% compliant with activity modifications/work space adaptations for joint protection and reducing risk of further cummulative trauma injury.    Baseline Eval: Pt reports desk at work is too high and causes too much elbow flexion while typing; activity modification not yet initiated at eval    Time 8    Period Weeks    Status New    Target Date 02/25/21              Plan - 01/19/21 [id number]     Clinical Impression Statement Pt with good carry over for joint protection strategies, specifically stating that she has been driving her automatic car more vs her stick shift car, and when driving pt wears her elbow strap.  Pt states she has also avoided throwing pottery  to avoid any pain flare ups in R elbow.  Pt states she tried sitting on a pillow at her work chair d/t high desk where computer sits, but  then her feet were off the floor which caused discomfort in her back.  OT reinforced need to have feet supported and recommended foot stool.  Pt's pain at rest is 0, and pain with activity is 3, both improved from eval when pain at rest was 3-4 and 7-8 with activity.  Pt tolerated no more than 2 lb weight for strengthening RUE this day.  OT advised to use water bottle at home to keep resistance level low for strengthening and continue use of putty.   Pt will continue to benefit from skilled OT for modalities to manage pain, cross tissue massage, HEP progression, and reinforcement of joint protection and ADL strategies.    OT Occupational Profile and History Problem Focused Assessment - Including review of records relating to presenting problem    Occupational performance deficits (Please refer to evaluation for details): ADL's;IADL's;Leisure;Work    Games developer / Function / Physical Skills ADL;UE functional use;Decreased knowledge of precautions;Body mechanics;Flexibility;IADL;Pain;Strength;Edema;ROM    Rehab Potential Good    Clinical Decision Making Limited treatment options, no task modification necessary    Comorbidities Affecting Occupational Performance: None    Modification or Assistance to Complete Evaluation  No modification of tasks or assist necessary to complete eval    OT Frequency 2x / week    OT Duration 8 weeks    OT Treatment/Interventions Self-care/ADL training;Cryotherapy;Therapeutic exercise;DME and/or AE instruction;Ultrasound;Manual Therapy;Splinting;Moist Heat;Iontophoresis;Passive range of motion;Therapeutic activities;Patient/family education;Electrical Stimulation    Plan OT for pain management, cross tissue massage, ROM/strengthening, instruction in joint protection strategies, and recommendations for work space modifications    OT Home  Exercise Plan RICE protocol, elbow, wrist, FA stretching; will plan to issue putty    Consulted and Agree with Plan of Care Patient             Patient will benefit from skilled therapeutic intervention in order to improve the following deficits and impairments:   Body Structure / Function / Physical Skills: ADL, UE functional use, Decreased knowledge of precautions, Body mechanics, Flexibility, IADL, Pain, Strength, Edema, ROM       Visit Diagnosis: Muscle weakness (generalized)  Lateral epicondylitis of right elbow    Problem List Patient Active Problem List   Diagnosis Date Noted   Hyperlipidemia 05/17/2017   Perimenopausal 02/13/2016   Degenerative joint disease (DJD) of lumbar spine 11/29/2015   Osteopenia 11/29/2015   Vitamin D insufficiency 11/29/2015   Deafness in left ear    Anxiety 04/08/2013   Benign essential tremor 04/08/2013   Danelle Earthly, MS, OTR/L  Otis Dials, OT 01/20/2021, 8:23 AM  [place] [facility] MAIN [facility] SERVICES [address] [place], [place], [address] Phone: [phone]   Fax:  [phone]  Name: Carolyn [name] MRN: [id number] Date of Birth: [birth year]

## 2021-01-24 ENCOUNTER — Other Ambulatory Visit: Payer: Self-pay

## 2021-01-24 ENCOUNTER — Ambulatory Visit: Payer: 59

## 2021-01-24 DIAGNOSIS — M7711 Lateral epicondylitis, right elbow: Secondary | ICD-10-CM | POA: Diagnosis not present

## 2021-01-24 DIAGNOSIS — M6281 Muscle weakness (generalized): Secondary | ICD-10-CM

## 2021-01-25 NOTE — Therapy (Signed)
[place] MAIN [facility] SERVICES [address] [place], [place], [address] Phone: [phone]   Fax:  [phone]  Occupational Therapy Treatment  Patient Details  Name: Carolyn [name] MRN: [id number] Date of Birth: 1975/10/13 Referring Provider (OT): Dr. Nobie Putnam   Encounter Date: 01/24/2021   OT End of Session - 01/25/21 1008     Visit Number 6    Number of Visits 16    Date for OT Re-Evaluation 02/22/21    Authorization Time Period Reporting period beginning 12/29/20    OT Start Time 1640    OT Stop Time 1730    OT Time Calculation (min) 50 min    Activity Tolerance Patient tolerated treatment well    Behavior During Therapy [facility] for tasks assessed/performed             Past Medical History:  Diagnosis Date   Anemia    a. low-normal H/H   Anxiety    Arthritis    osteopenia   Benign essential tremor    a. bilateral upper and lower extremities; b. followed by [facility]; c. felt to be 2/2 antidepressants    Carpal tunnel syndrome    Deafness in left ear    Depression    Mixed incontinence    Palpitations     Past Surgical History:  Procedure Laterality Date   axillary lymph node removal Right 1982   enlarged lymph node   IUD REMOVAL N/A 04/23/2014   Procedure: INTRAUTERINE DEVICE (IUD) REMOVAL;  Surgeon: Everlene Farrier, MD;  Location: [place] [facility];  Service: Gynecology;  Laterality: N/A;   LAPAROSCOPIC TUBAL LIGATION Bilateral 04/23/2014   Procedure: LAPAROSCOPIC TUBAL LIGATION with Filshie Clips;  Surgeon: Everlene Farrier, MD;  Location: [place] [facility];  Service: Gynecology;  Laterality: Bilateral;    There were no vitals filed for this visit.   Subjective Assessment - 01/24/21 1004     Subjective  "It's feeling a lot better."    Pertinent History R lateral epicondylitis, dominant arm    Limitations R elbow pain, dorsal forearm tenderness, weak grip    Patient Stated Goals Decrease pain.    Currently in Pain? Yes    Pain Score 2      Pain Location Elbow    Pain Orientation Right    Pain Descriptors / Indicators Aching;Tightness;Tender    Pain Type Chronic pain    Pain Radiating Towards forearm and upper arm    Pain Onset More than a month ago    Pain Frequency Intermittent    Aggravating Factors  gripping, elbow ROM, lifting, pushing    Pain Relieving Factors rest, heat/ice, use of elbow brace    Effect of Pain on Daily Activities pain with gripping, lifting, pushing    Multiple Pain Sites No            Occupational Therapy Treatment: Ice pack applied to R elbow x5 min for pain management in prep for therapeutic exercise.    Iontophoresis: 8 min  2cc dexamethasone applied to R lateral elbow patch, 20 min tx, 40 mA/min, current 2.0  Self Care: Educated on workspace modifications and provided handout with instructions on recommended distances for computer monitor to face, angles for hips/knees/elbows when typing.  Recommended having occupational health ergonomics eval for pt's workspace.  OT researching who to contact for this request, and pt receptive.   Manual Therapy: Cross tissue massage performed to R lateral elbow, upper arm, dorsal aspect of forearm, working to reduce pain and inflammation.  Performed passive stretching for R wrist flex/ext, and elbow flex/ext tolerating full range for all.     Therapeutic Exercise: Facilitated R grip strengthening with use of hand gripper set at moderate resistance with 1 green band to complete 3 sets 10 reps of hand squeezes; rest between sets.  Instructed pt in wrist flex/ext/radial deviation, forearm pron/sup, and elbow flex/ext with 2 lb weight and completed 2 sets 10 reps each.      Response to Treatment: Pt reports she continues to avoid throwing pottery and driving her stick shift car as she knows these activities aggravate her elbow.  Pt reports improvement in pain with 0 at rest and 2/10 with activity.  Pt receptive to workspace ergonomics eval and OT will  search for correct contact.  Pt has had 4 total iontophoresis treatments with dexamethasone with good tolerance.  Planning 6 ionto treatments total.  Remaining visits will continue to focus on reducing pain, reinforcing joint protection strategies, ADL modification as needed, and HEP progression for RUE strengthening.     OT Education - 01/25/21 1007     Education Details Ergonomic workspace recommendations    Person(s) Educated Patient    Methods Explanation;Verbal cues;Demonstration;Handout    Comprehension Verbalized understanding;Returned demonstration;Verbal cues required;Need further instruction              OT Short Term Goals - 12/30/20 1028       OT SHORT TERM GOAL #1   Title Pt will be indep to perform HEP including stretching and strengthening distal RUE.    Baseline Eval: Initiated self passive stretching for forearm extensors; need to issue putty    Time 4    Period Weeks    Status New    Target Date 01/25/21               OT Long Term Goals - 12/30/20 1030       OT LONG TERM GOAL #1   Title Pt will increase FOTO score to 80 or better to indicate increased functional performance.    Baseline Eval: FOTO 83    Time 8    Period Weeks    Status New    Target Date 02/22/21      OT LONG TERM GOAL #2   Title Pt will be 75% compliant with RICE protocol for reducing pain and inflammation in R elbow and forearm.    Baseline Eval: Pt not icing, but agreeable to start and receptive to order forearm/elbow strap for "rest and compression" for R elbow/forearm muscles.    Time 8    Period Weeks    Status New    Target Date 02/22/21      OT LONG TERM GOAL #3   Title Pt will tolerate modalities for decreasing R elbow pain from 7/10 to 4/10 or less with activity.    Baseline Eval: R elbow pain 7/10 with activity    Time 8    Period Weeks    Status New    Target Date 02/22/21      OT LONG TERM GOAL #4   Title Pt will increase R grip strength by 10 or more lbs to  enable increased tolerance for holding and carrying ADL supplies in R dominant arm.    Baseline Eval: R grip 35 lbs (L non dominant 64 lbs)    Time 8    Period Weeks    Status New    Target Date 02/22/21      OT LONG TERM GOAL #  5   Title Pt will be 75% compliant with activity modifications/work space adaptations for joint protection and reducing risk of further cummulative trauma injury.    Baseline Eval: Pt reports desk at work is too high and causes too much elbow flexion while typing; activity modification not yet initiated at eval    Time 8    Period Weeks    Status New    Target Date 02/25/21             Plan - 01/24/21 1021     Clinical Impression Statement Pt reports she continues to avoid throwing pottery and driving her stick shift car as she knows these activities aggravate her elbow.  Pt reports improvement in pain with 0 at rest and 2/10 with activity.  Pt receptive to workspace ergonomics eval and OT will search for correct contact.  Pt has had 4 total iontophoresis treatments with dexamethasone with good tolerance.  Planning 6 ionto treatments total.  Remaining visits will continue to focus on reducing pain, reinforcing joint protection strategies, ADL modification as needed, and HEP progression for RUE strengthening.    OT Occupational Profile and History Problem Focused Assessment - Including review of records relating to presenting problem    Occupational performance deficits (Please refer to evaluation for details): ADL's;IADL's;Leisure;Work    Marketing executive / Function / Physical Skills ADL;UE functional use;Decreased knowledge of precautions;Body mechanics;Flexibility;IADL;Pain;Strength;Edema;ROM    Rehab Potential Good    Clinical Decision Making Limited treatment options, no task modification necessary    Comorbidities Affecting Occupational Performance: None    Modification or Assistance to Complete Evaluation  No modification of tasks or assist necessary to  complete eval    OT Frequency 2x / week    OT Duration 8 weeks    OT Treatment/Interventions Self-care/ADL training;Cryotherapy;Therapeutic exercise;DME and/or AE instruction;Ultrasound;Manual Therapy;Splinting;Moist Heat;Iontophoresis;Passive range of motion;Therapeutic activities;Patient/family education;Electrical Stimulation    Plan OT for pain management, cross tissue massage, ROM/strengthening, instruction in joint protection strategies, and recommendations for work space modifications    OT Home Exercise Plan RICE protocol, elbow, wrist, FA stretching; will plan to issue putty    Consulted and Agree with Plan of Care Patient             Patient will benefit from skilled therapeutic intervention in order to improve the following deficits and impairments:   Body Structure / Function / Physical Skills: ADL, UE functional use, Decreased knowledge of precautions, Body mechanics, Flexibility, IADL, Pain, Strength, Edema, ROM       Visit Diagnosis: Muscle weakness (generalized)  Lateral epicondylitis of right elbow    Problem List Patient Active Problem List   Diagnosis Date Noted   Hyperlipidemia 05/17/2017   Perimenopausal 02/13/2016   Degenerative joint disease (DJD) of lumbar spine 11/29/2015   Osteopenia 11/29/2015   Vitamin D insufficiency 11/29/2015   Deafness in left ear    Anxiety 04/08/2013   Benign essential tremor 04/08/2013   Leta Speller, MS, OTR/L  Darleene Cleaver, OT 01/25/2021, 10:21 AM  [place] MAIN [facility] SERVICES [address] [place], [place], [address] Phone: [phone]   Fax:  [phone]  Name: FARRELL STORMENT MRN: [id number] Date of Birth: 1975-05-08

## 2021-01-26 ENCOUNTER — Ambulatory Visit: Payer: 59

## 2021-01-26 ENCOUNTER — Other Ambulatory Visit: Payer: Self-pay

## 2021-01-26 DIAGNOSIS — M7711 Lateral epicondylitis, right elbow: Secondary | ICD-10-CM | POA: Diagnosis not present

## 2021-01-26 DIAGNOSIS — M6281 Muscle weakness (generalized): Secondary | ICD-10-CM | POA: Diagnosis not present

## 2021-01-27 NOTE — Therapy (Signed)
[place] MAIN [facility] SERVICES [address] [place], [place], [address] Phone: [phone]   Fax:  [phone]  Occupational Therapy Treatment  Patient Details  Name: Carolyn [name] MRN: [id number] Date of Birth: 11/21/1975 Referring Provider (OT): Dr. Nobie Putnam   Encounter Date: 01/26/2021   OT End of Session - 01/27/21 1154     Visit Number 7    Number of Visits 16    Date for OT Re-Evaluation 02/22/21    Authorization Time Period Reporting period beginning 12/29/20    OT Start Time 1647    OT Stop Time 1723    OT Time Calculation (min) 36 min    Activity Tolerance Patient tolerated treatment well    Behavior During Therapy [facility] for tasks assessed/performed             Past Medical History:  Diagnosis Date   Anemia    a. low-normal H/H   Anxiety    Arthritis    osteopenia   Benign essential tremor    a. bilateral upper and lower extremities; b. followed by [facility]; c. felt to be 2/2 antidepressants    Carpal tunnel syndrome    Deafness in left ear    Depression    Mixed incontinence    Palpitations     Past Surgical History:  Procedure Laterality Date   axillary lymph node removal Right 1982   enlarged lymph node   IUD REMOVAL N/A 04/23/2014   Procedure: INTRAUTERINE DEVICE (IUD) REMOVAL;  Surgeon: Everlene Farrier, MD;  Location: [place] [facility];  Service: Gynecology;  Laterality: N/A;   LAPAROSCOPIC TUBAL LIGATION Bilateral 04/23/2014   Procedure: LAPAROSCOPIC TUBAL LIGATION with Filshie Clips;  Surgeon: Everlene Farrier, MD;  Location: [place] [facility];  Service: Gynecology;  Laterality: Bilateral;    There were no vitals filed for this visit.   Subjective Assessment - 01/26/21 1701     Subjective  "It's more like an ache now than a pain."    Pertinent History R lateral epicondylitis, dominant arm    Limitations R elbow pain, dorsal forearm tenderness, weak grip    Patient Stated Goals Decrease pain.    Currently in Pain? Yes     Pain Score 2     Pain Location Elbow    Pain Orientation Right    Pain Descriptors / Indicators Aching    Pain Type Chronic pain    Pain Radiating Towards forearm and upper arm    Pain Onset More than a month ago    Pain Frequency Intermittent    Aggravating Factors  gripping, elbow ROM, lifting, pushing    Pain Relieving Factors rest, heat/ice, use of elbow brace    Effect of Pain on Daily Activities pain with gripping, lifting, pushing    Multiple Pain Sites No           Occupational Therapy Treatment: Iontophoresis: 8 min  2cc dexamethasone applied to R lateral elbow patch, 20 min tx, 40 mA/min, current 2.0   Manual Therapy: Cross tissue massage performed to R lateral elbow, upper arm, dorsal aspect of forearm, working to reduce pain and inflammation.  Performed passive stretching for R wrist flex/ext, and elbow flex/ext tolerating full range for all.     Therapeutic Exercise: Facilitated R grip strengthening with use of hand gripper set at moderate resistance with 1 green band to complete 3 sets 10 reps of hand squeezes; rest between sets.  Instructed pt in wrist flex/ext/radial deviation, forearm pron/sup, and elbow flex/ext  with 2 lb weight and completed 2 sets 10 reps each with good tolerance.     Response to Treatment: Pt has had 5 total iontophoresis treatments with dexamethasone with good tolerance.  Planning 6-7 ionto treatments total.  Remaining visits will continue to focus on reducing pain, reinforcing joint protection strategies, ADL modification as needed, and HEP progression for RUE strengthening.       OT Education - 01/26/21 1705     Education Details Ergonomic workspace recommendations    Person(s) Educated Patient    Methods Explanation;Verbal cues;Demonstration;Handout    Comprehension Verbalized understanding;Returned demonstration;Verbal cues required;Need further instruction              OT Short Term Goals - 12/30/20 1028       OT SHORT TERM  GOAL #1   Title Pt will be indep to perform HEP including stretching and strengthening distal RUE.    Baseline Eval: Initiated self passive stretching for forearm extensors; need to issue putty    Time 4    Period Weeks    Status New    Target Date 01/25/21               OT Long Term Goals - 12/30/20 1030       OT LONG TERM GOAL #1   Title Pt will increase FOTO score to 80 or better to indicate increased functional performance.    Baseline Eval: FOTO 83    Time 8    Period Weeks    Status New    Target Date 02/22/21      OT LONG TERM GOAL #2   Title Pt will be 75% compliant with RICE protocol for reducing pain and inflammation in R elbow and forearm.    Baseline Eval: Pt not icing, but agreeable to start and receptive to order forearm/elbow strap for "rest and compression" for R elbow/forearm muscles.    Time 8    Period Weeks    Status New    Target Date 02/22/21      OT LONG TERM GOAL #3   Title Pt will tolerate modalities for decreasing R elbow pain from 7/10 to 4/10 or less with activity.    Baseline Eval: R elbow pain 7/10 with activity    Time 8    Period Weeks    Status New    Target Date 02/22/21      OT LONG TERM GOAL #4   Title Pt will increase R grip strength by 10 or more lbs to enable increased tolerance for holding and carrying ADL supplies in R dominant arm.    Baseline Eval: R grip 35 lbs (L non dominant 64 lbs)    Time 8    Period Weeks    Status New    Target Date 02/22/21      OT LONG TERM GOAL #5   Title Pt will be 75% compliant with activity modifications/work space adaptations for joint protection and reducing risk of further cummulative trauma injury.    Baseline Eval: Pt reports desk at work is too high and causes too much elbow flexion while typing; activity modification not yet initiated at eval    Time 8    Period Weeks    Status New    Target Date 02/25/21               Plan - 01/26/21 1207     Clinical Impression  Statement Pt has had 5 total iontophoresis treatments with dexamethasone with  good tolerance.  Planning 6-7 ionto treatments total.  Remaining visits will continue to focus on reducing pain, reinforcing joint protection strategies, ADL modification as needed, and HEP progression for RUE strengthening.    OT Occupational Profile and History Problem Focused Assessment - Including review of records relating to presenting problem    Occupational performance deficits (Please refer to evaluation for details): ADL's;IADL's;Leisure;Work    Marketing executive / Function / Physical Skills ADL;UE functional use;Decreased knowledge of precautions;Body mechanics;Flexibility;IADL;Pain;Strength;Edema;ROM    Rehab Potential Good    Clinical Decision Making Limited treatment options, no task modification necessary    Comorbidities Affecting Occupational Performance: None    Modification or Assistance to Complete Evaluation  No modification of tasks or assist necessary to complete eval    OT Frequency 2x / week    OT Duration 8 weeks    OT Treatment/Interventions Self-care/ADL training;Cryotherapy;Therapeutic exercise;DME and/or AE instruction;Ultrasound;Manual Therapy;Splinting;Moist Heat;Iontophoresis;Passive range of motion;Therapeutic activities;Patient/family education;Electrical Stimulation    Plan OT for pain management, cross tissue massage, ROM/strengthening, instruction in joint protection strategies, and recommendations for work space modifications    OT Home Exercise Plan RICE protocol, elbow, wrist, FA stretching; will plan to issue putty    Consulted and Agree with Plan of Care Patient             Patient will benefit from skilled therapeutic intervention in order to improve the following deficits and impairments:   Body Structure / Function / Physical Skills: ADL, UE functional use, Decreased knowledge of precautions, Body mechanics, Flexibility, IADL, Pain, Strength, Edema, ROM       Visit  Diagnosis: Lateral epicondylitis of right elbow  Muscle weakness (generalized)    Problem List Patient Active Problem List   Diagnosis Date Noted   Hyperlipidemia 05/17/2017   Perimenopausal 02/13/2016   Degenerative joint disease (DJD) of lumbar spine 11/29/2015   Osteopenia 11/29/2015   Vitamin D insufficiency 11/29/2015   Deafness in left ear    Anxiety 04/08/2013   Benign essential tremor 04/08/2013   Leta Speller, MS, OTR/L  Darleene Cleaver, OT 01/27/2021, 12:07 PM  [place] MAIN [facility] SERVICES [address] [place], [place], [address] Phone: [phone]   Fax:  [phone]  Name: Carolyn [name] MRN: [id number] Date of Birth: 09-Sep-1975

## 2021-01-28 ENCOUNTER — Other Ambulatory Visit (HOSPITAL_COMMUNITY): Payer: Self-pay

## 2021-01-31 ENCOUNTER — Ambulatory Visit: Payer: 59

## 2021-01-31 ENCOUNTER — Other Ambulatory Visit: Payer: Self-pay

## 2021-01-31 DIAGNOSIS — M7711 Lateral epicondylitis, right elbow: Secondary | ICD-10-CM | POA: Diagnosis not present

## 2021-01-31 DIAGNOSIS — M6281 Muscle weakness (generalized): Secondary | ICD-10-CM | POA: Diagnosis not present

## 2021-02-01 NOTE — Therapy (Signed)
[place] MAIN [facility] SERVICES [address] [place], [place], [address] Phone: [phone]   Fax:  [phone]  Occupational Therapy Treatment  Patient Details  Name: Carolyn [name] MRN: [id number] Date of Birth: [birth year] Referring Provider (OT): Dr. Nobie Putnam   Encounter Date: 01/31/2021   OT End of Session - 01/31/21 1708     Visit Number 8    Number of Visits 16    Date for OT Re-Evaluation 02/22/21    Authorization Time Period Reporting period beginning 12/29/20    OT Start Time 1647    OT Stop Time 1725    OT Time Calculation (min) 38 min    Activity Tolerance Patient tolerated treatment well    Behavior During Therapy [facility] for tasks assessed/performed             Past Medical History:  Diagnosis Date   Anemia    a. low-normal H/H   Anxiety    Arthritis    osteopenia   Benign essential tremor    a. bilateral upper and lower extremities; b. followed by [facility]; c. felt to be 2/2 antidepressants    Carpal tunnel syndrome    Deafness in left ear    Depression    Mixed incontinence    Palpitations     Past Surgical History:  Procedure Laterality Date   axillary lymph node removal Right 1982   enlarged lymph node   IUD REMOVAL N/A 04/23/2014   Procedure: INTRAUTERINE DEVICE (IUD) REMOVAL;  Surgeon: Everlene Farrier, MD;  Location: [place] [facility];  Service: Gynecology;  Laterality: N/A;   LAPAROSCOPIC TUBAL LIGATION Bilateral 04/23/2014   Procedure: LAPAROSCOPIC TUBAL LIGATION with Filshie Clips;  Surgeon: Everlene Farrier, MD;  Location: [place] [facility];  Service: Gynecology;  Laterality: Bilateral;    There were no vitals filed for this visit.   Subjective Assessment - 01/31/21 1706     Subjective  "My forearm started hurting more over the weekend, but my elbow feels fine."    Pertinent History R lateral epicondylitis, dominant arm    Limitations R elbow pain, dorsal forearm tenderness, weak grip    Patient Stated Goals Decrease  pain.    Currently in Pain? Yes    Pain Score 3     Pain Location Elbow    Pain Orientation Right    Pain Descriptors / Indicators Aching    Pain Type Chronic pain    Pain Radiating Towards forearm    Pain Onset More than a month ago    Pain Frequency Constant    Aggravating Factors  gripping, elbow ROM, lifting, pushing    Pain Relieving Factors rest, heat/ice, use of elbow brace    Effect of Pain on Daily Activities pain with gripping, lifting, pushing    Multiple Pain Sites No            Occupational Therapy Treatment: Iontophoresis: 8 min  2cc dexamethasone applied to R lateral elbow patch, 20 min tx, 40 mA/min, current 2.0 for pain and inflammation.   Manual Therapy: Cross tissue massage performed to R lateral elbow, upper arm, dorsal aspect of forearm, working to reduce pain and inflammation.  Performed passive prolonged stretching for R wrist flex/ext, and elbow flex/ext tolerating full range for all.     Therapeutic Exercise: Reviewed self passive R elbow and wrist stretching with good return demo.  Performed gentle AROM for R elbow and wrist flex/ext.  Pt reports pain mid range for elbow flex/ext, but improves with  reps.  Instructed pt to avoid resistive exercises at home until return to OT on Thurs this week d/t increased muscle soreness in R dorsal forearm.  Encouraged icing, rest, and frequent stretching.  Able to return demo of stretch for extended elbow with wrist flex with 20 sec hold.    Response to Treatment: See Plan/clinical impression below.     OT Education - 01/31/21 1708     Education Details assisted pt to navigate request for ergonomic workspace evaluation through [facility]) Educated Patient    Methods Explanation;Verbal cues;Demonstration    Comprehension Verbalized understanding;Verbal cues required              OT Short Term Goals - 12/30/20 1028       OT SHORT TERM GOAL #1   Title Pt will be indep to perform HEP including  stretching and strengthening distal RUE.    Baseline Eval: Initiated self passive stretching for forearm extensors; need to issue putty    Time 4    Period Weeks    Status New    Target Date 01/25/21               OT Long Term Goals - 12/30/20 1030       OT LONG TERM GOAL #1   Title Pt will increase FOTO score to 80 or better to indicate increased functional performance.    Baseline Eval: FOTO 83    Time 8    Period Weeks    Status New    Target Date 02/22/21      OT LONG TERM GOAL #2   Title Pt will be 75% compliant with RICE protocol for reducing pain and inflammation in R elbow and forearm.    Baseline Eval: Pt not icing, but agreeable to start and receptive to order forearm/elbow strap for "rest and compression" for R elbow/forearm muscles.    Time 8    Period Weeks    Status New    Target Date 02/22/21      OT LONG TERM GOAL #3   Title Pt will tolerate modalities for decreasing R elbow pain from 7/10 to 4/10 or less with activity.    Baseline Eval: R elbow pain 7/10 with activity    Time 8    Period Weeks    Status New    Target Date 02/22/21      OT LONG TERM GOAL #4   Title Pt will increase R grip strength by 10 or more lbs to enable increased tolerance for holding and carrying ADL supplies in R dominant arm.    Baseline Eval: R grip 35 lbs (L non dominant 64 lbs)    Time 8    Period Weeks    Status New    Target Date 02/22/21      OT LONG TERM GOAL #5   Title Pt will be 75% compliant with activity modifications/work space adaptations for joint protection and reducing risk of further cummulative trauma injury.    Baseline Eval: Pt reports desk at work is too high and causes too much elbow flexion while typing; activity modification not yet initiated at eval    Time 8    Period Weeks    Status New    Target Date 02/25/21               Plan - 01/31/21 1120     Clinical Impression Statement Pt reports R elbow no longer tender, but pt has had  an  increase in dorsal forearm extensor pain.  Instructed pt to avoid resistive exercises at home until return to OT on Thurs this week d/t increased muscle soreness in R forearm.  Encouraged continued icing, rest, and frequent stretching.  Able to return demo of stretch for extended elbow with wrist flex with 20 sec hold.  Pt started the process to request ergonomic workspace eval.  Anticipate readiness for d/c next visit.    OT Occupational Profile and History Problem Focused Assessment - Including review of records relating to presenting problem    Occupational performance deficits (Please refer to evaluation for details): ADL's;IADL's;Leisure;Work    Marketing executive / Function / Physical Skills ADL;UE functional use;Decreased knowledge of precautions;Body mechanics;Flexibility;IADL;Pain;Strength;Edema;ROM    Rehab Potential Good    Clinical Decision Making Limited treatment options, no task modification necessary    Comorbidities Affecting Occupational Performance: None    Modification or Assistance to Complete Evaluation  No modification of tasks or assist necessary to complete eval    OT Frequency 2x / week    OT Duration 8 weeks    OT Treatment/Interventions Self-care/ADL training;Cryotherapy;Therapeutic exercise;DME and/or AE instruction;Ultrasound;Manual Therapy;Splinting;Moist Heat;Iontophoresis;Passive range of motion;Therapeutic activities;Patient/family education;Electrical Stimulation    Plan OT for pain management, cross tissue massage, ROM/strengthening, instruction in joint protection strategies, and recommendations for work space modifications    OT Home Exercise Plan RICE protocol, elbow, wrist, FA stretching; will plan to issue putty    Consulted and Agree with Plan of Care Patient             Patient will benefit from skilled therapeutic intervention in order to improve the following deficits and impairments:   Body Structure / Function / Physical Skills: ADL, UE functional use,  Decreased knowledge of precautions, Body mechanics, Flexibility, IADL, Pain, Strength, Edema, ROM       Visit Diagnosis: Lateral epicondylitis of right elbow  Muscle weakness (generalized)    Problem List Patient Active Problem List   Diagnosis Date Noted   Hyperlipidemia 05/17/2017   Perimenopausal 02/13/2016   Degenerative joint disease (DJD) of lumbar spine 11/29/2015   Osteopenia 11/29/2015   Vitamin D insufficiency 11/29/2015   Deafness in left ear    Anxiety 04/08/2013   Benign essential tremor 04/08/2013   Leta Speller, MS, OTR/L  Darleene Cleaver, OT 02/01/2021, 11:20 AM  [place] [address] [place], [place], [address] Phone: [phone]   Fax:  [phone]  Name: Carolyn [name] MRN: [id number] Date of Birth: 1975/03/14

## 2021-02-02 ENCOUNTER — Ambulatory Visit: Payer: 59

## 2021-02-02 DIAGNOSIS — M7711 Lateral epicondylitis, right elbow: Secondary | ICD-10-CM

## 2021-02-02 DIAGNOSIS — M6281 Muscle weakness (generalized): Secondary | ICD-10-CM

## 2021-02-02 NOTE — Therapy (Signed)
[place] MAIN [facility] SERVICES [address] [place], [place], [address] Phone: [phone]   Fax:  [phone]  Occupational Therapy Discharge Visit  Patient Details  Name: Carolyn [name] MRN: [id number] Date of Birth: May 06, 1975 Referring Provider (OT): Dr. Nobie Putnam   Encounter Date: 02/02/2021   OT End of Session - 02/02/21 1835     Visit Number 9    Number of Visits 16    Date for OT Re-Evaluation 02/22/21    Authorization Time Period Reporting period beginning 12/29/20    OT Start Time 1645    OT Stop Time 1715    OT Time Calculation (min) 30 min    Activity Tolerance Patient tolerated treatment well    Behavior During Therapy [facility] for tasks assessed/performed             Past Medical History:  Diagnosis Date   Anemia    a. low-normal H/H   Anxiety    Arthritis    osteopenia   Benign essential tremor    a. bilateral upper and lower extremities; b. followed by [facility]; c. felt to be 2/2 antidepressants    Carpal tunnel syndrome    Deafness in left ear    Depression    Mixed incontinence    Palpitations     Past Surgical History:  Procedure Laterality Date   axillary lymph node removal Right 1982   enlarged lymph node   IUD REMOVAL N/A 04/23/2014   Procedure: INTRAUTERINE DEVICE (IUD) REMOVAL;  Surgeon: Everlene Farrier, MD;  Location: [facility];  Service: Gynecology;  Laterality: N/A;   LAPAROSCOPIC TUBAL LIGATION Bilateral 04/23/2014   Procedure: LAPAROSCOPIC TUBAL LIGATION with Filshie Clips;  Surgeon: Everlene Farrier, MD;  Location: [place] [facility];  Service: Gynecology;  Laterality: Bilateral;    There were no vitals filed for this visit.   Subjective Assessment - 02/02/21 1830     Subjective  "My forearm is feeling a lot better today.  I rested it."    Pertinent History R lateral epicondylitis, dominant arm    Limitations R elbow pain, dorsal forearm tenderness, weak grip    Patient Stated Goals Decrease pain.     Currently in Pain? Yes    Pain Score 2     Pain Location Arm    Pain Orientation Right    Pain Descriptors / Indicators Aching;Sore    Pain Type Chronic pain    Pain Radiating Towards forearm    Pain Onset More than a month ago    Pain Frequency Intermittent    Aggravating Factors  activity    Pain Relieving Factors rest, heat/ice, use of elbow brace    Effect of Pain on Daily Activities pain with gripping, lifting, pushing    Multiple Pain Sites No                [facility] OT Assessment - 02/02/21 0001       Assessment   Medical Diagnosis R lateral epicondylitis    Referring Provider (OT) Dr. Nobie Putnam    Onset Date/Surgical Date 09/08/20   pt estimates pain ~ 3 months   Hand Dominance Right      ADL   ADL comments Indep with ADLs but now with minimal pain in R elbow/forearm with activity. (Severe pain at eval)      AROM   Overall AROM Comments 0 degrees/full extension      Strength   Overall Strength Comments R elbow flex 4+, extension 4,  R wrist flex/ext 4/5 (slight pain in forearm)      Hand Function   Right Hand Grip (lbs) 55    Right Hand Lateral Pinch 13 lbs    Right Hand 3 Point Pinch 16 lbs    Left Hand Grip (lbs) 55    Left Hand Lateral Pinch 13 lbs    Left 3 point pinch 15 lbs            Occupational Therapy Treatment/Discharge visit: Iontophoresis: 8 min  2cc dexamethasone applied to R forearm extensor patch, 20 min tx, 40 mA/min, current 2.0 for pain and inflammation.  Patch has been placed routinely on R lateral elbow but moved to forearm extensors today d/t pain more prominent at this area over the last week.  Pt tolerated well.    Therapeutic Exercise: Measurements taken for RUE ROM/MMT and goals updated; see note for details.  HEP reviewed, RICE protocol reviewed; pt indep with all.   Response to Treatment: Pt seen by OT for 9 OT tx sessions to address RUE weakness and pain related to R lateral epicondylitis.  FOTO score has  improved from 76 to 81 at discharge.  All strength measurements have improved from elbow down to wrist and hand.  Pt had 7 txs of iontophoresis with Dex with good tolerance.  Pain has improved from severe to minimal (0 at rest, 2-3 with activity).  Pt has requested an ergonomics workspace evaluation through her employer per OT recommendation, and pt will be filling out the pre-assessment forms to get this scheduled.  Pt is indep with HEP, rice protocol, activity modifications, and joint protection strategies.  All OT goals met.  Pt in agreement with readiness for discharge.      OT Education - 02/02/21 1834     Education Details HEP review    Person(s) Educated Patient    Methods Explanation;Verbal cues    Comprehension Verbalized understanding;Returned demonstration              OT Short Term Goals - 02/02/21 1654       OT SHORT TERM GOAL #1   Title Pt will be indep to perform HEP including stretching and strengthening distal RUE.    Baseline Eval: Initiated self passive stretching for forearm extensors; need to issue putty; 02/02/21 d/c: indep with all exercises and consistent to perform HEP.    Time 4    Period Weeks    Status Achieved    Target Date 01/25/21               OT Long Term Goals - 02/02/21 1656       OT LONG TERM GOAL #1   Title Pt will increase FOTO score to 80 or better to indicate increased functional performance.    Baseline Eval: 76; 02/02/21 d/c: 81    Time 8    Period Weeks    Status Achieved    Target Date 02/22/21      OT LONG TERM GOAL #2   Title Pt will be 75% compliant with RICE protocol for reducing pain and inflammation in R elbow and forearm.    Baseline Eval: Pt not icing, but agreeable to start and receptive to order forearm/elbow strap for "rest and compression" for R elbow/forearm muscles; 02/02/21 d/c: indep and consistent with RICE protocol.    Time 8    Period Weeks    Status Achieved    Target Date 02/22/21      OT LONG TERM  GOAL #  3   Title Pt will tolerate modalities for decreasing R elbow pain from 7/10 to 4/10 or less with activity.    Baseline Eval: R elbow pain 7/10 with activity; 02/02/21 d/c: pain consistently 2-3/10 with activity, 0 at rest    Time 8    Period Weeks    Status Achieved    Target Date 02/22/21      OT LONG TERM GOAL #4   Title Pt will increase R grip strength by 10 or more lbs to enable increased tolerance for holding and carrying ADL supplies in R dominant arm.    Baseline Eval: R grip 35 lbs (L non dominant 64 lbs); 02/02/21 d/c: R grip 55 lbs    Time 8    Period Weeks    Status Achieved    Target Date 02/22/21      OT LONG TERM GOAL #5   Title Pt will be 75% compliant with activity modifications/work space adaptations for joint protection and reducing risk of further cummulative trauma injury.    Baseline Eval: Pt reports desk at work is too high and causes too much elbow flexion while typing; activity modification not yet initiated at eval; 02/02/21: Pt has requested ergonomic workspace evaluation through employer; pt is indep with joint protection and activity modifications.    Time 8    Status Achieved    Target Date 02/25/21              Plan - 02/02/21 1848     Clinical Impression Statement Pt seen by OT for 9 OT tx sessions to address RUE weakness and pain related to R lateral epicondylitis.  FOTO score has improved from 76 to 81 at discharge.  All strength measurements have improved from elbow down to wrist and hand.  Pt had 7 txs of iontophoresis with Dex with good tolerance.  Pain has improved from severe to minimal (0 at rest, 2-3 with activity).  Pt has requested an ergonomics workspace evaluation through her employer per OT recommendation, and pt will be filling out the pre-assessment forms to get this scheduled.  Pt is indep with HEP, rice protocol, activity modifications, and joint protection strategies.  All OT goals met.  Pt in agreement with readiness for discharge.     OT Occupational Profile and History Problem Focused Assessment - Including review of records relating to presenting problem    Occupational performance deficits (Please refer to evaluation for details): ADL's;IADL's;Leisure;Work    Marketing executive / Function / Physical Skills ADL;UE functional use;Decreased knowledge of precautions;Body mechanics;Flexibility;IADL;Pain;Strength;Edema;ROM    Rehab Potential Good    Clinical Decision Making Limited treatment options, no task modification necessary    Comorbidities Affecting Occupational Performance: None    Modification or Assistance to Complete Evaluation  No modification of tasks or assist necessary to complete eval    OT Frequency 2x / week    OT Duration 8 weeks    OT Treatment/Interventions Self-care/ADL training;Cryotherapy;Therapeutic exercise;DME and/or AE instruction;Ultrasound;Manual Therapy;Splinting;Moist Heat;Iontophoresis;Passive range of motion;Therapeutic activities;Patient/family education;Electrical Stimulation    Plan OT for pain management, cross tissue massage, ROM/strengthening, instruction in joint protection strategies, and recommendations for work space modifications    OT Home Exercise Plan RICE protocol, elbow, wrist, FA stretching; will plan to issue putty    Consulted and Agree with Plan of Care Patient             Patient will benefit from skilled therapeutic intervention in order to improve the following deficits and impairments:  Body Structure / Function / Physical Skills: ADL, UE functional use, Decreased knowledge of precautions, Body mechanics, Flexibility, IADL, Pain, Strength, Edema, ROM       Visit Diagnosis: Muscle weakness (generalized)  Lateral epicondylitis of right elbow    Problem List Patient Active Problem List   Diagnosis Date Noted   Hyperlipidemia 05/17/2017   Perimenopausal 02/13/2016   Degenerative joint disease (DJD) of lumbar spine 11/29/2015   Osteopenia 11/29/2015    Vitamin D insufficiency 11/29/2015   Deafness in left ear    Anxiety 04/08/2013   Benign essential tremor 04/08/2013   Leta Speller, MS, OTR/L  Darleene Cleaver, OT 02/02/2021, 6:49 PM  [place] MAIN [facility] SERVICES [address] [place], [place], [address] Phone: [phone]   Fax:  [phone]  Name: LEXXI KOSLOW MRN: [id number] Date of Birth: 11/22/1975

## 2021-02-06 DIAGNOSIS — F322 Major depressive disorder, single episode, severe without psychotic features: Secondary | ICD-10-CM | POA: Diagnosis not present

## 2021-02-07 ENCOUNTER — Ambulatory Visit: Payer: 59

## 2021-02-08 ENCOUNTER — Other Ambulatory Visit (HOSPITAL_COMMUNITY): Payer: Self-pay

## 2021-02-09 ENCOUNTER — Ambulatory Visit: Payer: 59

## 2021-02-14 ENCOUNTER — Ambulatory Visit: Payer: 59

## 2021-02-16 ENCOUNTER — Ambulatory Visit: Payer: 59

## 2021-02-20 ENCOUNTER — Other Ambulatory Visit (HOSPITAL_COMMUNITY): Payer: Self-pay

## 2021-02-20 DIAGNOSIS — F900 Attention-deficit hyperactivity disorder, predominantly inattentive type: Secondary | ICD-10-CM | POA: Diagnosis not present

## 2021-02-20 DIAGNOSIS — F410 Panic disorder [episodic paroxysmal anxiety] without agoraphobia: Secondary | ICD-10-CM | POA: Diagnosis not present

## 2021-02-20 DIAGNOSIS — F332 Major depressive disorder, recurrent severe without psychotic features: Secondary | ICD-10-CM | POA: Diagnosis not present

## 2021-02-20 IMAGING — MG DIGITAL SCREENING BILAT W/ TOMO W/ CAD
8 series · 8 of 24 positions shown · non-contrast
Comparison: Previous exam(s).

CLINICAL DATA: Screening.

EXAM:
DIGITAL SCREENING BILATERAL MAMMOGRAM WITH TOMO AND CAD

[L CC synth-2D]
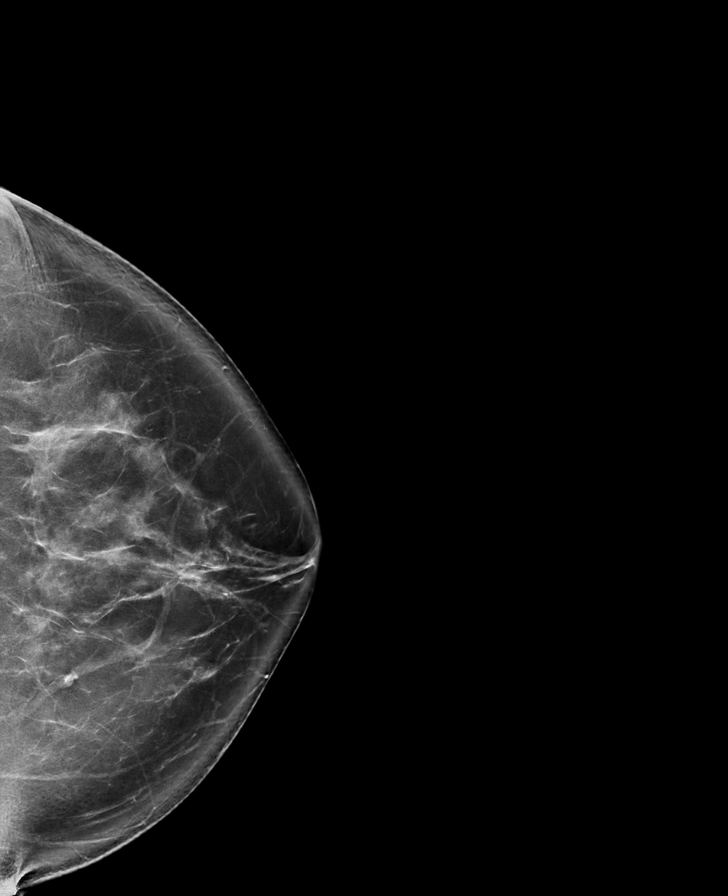

[R MLO synth-2D]
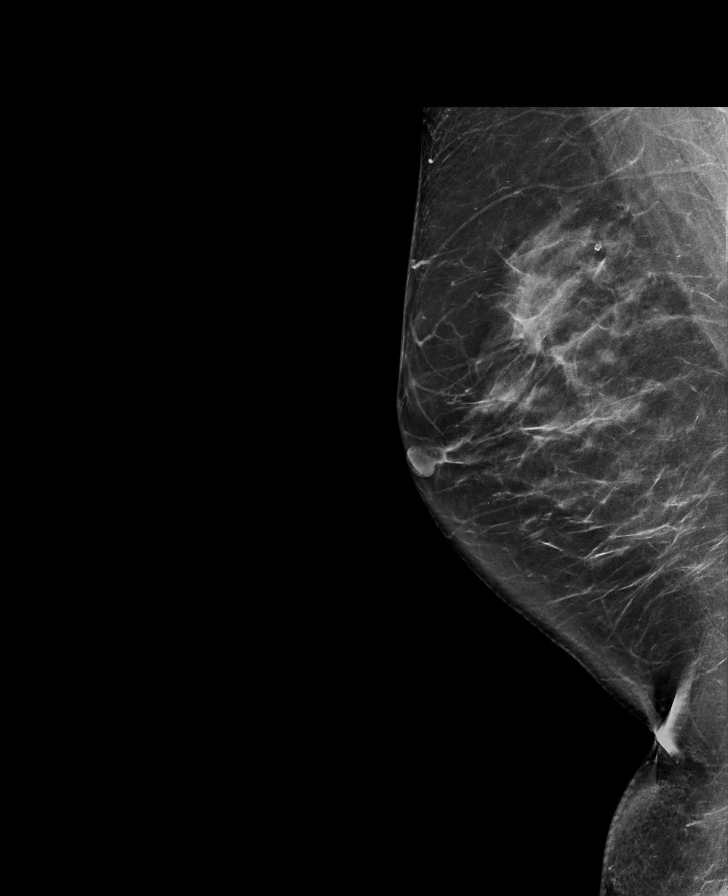

[R CC synth-2D]
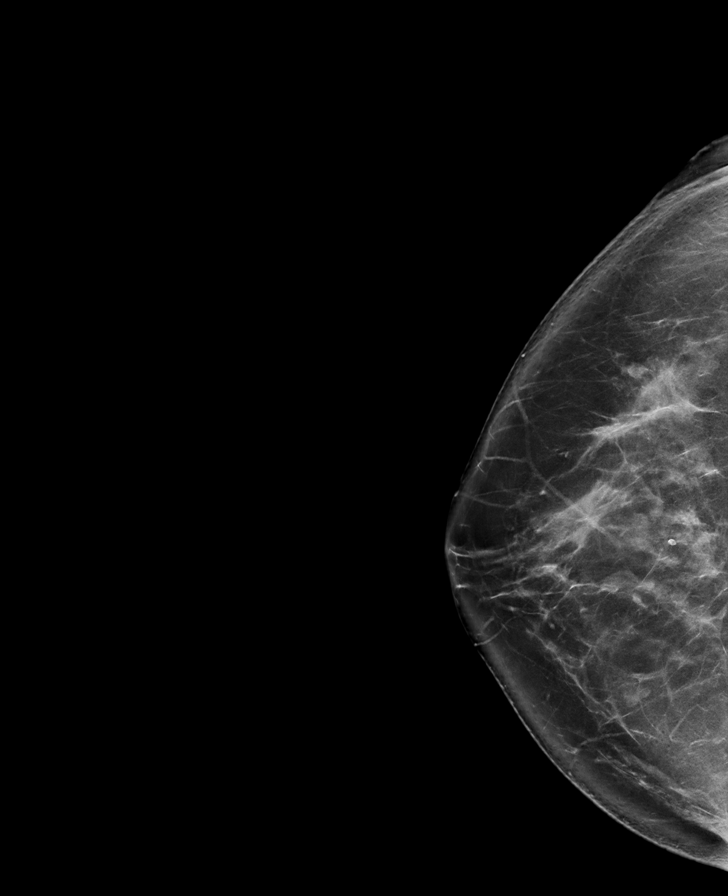

[L MLO synth-2D]
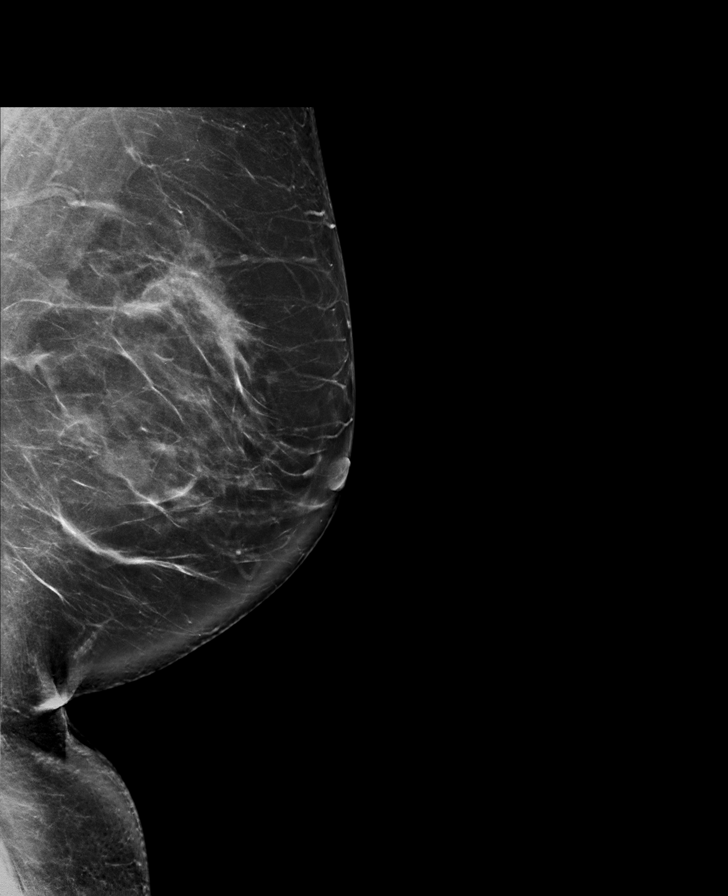

[R CC tomo · tomo slice 47/92.0]
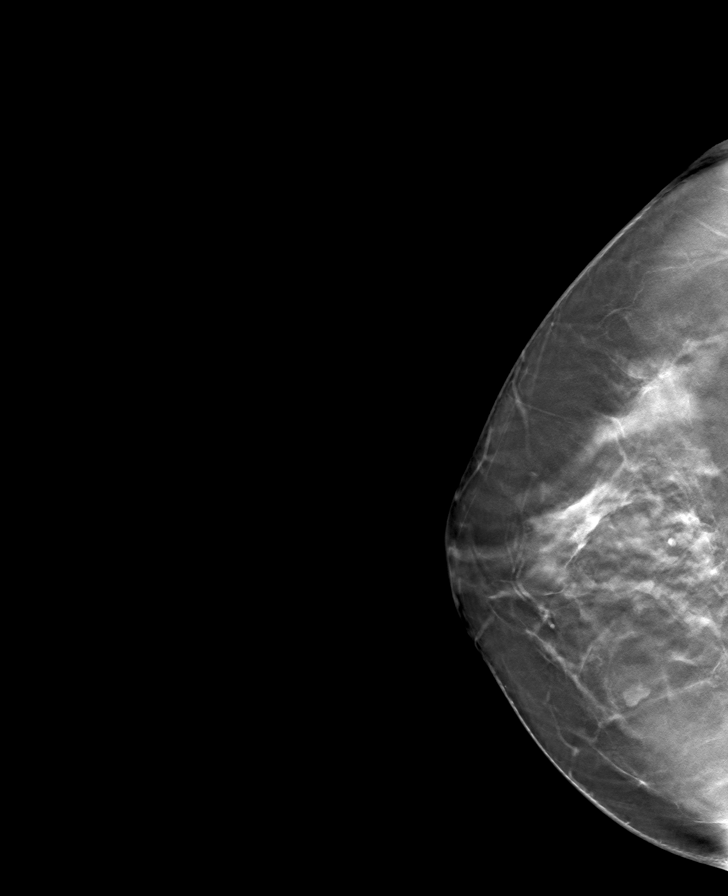

[L CC tomo · tomo slice 50/99.0]
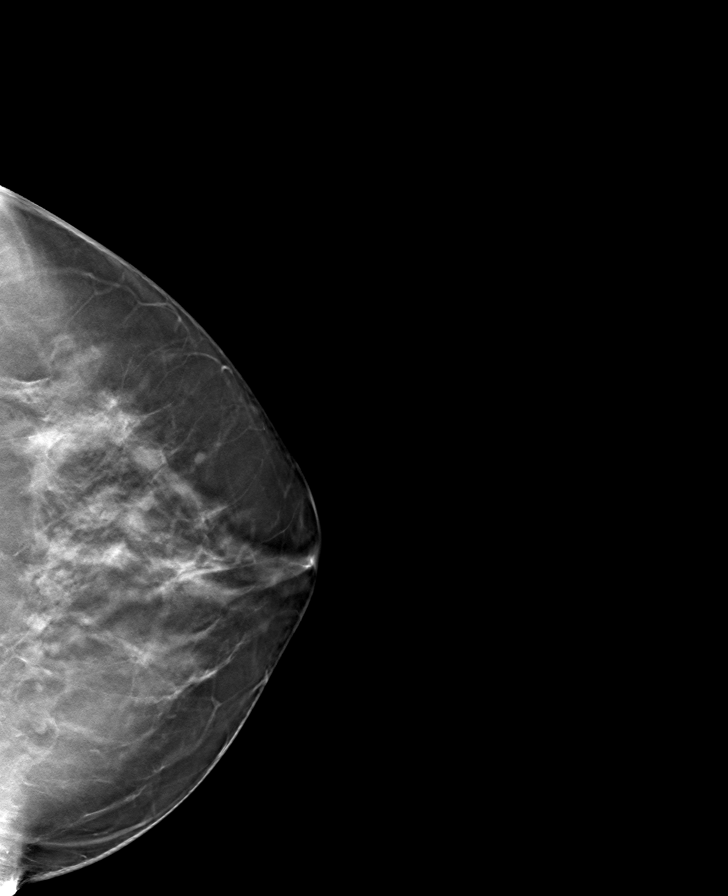

[L MLO tomo · tomo slice 48/95.0]
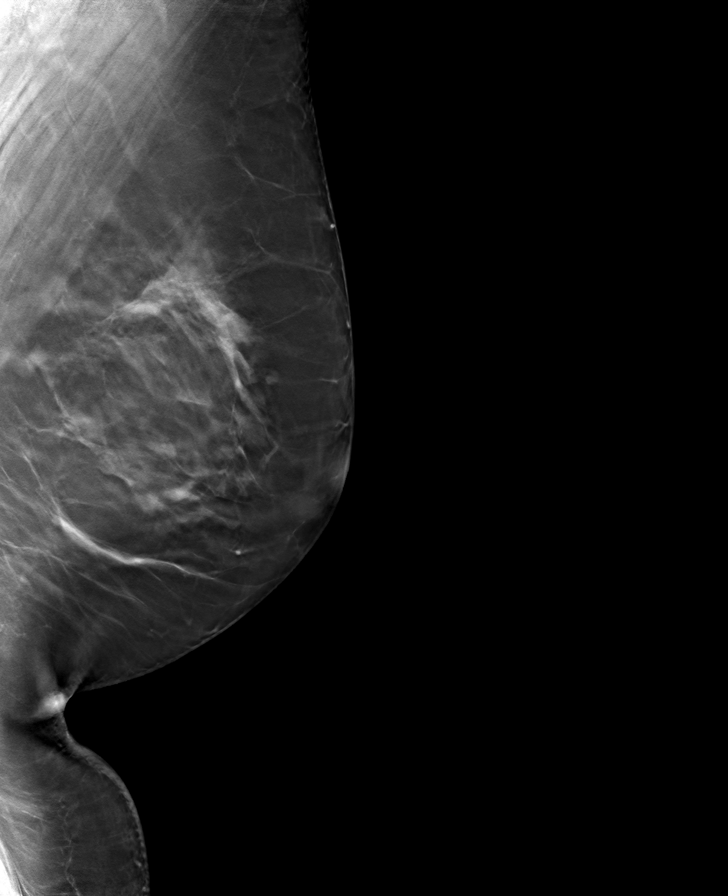

[R MLO tomo · tomo slice 46/91.0]
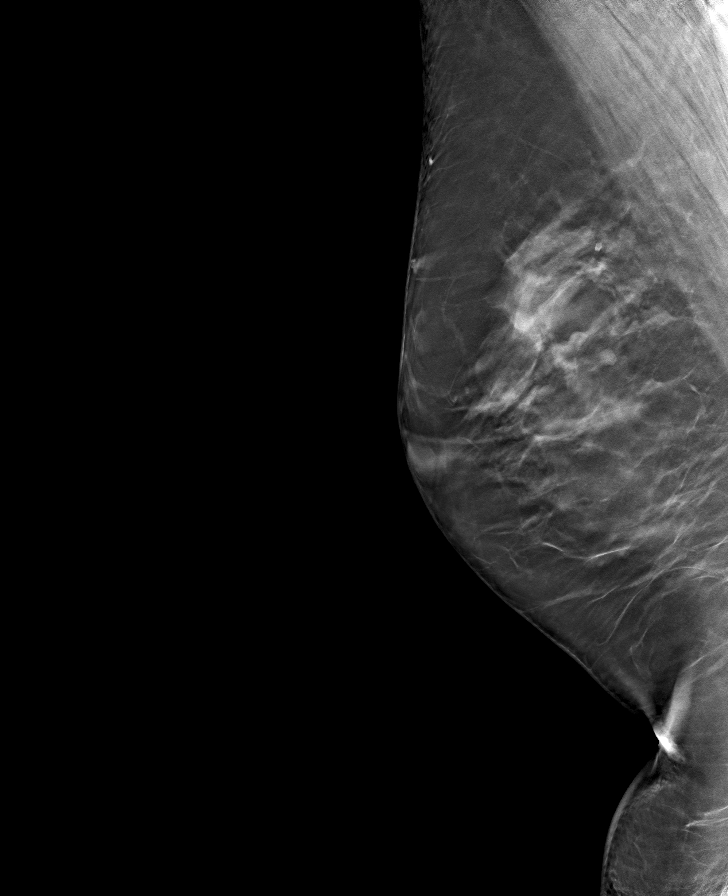

[8 of 24 positions shown; findings below may reference images not displayed]

ACR Breast Density Category c: The breast tissue is heterogeneously
dense, which may obscure small masses.
FINDINGS: There are no findings suspicious for malignancy. Images were
processed with CAD.
IMPRESSION: No mammographic evidence of malignancy. A result letter of this
screening mammogram will be mailed directly to the patient.

RECOMMENDATION:
Screening mammogram in one year. (Code:FT-U-LHB)

BI-RADS CATEGORY  1: Negative.

## 2021-02-20 MED ORDER — REXULTI 3 MG PO TABS
ORAL_TABLET | Freq: Every day | ORAL | 2 refills | Status: AC
  Filled 2021-02-20 – 2021-03-06 (×2): qty 30, 30d supply, fill #0
  Filled 2021-05-01: qty 30, 30d supply, fill #1
  Filled 2021-05-29: qty 30, 30d supply, fill #2

## 2021-02-21 ENCOUNTER — Ambulatory Visit: Payer: 59

## 2021-02-23 ENCOUNTER — Ambulatory Visit: Payer: 59

## 2021-02-27 ENCOUNTER — Other Ambulatory Visit (HOSPITAL_COMMUNITY): Payer: Self-pay

## 2021-03-02 DIAGNOSIS — F322 Major depressive disorder, single episode, severe without psychotic features: Secondary | ICD-10-CM | POA: Diagnosis not present

## 2021-03-06 ENCOUNTER — Other Ambulatory Visit (HOSPITAL_COMMUNITY): Payer: Self-pay

## 2021-03-10 DIAGNOSIS — Z20822 Contact with and (suspected) exposure to covid-19: Secondary | ICD-10-CM | POA: Diagnosis not present

## 2021-03-28 ENCOUNTER — Other Ambulatory Visit (HOSPITAL_COMMUNITY): Payer: Self-pay

## 2021-03-29 DIAGNOSIS — F322 Major depressive disorder, single episode, severe without psychotic features: Secondary | ICD-10-CM | POA: Diagnosis not present

## 2021-04-10 ENCOUNTER — Other Ambulatory Visit (HOSPITAL_COMMUNITY): Payer: Self-pay

## 2021-04-13 DIAGNOSIS — F332 Major depressive disorder, recurrent severe without psychotic features: Secondary | ICD-10-CM | POA: Diagnosis not present

## 2021-04-13 DIAGNOSIS — F410 Panic disorder [episodic paroxysmal anxiety] without agoraphobia: Secondary | ICD-10-CM | POA: Diagnosis not present

## 2021-04-13 DIAGNOSIS — F900 Attention-deficit hyperactivity disorder, predominantly inattentive type: Secondary | ICD-10-CM | POA: Diagnosis not present

## 2021-05-01 ENCOUNTER — Other Ambulatory Visit: Payer: Self-pay | Admitting: Advanced Practice Midwife

## 2021-05-01 ENCOUNTER — Other Ambulatory Visit (HOSPITAL_COMMUNITY): Payer: Self-pay

## 2021-05-01 DIAGNOSIS — N951 Menopausal and female climacteric states: Secondary | ICD-10-CM

## 2021-05-01 DIAGNOSIS — Z01419 Encounter for gynecological examination (general) (routine) without abnormal findings: Secondary | ICD-10-CM

## 2021-05-03 DIAGNOSIS — F332 Major depressive disorder, recurrent severe without psychotic features: Secondary | ICD-10-CM | POA: Diagnosis not present

## 2021-05-03 DIAGNOSIS — F410 Panic disorder [episodic paroxysmal anxiety] without agoraphobia: Secondary | ICD-10-CM | POA: Diagnosis not present

## 2021-05-03 DIAGNOSIS — F322 Major depressive disorder, single episode, severe without psychotic features: Secondary | ICD-10-CM | POA: Diagnosis not present

## 2021-05-04 ENCOUNTER — Other Ambulatory Visit (HOSPITAL_COMMUNITY): Payer: Self-pay

## 2021-05-29 ENCOUNTER — Other Ambulatory Visit (HOSPITAL_COMMUNITY): Payer: Self-pay

## 2021-05-29 DIAGNOSIS — F322 Major depressive disorder, single episode, severe without psychotic features: Secondary | ICD-10-CM | POA: Diagnosis not present

## 2021-06-21 ENCOUNTER — Other Ambulatory Visit (HOSPITAL_COMMUNITY): Payer: Self-pay
